# Patient Record
Sex: Male | Born: 1945 | Race: White | Hispanic: No | Smoking: Former smoker
Health system: Southern US, Community
[De-identification: ages and names within clinical notes are randomized; demographics above are authoritative.]

## PROBLEM LIST (undated history)

## (undated) DIAGNOSIS — M1990 Unspecified osteoarthritis, unspecified site: Secondary | ICD-10-CM

## (undated) DIAGNOSIS — I2510 Atherosclerotic heart disease of native coronary artery without angina pectoris: Secondary | ICD-10-CM

## (undated) DIAGNOSIS — M109 Gout, unspecified: Secondary | ICD-10-CM

## (undated) DIAGNOSIS — I509 Heart failure, unspecified: Secondary | ICD-10-CM

## (undated) DIAGNOSIS — I219 Acute myocardial infarction, unspecified: Secondary | ICD-10-CM

## (undated) DIAGNOSIS — I10 Essential (primary) hypertension: Secondary | ICD-10-CM

## (undated) DIAGNOSIS — E785 Hyperlipidemia, unspecified: Secondary | ICD-10-CM

## (undated) DIAGNOSIS — Z972 Presence of dental prosthetic device (complete) (partial): Secondary | ICD-10-CM

## (undated) DIAGNOSIS — N183 Chronic kidney disease, stage 3 unspecified: Secondary | ICD-10-CM

## (undated) HISTORY — DX: Essential (primary) hypertension: I10

## (undated) HISTORY — DX: Acute myocardial infarction, unspecified: I21.9

## (undated) HISTORY — DX: Hyperlipidemia, unspecified: E78.5

## (undated) HISTORY — PX: JOINT REPLACEMENT: SHX530

---

## 1998-03-15 DIAGNOSIS — I219 Acute myocardial infarction, unspecified: Secondary | ICD-10-CM

## 1998-03-15 HISTORY — DX: Acute myocardial infarction, unspecified: I21.9

## 1998-03-15 HISTORY — PX: CARDIAC CATHETERIZATION: SHX172

## 2001-03-15 DIAGNOSIS — G459 Transient cerebral ischemic attack, unspecified: Secondary | ICD-10-CM

## 2001-03-15 HISTORY — DX: Transient cerebral ischemic attack, unspecified: G45.9

## 2006-04-06 ENCOUNTER — Ambulatory Visit: Payer: Self-pay | Admitting: Gastroenterology

## 2007-10-27 ENCOUNTER — Ambulatory Visit: Payer: Self-pay | Admitting: *Deleted

## 2007-12-20 ENCOUNTER — Encounter: Payer: Self-pay | Admitting: Internal Medicine

## 2008-01-14 ENCOUNTER — Encounter: Payer: Self-pay | Admitting: Internal Medicine

## 2008-02-13 ENCOUNTER — Encounter: Payer: Self-pay | Admitting: Internal Medicine

## 2008-03-15 ENCOUNTER — Encounter: Payer: Self-pay | Admitting: Internal Medicine

## 2009-03-15 HISTORY — PX: COLONOSCOPY: SHX174

## 2011-01-17 ENCOUNTER — Ambulatory Visit: Payer: Self-pay | Admitting: Family Medicine

## 2011-01-17 ENCOUNTER — Ambulatory Visit: Payer: Self-pay | Admitting: Internal Medicine

## 2011-01-22 ENCOUNTER — Ambulatory Visit: Payer: Self-pay | Admitting: Urology

## 2011-01-25 DIAGNOSIS — N183 Chronic kidney disease, stage 3 unspecified: Secondary | ICD-10-CM | POA: Insufficient documentation

## 2011-01-29 ENCOUNTER — Ambulatory Visit: Payer: Self-pay | Admitting: Urology

## 2011-02-01 ENCOUNTER — Ambulatory Visit: Payer: Self-pay | Admitting: Urology

## 2011-02-08 ENCOUNTER — Ambulatory Visit: Payer: Self-pay | Admitting: Urology

## 2011-03-01 ENCOUNTER — Ambulatory Visit: Payer: Self-pay | Admitting: Urology

## 2012-09-19 DIAGNOSIS — L905 Scar conditions and fibrosis of skin: Secondary | ICD-10-CM | POA: Diagnosis not present

## 2012-09-19 DIAGNOSIS — Z85828 Personal history of other malignant neoplasm of skin: Secondary | ICD-10-CM | POA: Diagnosis not present

## 2012-09-29 DIAGNOSIS — G458 Other transient cerebral ischemic attacks and related syndromes: Secondary | ICD-10-CM | POA: Diagnosis not present

## 2012-09-29 DIAGNOSIS — R011 Cardiac murmur, unspecified: Secondary | ICD-10-CM | POA: Diagnosis not present

## 2012-09-29 DIAGNOSIS — I209 Angina pectoris, unspecified: Secondary | ICD-10-CM | POA: Diagnosis not present

## 2012-09-29 DIAGNOSIS — I2510 Atherosclerotic heart disease of native coronary artery without angina pectoris: Secondary | ICD-10-CM | POA: Diagnosis not present

## 2012-12-01 DIAGNOSIS — E669 Obesity, unspecified: Secondary | ICD-10-CM | POA: Diagnosis not present

## 2012-12-01 DIAGNOSIS — Z0000 Encounter for general adult medical examination without abnormal findings: Secondary | ICD-10-CM | POA: Diagnosis not present

## 2012-12-01 DIAGNOSIS — E785 Hyperlipidemia, unspecified: Secondary | ICD-10-CM | POA: Diagnosis not present

## 2012-12-01 DIAGNOSIS — I10 Essential (primary) hypertension: Secondary | ICD-10-CM | POA: Diagnosis not present

## 2012-12-01 DIAGNOSIS — Z23 Encounter for immunization: Secondary | ICD-10-CM | POA: Diagnosis not present

## 2012-12-01 DIAGNOSIS — N400 Enlarged prostate without lower urinary tract symptoms: Secondary | ICD-10-CM | POA: Diagnosis not present

## 2012-12-01 DIAGNOSIS — Z79899 Other long term (current) drug therapy: Secondary | ICD-10-CM | POA: Diagnosis not present

## 2013-03-06 DIAGNOSIS — M1710 Unilateral primary osteoarthritis, unspecified knee: Secondary | ICD-10-CM | POA: Diagnosis not present

## 2013-03-19 DIAGNOSIS — Z85828 Personal history of other malignant neoplasm of skin: Secondary | ICD-10-CM | POA: Diagnosis not present

## 2013-03-19 DIAGNOSIS — Z1283 Encounter for screening for malignant neoplasm of skin: Secondary | ICD-10-CM | POA: Diagnosis not present

## 2013-03-19 DIAGNOSIS — C44319 Basal cell carcinoma of skin of other parts of face: Secondary | ICD-10-CM | POA: Diagnosis not present

## 2013-03-19 DIAGNOSIS — D485 Neoplasm of uncertain behavior of skin: Secondary | ICD-10-CM | POA: Diagnosis not present

## 2013-03-19 DIAGNOSIS — L570 Actinic keratosis: Secondary | ICD-10-CM | POA: Diagnosis not present

## 2013-04-11 DIAGNOSIS — C44319 Basal cell carcinoma of skin of other parts of face: Secondary | ICD-10-CM | POA: Diagnosis not present

## 2013-04-11 DIAGNOSIS — C4401 Basal cell carcinoma of skin of lip: Secondary | ICD-10-CM | POA: Diagnosis not present

## 2013-05-30 DIAGNOSIS — Z48817 Encounter for surgical aftercare following surgery on the skin and subcutaneous tissue: Secondary | ICD-10-CM | POA: Diagnosis not present

## 2013-05-30 DIAGNOSIS — L570 Actinic keratosis: Secondary | ICD-10-CM | POA: Diagnosis not present

## 2013-06-28 DIAGNOSIS — J019 Acute sinusitis, unspecified: Secondary | ICD-10-CM | POA: Diagnosis not present

## 2013-09-07 ENCOUNTER — Ambulatory Visit: Payer: Self-pay | Admitting: Urology

## 2013-09-07 DIAGNOSIS — E291 Testicular hypofunction: Secondary | ICD-10-CM | POA: Diagnosis not present

## 2013-09-07 DIAGNOSIS — N400 Enlarged prostate without lower urinary tract symptoms: Secondary | ICD-10-CM | POA: Diagnosis not present

## 2013-09-07 DIAGNOSIS — N200 Calculus of kidney: Secondary | ICD-10-CM | POA: Diagnosis not present

## 2013-09-07 DIAGNOSIS — Z87442 Personal history of urinary calculi: Secondary | ICD-10-CM | POA: Diagnosis not present

## 2013-09-07 DIAGNOSIS — R109 Unspecified abdominal pain: Secondary | ICD-10-CM | POA: Diagnosis not present

## 2013-09-13 DIAGNOSIS — E291 Testicular hypofunction: Secondary | ICD-10-CM | POA: Diagnosis not present

## 2013-09-13 DIAGNOSIS — N200 Calculus of kidney: Secondary | ICD-10-CM | POA: Diagnosis not present

## 2013-09-28 DIAGNOSIS — I10 Essential (primary) hypertension: Secondary | ICD-10-CM | POA: Diagnosis not present

## 2013-09-28 DIAGNOSIS — I2510 Atherosclerotic heart disease of native coronary artery without angina pectoris: Secondary | ICD-10-CM | POA: Diagnosis not present

## 2013-09-28 DIAGNOSIS — I739 Peripheral vascular disease, unspecified: Secondary | ICD-10-CM | POA: Diagnosis not present

## 2013-09-28 DIAGNOSIS — E669 Obesity, unspecified: Secondary | ICD-10-CM | POA: Diagnosis not present

## 2013-10-11 DIAGNOSIS — E291 Testicular hypofunction: Secondary | ICD-10-CM | POA: Diagnosis not present

## 2013-11-09 DIAGNOSIS — E291 Testicular hypofunction: Secondary | ICD-10-CM | POA: Diagnosis not present

## 2013-11-24 DIAGNOSIS — Z23 Encounter for immunization: Secondary | ICD-10-CM | POA: Diagnosis not present

## 2014-02-12 DIAGNOSIS — M25561 Pain in right knee: Secondary | ICD-10-CM | POA: Diagnosis not present

## 2014-02-12 DIAGNOSIS — M1711 Unilateral primary osteoarthritis, right knee: Secondary | ICD-10-CM | POA: Diagnosis not present

## 2014-02-15 DIAGNOSIS — I25810 Atherosclerosis of coronary artery bypass graft(s) without angina pectoris: Secondary | ICD-10-CM | POA: Diagnosis not present

## 2014-02-15 DIAGNOSIS — I10 Essential (primary) hypertension: Secondary | ICD-10-CM | POA: Diagnosis not present

## 2014-02-15 DIAGNOSIS — E784 Other hyperlipidemia: Secondary | ICD-10-CM | POA: Diagnosis not present

## 2014-02-22 DIAGNOSIS — I25719 Atherosclerosis of autologous vein coronary artery bypass graft(s) with unspecified angina pectoris: Secondary | ICD-10-CM | POA: Diagnosis not present

## 2014-02-22 DIAGNOSIS — G459 Transient cerebral ischemic attack, unspecified: Secondary | ICD-10-CM | POA: Diagnosis not present

## 2014-03-15 HISTORY — PX: KNEE ARTHROSCOPY: SHX127

## 2014-03-26 DIAGNOSIS — M1711 Unilateral primary osteoarthritis, right knee: Secondary | ICD-10-CM | POA: Insufficient documentation

## 2014-03-27 ENCOUNTER — Ambulatory Visit: Payer: Self-pay | Admitting: General Practice

## 2014-03-27 DIAGNOSIS — M1711 Unilateral primary osteoarthritis, right knee: Secondary | ICD-10-CM | POA: Diagnosis not present

## 2014-03-27 DIAGNOSIS — Z01812 Encounter for preprocedural laboratory examination: Secondary | ICD-10-CM | POA: Diagnosis not present

## 2014-03-27 LAB — BASIC METABOLIC PANEL
Anion Gap: 5 — ABNORMAL LOW (ref 7–16)
BUN: 25 mg/dL — ABNORMAL HIGH (ref 7–18)
CHLORIDE: 111 mmol/L — AB (ref 98–107)
CO2: 27 mmol/L (ref 21–32)
CREATININE: 2.14 mg/dL — AB (ref 0.60–1.30)
Calcium, Total: 8.7 mg/dL (ref 8.5–10.1)
EGFR (Non-African Amer.): 33 — ABNORMAL LOW
GFR CALC AF AMER: 40 — AB
Glucose: 92 mg/dL (ref 65–99)
OSMOLALITY: 289 (ref 275–301)
Potassium: 5 mmol/L (ref 3.5–5.1)
Sodium: 143 mmol/L (ref 136–145)

## 2014-03-27 LAB — URINALYSIS, COMPLETE
Bacteria: NONE SEEN
Bilirubin,UR: NEGATIVE
Blood: NEGATIVE
Glucose,UR: NEGATIVE mg/dL (ref 0–75)
KETONE: NEGATIVE
Leukocyte Esterase: NEGATIVE
Nitrite: NEGATIVE
PROTEIN: NEGATIVE
Ph: 5 (ref 4.5–8.0)
RBC,UR: 1 /HPF (ref 0–5)
SPECIFIC GRAVITY: 1.014 (ref 1.003–1.030)
SQUAMOUS EPITHELIAL: NONE SEEN

## 2014-03-27 LAB — CBC
HCT: 47.3 % (ref 40.0–52.0)
HGB: 15.3 g/dL (ref 13.0–18.0)
MCH: 29.2 pg (ref 26.0–34.0)
MCHC: 32.3 g/dL (ref 32.0–36.0)
MCV: 91 fL (ref 80–100)
Platelet: 206 10*3/uL (ref 150–440)
RBC: 5.23 10*6/uL (ref 4.40–5.90)
RDW: 13.9 % (ref 11.5–14.5)
WBC: 6.9 10*3/uL (ref 3.8–10.6)

## 2014-03-27 LAB — SEDIMENTATION RATE: ERYTHROCYTE SED RATE: 7 mm/h (ref 0–20)

## 2014-03-27 LAB — APTT: ACTIVATED PTT: 27.7 s (ref 23.6–35.9)

## 2014-03-27 LAB — PROTIME-INR
INR: 1
Prothrombin Time: 13.3 secs (ref 11.5–14.7)

## 2014-03-27 LAB — MRSA PCR SCREENING

## 2014-03-29 LAB — URINE CULTURE

## 2014-04-09 DIAGNOSIS — Z955 Presence of coronary angioplasty implant and graft: Secondary | ICD-10-CM | POA: Insufficient documentation

## 2014-04-09 DIAGNOSIS — N183 Chronic kidney disease, stage 3 (moderate): Secondary | ICD-10-CM | POA: Diagnosis not present

## 2014-04-09 DIAGNOSIS — G4733 Obstructive sleep apnea (adult) (pediatric): Secondary | ICD-10-CM | POA: Diagnosis not present

## 2014-04-09 DIAGNOSIS — N200 Calculus of kidney: Secondary | ICD-10-CM | POA: Diagnosis not present

## 2014-05-21 ENCOUNTER — Ambulatory Visit: Payer: Self-pay | Admitting: General Practice

## 2014-05-21 DIAGNOSIS — Z01812 Encounter for preprocedural laboratory examination: Secondary | ICD-10-CM | POA: Diagnosis not present

## 2014-05-21 DIAGNOSIS — M1711 Unilateral primary osteoarthritis, right knee: Secondary | ICD-10-CM | POA: Diagnosis not present

## 2014-05-21 DIAGNOSIS — Z79899 Other long term (current) drug therapy: Secondary | ICD-10-CM | POA: Diagnosis not present

## 2014-05-21 DIAGNOSIS — M79661 Pain in right lower leg: Secondary | ICD-10-CM | POA: Diagnosis not present

## 2014-05-21 DIAGNOSIS — E669 Obesity, unspecified: Secondary | ICD-10-CM | POA: Diagnosis not present

## 2014-06-05 ENCOUNTER — Inpatient Hospital Stay: Payer: Self-pay | Admitting: General Practice

## 2014-06-05 DIAGNOSIS — Z7902 Long term (current) use of antithrombotics/antiplatelets: Secondary | ICD-10-CM | POA: Diagnosis not present

## 2014-06-05 DIAGNOSIS — M109 Gout, unspecified: Secondary | ICD-10-CM | POA: Diagnosis not present

## 2014-06-05 DIAGNOSIS — N183 Chronic kidney disease, stage 3 (moderate): Secondary | ICD-10-CM | POA: Diagnosis present

## 2014-06-05 DIAGNOSIS — Z7982 Long term (current) use of aspirin: Secondary | ICD-10-CM | POA: Diagnosis not present

## 2014-06-05 DIAGNOSIS — I4891 Unspecified atrial fibrillation: Secondary | ICD-10-CM | POA: Diagnosis not present

## 2014-06-05 DIAGNOSIS — I739 Peripheral vascular disease, unspecified: Secondary | ICD-10-CM | POA: Diagnosis not present

## 2014-06-05 DIAGNOSIS — Z955 Presence of coronary angioplasty implant and graft: Secondary | ICD-10-CM | POA: Diagnosis not present

## 2014-06-05 DIAGNOSIS — I493 Ventricular premature depolarization: Secondary | ICD-10-CM | POA: Diagnosis not present

## 2014-06-05 DIAGNOSIS — Z85828 Personal history of other malignant neoplasm of skin: Secondary | ICD-10-CM | POA: Diagnosis not present

## 2014-06-05 DIAGNOSIS — Z471 Aftercare following joint replacement surgery: Secondary | ICD-10-CM | POA: Diagnosis not present

## 2014-06-05 DIAGNOSIS — R0902 Hypoxemia: Secondary | ICD-10-CM | POA: Diagnosis not present

## 2014-06-05 DIAGNOSIS — M1711 Unilateral primary osteoarthritis, right knee: Secondary | ICD-10-CM | POA: Diagnosis not present

## 2014-06-05 DIAGNOSIS — D62 Acute posthemorrhagic anemia: Secondary | ICD-10-CM | POA: Diagnosis not present

## 2014-06-05 DIAGNOSIS — Z8673 Personal history of transient ischemic attack (TIA), and cerebral infarction without residual deficits: Secondary | ICD-10-CM | POA: Diagnosis not present

## 2014-06-05 DIAGNOSIS — I252 Old myocardial infarction: Secondary | ICD-10-CM | POA: Diagnosis not present

## 2014-06-05 DIAGNOSIS — G4733 Obstructive sleep apnea (adult) (pediatric): Secondary | ICD-10-CM | POA: Diagnosis not present

## 2014-06-05 DIAGNOSIS — I9789 Other postprocedural complications and disorders of the circulatory system, not elsewhere classified: Secondary | ICD-10-CM | POA: Diagnosis not present

## 2014-06-05 DIAGNOSIS — M21161 Varus deformity, not elsewhere classified, right knee: Secondary | ICD-10-CM | POA: Diagnosis present

## 2014-06-05 DIAGNOSIS — I10 Essential (primary) hypertension: Secondary | ICD-10-CM | POA: Diagnosis not present

## 2014-06-05 DIAGNOSIS — N23 Unspecified renal colic: Secondary | ICD-10-CM | POA: Diagnosis not present

## 2014-06-05 DIAGNOSIS — E669 Obesity, unspecified: Secondary | ICD-10-CM | POA: Diagnosis present

## 2014-06-05 DIAGNOSIS — K469 Unspecified abdominal hernia without obstruction or gangrene: Secondary | ICD-10-CM | POA: Diagnosis not present

## 2014-06-05 DIAGNOSIS — M6281 Muscle weakness (generalized): Secondary | ICD-10-CM | POA: Diagnosis not present

## 2014-06-05 DIAGNOSIS — R531 Weakness: Secondary | ICD-10-CM | POA: Diagnosis not present

## 2014-06-05 DIAGNOSIS — I2510 Atherosclerotic heart disease of native coronary artery without angina pectoris: Secondary | ICD-10-CM | POA: Diagnosis not present

## 2014-06-05 DIAGNOSIS — R2689 Other abnormalities of gait and mobility: Secondary | ICD-10-CM | POA: Diagnosis not present

## 2014-06-05 DIAGNOSIS — E785 Hyperlipidemia, unspecified: Secondary | ICD-10-CM | POA: Diagnosis not present

## 2014-06-05 DIAGNOSIS — Z6839 Body mass index (BMI) 39.0-39.9, adult: Secondary | ICD-10-CM | POA: Diagnosis not present

## 2014-06-05 DIAGNOSIS — Z96651 Presence of right artificial knee joint: Secondary | ICD-10-CM | POA: Diagnosis not present

## 2014-06-06 ENCOUNTER — Encounter
Admit: 2014-06-06 | Disposition: A | Discharge status: Home / Self Care | Payer: Self-pay | Attending: Internal Medicine | Admitting: Internal Medicine

## 2014-06-06 LAB — BASIC METABOLIC PANEL
ANION GAP: 6 — AB (ref 7–16)
BUN: 25 mg/dL — ABNORMAL HIGH
CREATININE: 1.6 mg/dL — AB
Calcium, Total: 8.4 mg/dL — ABNORMAL LOW
Chloride: 110 mmol/L
Co2: 25 mmol/L
GFR CALC AF AMER: 51 — AB
GFR CALC NON AF AMER: 44 — AB
Glucose: 121 mg/dL — ABNORMAL HIGH
Potassium: 4 mmol/L
Sodium: 141 mmol/L

## 2014-06-06 LAB — PLATELET COUNT: Platelet: 228 10*3/uL (ref 150–440)

## 2014-06-06 LAB — HEMOGLOBIN: HGB: 12.3 g/dL — ABNORMAL LOW (ref 13.0–18.0)

## 2014-06-07 LAB — BASIC METABOLIC PANEL WITH GFR
Anion Gap: 8
BUN: 19 mg/dL
Calcium, Total: 8.5 mg/dL — ABNORMAL LOW
Chloride: 105 mmol/L
Co2: 24 mmol/L
Creatinine: 1.63 mg/dL — ABNORMAL HIGH
EGFR (African American): 50 — ABNORMAL LOW
EGFR (Non-African Amer.): 43 — ABNORMAL LOW
Glucose: 102 mg/dL — ABNORMAL HIGH
Potassium: 3.9 mmol/L
Sodium: 137 mmol/L

## 2014-06-07 LAB — HEMOGLOBIN: HGB: 11.3 g/dL — AB (ref 13.0–18.0)

## 2014-06-07 LAB — PLATELET COUNT: Platelet: 199 x10 3/mm 3

## 2014-06-08 DIAGNOSIS — I2510 Atherosclerotic heart disease of native coronary artery without angina pectoris: Secondary | ICD-10-CM | POA: Diagnosis not present

## 2014-06-08 DIAGNOSIS — M109 Gout, unspecified: Secondary | ICD-10-CM | POA: Diagnosis not present

## 2014-06-08 DIAGNOSIS — R2689 Other abnormalities of gait and mobility: Secondary | ICD-10-CM | POA: Diagnosis not present

## 2014-06-08 DIAGNOSIS — Z471 Aftercare following joint replacement surgery: Secondary | ICD-10-CM | POA: Diagnosis not present

## 2014-06-08 DIAGNOSIS — G4733 Obstructive sleep apnea (adult) (pediatric): Secondary | ICD-10-CM | POA: Diagnosis not present

## 2014-06-08 DIAGNOSIS — I10 Essential (primary) hypertension: Secondary | ICD-10-CM | POA: Diagnosis not present

## 2014-06-08 DIAGNOSIS — Z7902 Long term (current) use of antithrombotics/antiplatelets: Secondary | ICD-10-CM | POA: Diagnosis not present

## 2014-06-08 DIAGNOSIS — Z96651 Presence of right artificial knee joint: Secondary | ICD-10-CM | POA: Diagnosis not present

## 2014-06-08 DIAGNOSIS — I493 Ventricular premature depolarization: Secondary | ICD-10-CM | POA: Diagnosis not present

## 2014-06-08 DIAGNOSIS — R0902 Hypoxemia: Secondary | ICD-10-CM | POA: Diagnosis not present

## 2014-06-08 DIAGNOSIS — N23 Unspecified renal colic: Secondary | ICD-10-CM | POA: Diagnosis not present

## 2014-06-08 DIAGNOSIS — M159 Polyosteoarthritis, unspecified: Secondary | ICD-10-CM | POA: Diagnosis not present

## 2014-06-08 DIAGNOSIS — R531 Weakness: Secondary | ICD-10-CM | POA: Diagnosis not present

## 2014-06-08 DIAGNOSIS — N179 Acute kidney failure, unspecified: Secondary | ICD-10-CM | POA: Diagnosis not present

## 2014-06-08 DIAGNOSIS — K469 Unspecified abdominal hernia without obstruction or gangrene: Secondary | ICD-10-CM | POA: Diagnosis not present

## 2014-06-08 DIAGNOSIS — Z8673 Personal history of transient ischemic attack (TIA), and cerebral infarction without residual deficits: Secondary | ICD-10-CM | POA: Diagnosis not present

## 2014-06-08 DIAGNOSIS — M6281 Muscle weakness (generalized): Secondary | ICD-10-CM | POA: Diagnosis not present

## 2014-06-08 DIAGNOSIS — I739 Peripheral vascular disease, unspecified: Secondary | ICD-10-CM | POA: Diagnosis not present

## 2014-06-08 DIAGNOSIS — I4891 Unspecified atrial fibrillation: Secondary | ICD-10-CM | POA: Diagnosis not present

## 2014-06-08 DIAGNOSIS — R000 Tachycardia, unspecified: Secondary | ICD-10-CM | POA: Diagnosis not present

## 2014-06-08 DIAGNOSIS — I252 Old myocardial infarction: Secondary | ICD-10-CM | POA: Diagnosis not present

## 2014-06-08 DIAGNOSIS — E785 Hyperlipidemia, unspecified: Secondary | ICD-10-CM | POA: Diagnosis not present

## 2014-06-08 DIAGNOSIS — R509 Fever, unspecified: Secondary | ICD-10-CM | POA: Diagnosis not present

## 2014-06-10 DIAGNOSIS — M109 Gout, unspecified: Secondary | ICD-10-CM | POA: Diagnosis not present

## 2014-06-10 DIAGNOSIS — N179 Acute kidney failure, unspecified: Secondary | ICD-10-CM | POA: Diagnosis not present

## 2014-06-10 DIAGNOSIS — I2510 Atherosclerotic heart disease of native coronary artery without angina pectoris: Secondary | ICD-10-CM | POA: Diagnosis not present

## 2014-06-10 DIAGNOSIS — M159 Polyosteoarthritis, unspecified: Secondary | ICD-10-CM | POA: Diagnosis not present

## 2014-06-11 LAB — CBC WITH DIFFERENTIAL/PLATELET
Basophil #: 0 10*3/uL (ref 0.0–0.1)
Basophil %: 0.4 %
EOS PCT: 0.4 %
Eosinophil #: 0 10*3/uL (ref 0.0–0.7)
HCT: 28.9 % — ABNORMAL LOW (ref 40.0–52.0)
HGB: 9.3 g/dL — AB (ref 13.0–18.0)
Lymphocyte #: 1.4 10*3/uL (ref 1.0–3.6)
Lymphocyte %: 12.8 %
MCH: 28.8 pg (ref 26.0–34.0)
MCHC: 32.3 g/dL (ref 32.0–36.0)
MCV: 89 fL (ref 80–100)
MONO ABS: 1.9 x10 3/mm — AB (ref 0.2–1.0)
MONOS PCT: 16.4 %
NEUTROS PCT: 70 %
Neutrophil #: 7.9 10*3/uL — ABNORMAL HIGH (ref 1.4–6.5)
Platelet: 247 10*3/uL (ref 150–440)
RBC: 3.24 10*6/uL — AB (ref 4.40–5.90)
RDW: 13.3 % (ref 11.5–14.5)
WBC: 11.3 10*3/uL — ABNORMAL HIGH (ref 3.8–10.6)

## 2014-06-11 LAB — URINALYSIS, COMPLETE
BLOOD: NEGATIVE
Bilirubin,UR: NEGATIVE
GLUCOSE, UR: NEGATIVE mg/dL (ref 0–75)
Ketone: NEGATIVE
Leukocyte Esterase: NEGATIVE
Nitrite: NEGATIVE
PROTEIN: NEGATIVE
Ph: 5 (ref 4.5–8.0)
SPECIFIC GRAVITY: 1.017 (ref 1.003–1.030)
WBC UR: 2 /HPF (ref 0–5)

## 2014-06-12 LAB — COMPREHENSIVE METABOLIC PANEL
ALT: 25 U/L
AST: 37 U/L
Albumin: 2.4 g/dL — ABNORMAL LOW
Alkaline Phosphatase: 69 U/L
Anion Gap: 15 (ref 7–16)
BUN: 88 mg/dL — AB
Bilirubin,Total: 1.1 mg/dL
CALCIUM: 8.4 mg/dL — AB
Chloride: 100 mmol/L — ABNORMAL LOW
Co2: 19 mmol/L — ABNORMAL LOW
Creatinine: 4.2 mg/dL — ABNORMAL HIGH
EGFR (African American): 16 — ABNORMAL LOW
EGFR (Non-African Amer.): 14 — ABNORMAL LOW
GLUCOSE: 118 mg/dL — AB
POTASSIUM: 2.9 mmol/L — AB
SODIUM: 134 mmol/L — AB
Total Protein: 6.7 g/dL

## 2014-06-12 LAB — CBC WITH DIFFERENTIAL/PLATELET
BASOS PCT: 0.3 %
Basophil #: 0 10*3/uL (ref 0.0–0.1)
EOS ABS: 0.1 10*3/uL (ref 0.0–0.7)
EOS PCT: 1.3 %
HCT: 29.8 % — AB (ref 40.0–52.0)
HGB: 9.7 g/dL — AB (ref 13.0–18.0)
LYMPHS PCT: 11.8 %
Lymphocyte #: 1.3 10*3/uL (ref 1.0–3.6)
MCH: 28.8 pg (ref 26.0–34.0)
MCHC: 32.7 g/dL (ref 32.0–36.0)
MCV: 88 fL (ref 80–100)
Monocyte #: 1.4 x10 3/mm — ABNORMAL HIGH (ref 0.2–1.0)
Monocyte %: 13 %
NEUTROS ABS: 7.9 10*3/uL — AB (ref 1.4–6.5)
Neutrophil %: 73.6 %
Platelet: 294 10*3/uL (ref 150–440)
RBC: 3.38 10*6/uL — AB (ref 4.40–5.90)
RDW: 13.4 % (ref 11.5–14.5)
WBC: 10.8 10*3/uL — ABNORMAL HIGH (ref 3.8–10.6)

## 2014-06-12 LAB — URINE CULTURE

## 2014-06-12 LAB — MAGNESIUM: Magnesium: 2.6 mg/dL — ABNORMAL HIGH

## 2014-06-12 LAB — TSH: THYROID STIMULATING HORM: 3.423 u[IU]/mL

## 2014-06-12 LAB — URIC ACID: Uric Acid: 14.1 mg/dL — ABNORMAL HIGH

## 2014-06-13 LAB — COMPREHENSIVE METABOLIC PANEL
ALBUMIN: 2.4 g/dL — AB
ALK PHOS: 70 U/L
AST: 28 U/L
Anion Gap: 13 (ref 7–16)
BUN: 92 mg/dL — ABNORMAL HIGH
Bilirubin,Total: 0.7 mg/dL
Calcium, Total: 8.3 mg/dL — ABNORMAL LOW
Chloride: 102 mmol/L
Co2: 20 mmol/L — ABNORMAL LOW
Creatinine: 3.29 mg/dL — ABNORMAL HIGH
EGFR (African American): 21 — ABNORMAL LOW
EGFR (Non-African Amer.): 19 — ABNORMAL LOW
Glucose: 163 mg/dL — ABNORMAL HIGH
Potassium: 2.9 mmol/L — ABNORMAL LOW
SGPT (ALT): 23 U/L
Sodium: 135 mmol/L
Total Protein: 6.7 g/dL

## 2014-06-13 LAB — CBC WITH DIFFERENTIAL/PLATELET
BASOS PCT: 0.1 %
Basophil #: 0 10*3/uL (ref 0.0–0.1)
EOS PCT: 0.1 %
Eosinophil #: 0 10*3/uL (ref 0.0–0.7)
HCT: 29.6 % — ABNORMAL LOW (ref 40.0–52.0)
HGB: 9.8 g/dL — AB (ref 13.0–18.0)
LYMPHS ABS: 0.8 10*3/uL — AB (ref 1.0–3.6)
Lymphocyte %: 8.5 %
MCH: 29.3 pg (ref 26.0–34.0)
MCHC: 33.2 g/dL (ref 32.0–36.0)
MCV: 88 fL (ref 80–100)
Monocyte #: 0.6 x10 3/mm (ref 0.2–1.0)
Monocyte %: 6.8 %
Neutrophil #: 7.6 10*3/uL — ABNORMAL HIGH (ref 1.4–6.5)
Neutrophil %: 84.5 %
Platelet: 329 10*3/uL (ref 150–440)
RBC: 3.35 10*6/uL — ABNORMAL LOW (ref 4.40–5.90)
RDW: 13.3 % (ref 11.5–14.5)
WBC: 9 10*3/uL (ref 3.8–10.6)

## 2014-06-13 LAB — BASIC METABOLIC PANEL
Anion Gap: 12 (ref 7–16)
BUN: 88 mg/dL — ABNORMAL HIGH
CALCIUM: 8.4 mg/dL — AB
CREATININE: 3.02 mg/dL — AB
Chloride: 104 mmol/L
Co2: 20 mmol/L — ABNORMAL LOW
EGFR (Non-African Amer.): 21 — ABNORMAL LOW
GFR CALC AF AMER: 24 — AB
GLUCOSE: 129 mg/dL — AB
Potassium: 2.9 mmol/L — ABNORMAL LOW
SODIUM: 136 mmol/L

## 2014-06-14 ENCOUNTER — Encounter
Admit: 2014-06-14 | Disposition: A | Discharge status: Home / Self Care | Payer: Self-pay | Attending: Internal Medicine | Admitting: Internal Medicine

## 2014-06-14 LAB — BASIC METABOLIC PANEL
ANION GAP: 10 (ref 7–16)
BUN: 86 mg/dL — ABNORMAL HIGH
CALCIUM: 8.5 mg/dL — AB
CO2: 19 mmol/L — AB
Chloride: 110 mmol/L
Creatinine: 2.56 mg/dL — ABNORMAL HIGH
EGFR (Non-African Amer.): 25 — ABNORMAL LOW
GFR CALC AF AMER: 29 — AB
Glucose: 145 mg/dL — ABNORMAL HIGH
Potassium: 3 mmol/L — ABNORMAL LOW
SODIUM: 139 mmol/L

## 2014-06-18 LAB — BASIC METABOLIC PANEL
ANION GAP: 8 (ref 7–16)
BUN: 48 mg/dL — ABNORMAL HIGH
CALCIUM: 8.8 mg/dL — AB
CHLORIDE: 114 mmol/L — AB
Co2: 19 mmol/L — ABNORMAL LOW
Creatinine: 1.91 mg/dL — ABNORMAL HIGH
GFR CALC AF AMER: 41 — AB
GFR CALC NON AF AMER: 36 — AB
Glucose: 94 mg/dL
Potassium: 4.1 mmol/L
Sodium: 141 mmol/L

## 2014-06-20 DIAGNOSIS — Z96651 Presence of right artificial knee joint: Secondary | ICD-10-CM | POA: Insufficient documentation

## 2014-07-09 DIAGNOSIS — Z471 Aftercare following joint replacement surgery: Secondary | ICD-10-CM | POA: Diagnosis not present

## 2014-07-09 DIAGNOSIS — Z96651 Presence of right artificial knee joint: Secondary | ICD-10-CM | POA: Diagnosis not present

## 2014-07-12 DIAGNOSIS — Z471 Aftercare following joint replacement surgery: Secondary | ICD-10-CM | POA: Diagnosis not present

## 2014-07-12 DIAGNOSIS — Z96651 Presence of right artificial knee joint: Secondary | ICD-10-CM | POA: Diagnosis not present

## 2014-07-14 NOTE — Consult Note (Signed)
Brief Consult Note: Diagnosis: 69 yr old male with  right total knee replacement,with PVC.   Consult note dictated.   Comments: 69 yr old male with CAD s/p PCI  ,s/p Right TKR with PVC;likley due to  untreated OSA/anesthesia/elctrolyte abnormalities; check stat k,mg,replace as necessary 2.htn/cad;resume bblcoker(lower dose),tricor mild hypoxia 02 sats 92 on 4litres;start cpap,chest xray today continue to hold plavix today,resume when ok  with ortho.  Electronic Signatures: Epifanio Lesches (MD)  (Signed 23-Mar-16 18:17)  Authored: Brief Consult Note   Last Updated: 23-Mar-16 18:17 by Epifanio Lesches (MD)

## 2014-07-14 NOTE — Consult Note (Signed)
PATIENT NAME:  Christopher [name], Christopher [name] MR#:  [id number] DATE OF BIRTH:  [birth year]  DATE OF CONSULTATION:  06/05/2014  REFERRING PHYSICIAN:  Laurice Record. Holley Bouche., MD CONSULTING PHYSICIAN:  Epifanio Lesches, MD  REASON FOR CONSULTATION: PVCs.   HISTORY OF PRESENT ILLNESS: The patient is a 69 year old male who had a right total knee replacement today and the patient developed multifocal PVCs in recovery. The patient's blood pressure is 140/70, heart rate 72, has multifocal PVCs. The patient is asymptomatic, resting comfortably, able to answer questions appropriately. Denies any chest pain or trouble breathing.   PAST MEDICAL HISTORY: Significant for hypertension, coronary artery disease, and hyperlipidemia. The patient has a history of peripheral vascular disease, obstructive sleep apnea, history of CVA, degenerative joint disease, history of PCI cardiac stent in 2001.   PAST SURGICAL HISTORY: Significant for right carotid stent, urinary stent, hernia repair, and cardiac stents in 2001.   HOME MEDICATIONS: Acetaminophen 500 mg 2 tablets every 6 hours as needed, amlodipine 5 mg p.o. daily, etodolac 500 mg p.o. b.i.d., Toprol XL 100 mg p.o. daily, niacin 500 mg p.o. daily, Plavix 75 mg p.o. daily milligrams daily, ramipril 10 mg daily, TriCor 145 mg p.o. at bedtime.   ALLERGIES: No known allergies.   FAMILY HISTORY: Significant for breast cancer to the mother, colon cancer to the father, hypertension for a father, heart attack for father, asthma for mother.   SOCIAL HISTORY: Quit smoking. The patient has no drinking or drugs.   PREOPERATIVE STUDIES: The patient had preoperative cardiac evaluation by Dr. Clayborn Bigness and he had a Lexiscan stress test, which showed EF of 36% with no reversible defects and he is cleared for surgery.   REVIEW OF SYSTEMS: Denies any chest pain or trouble breathing. No abdominal pain. No headache. No blurred vision. No history of weakness in the hands.   PHYSICAL EXAMINATION:   VITAL SIGNS: Temperature 98.5, heart rate 78, blood pressure 140/80, saturations 94% on 2 L.  GENERAL: He is awake and oriented,  not in distress.  HEAD: Atraumatic, normocephalic.  EYES: Pupils equal, reacting to light. Extraocular movements intact.  EAR, NOSE, AND THROAT: No tympanic membranes congestion. No turbinate hypertrophy. No oropharyngeal erythema.   NECK: Supple. No JVD. No carotid bruit. Carotid pulses present bilaterally.  LUNGS: Bilaterally clear to auscultation. No wheezing. No rales. Not using accessory muscles of respiration.  CARDIOVASCULAR: S1, S2 regular. No murmurs. Does have occasional PVCs on the monitor. Has no gallops,  no murmurs. Pulses are equal all arteries bilaterally and dorsalis pedis.  ABDOMEN: Soft, nontender, nondistended. Bowel sounds present.  EXTREMITIES: The patient right leg is wrapped up, had right knee surgery with total knee arthroplasty. Left leg no cyanosis, no clubbing, no edema. Peripheral pulses 2+ in all extremities. The right side the patient has a dressing from thigh to the ankle, so could not feel the pulse that side.  NEUROLOGIC: Awake and oriented x 3. No focal neurological deficit.  PSYCHIATRIC: Motor and affect are within normal limits.   LABORATORY DATA: The patient's EKG showed multifocal PVCs and rhythm strips showed a lot of PVCs with heart rate 72 beats per minute.   ASSESSMENT AND PLAN:  1.  The patient is a 69 year old male with history of coronary artery disease and stent placement, has right knee replacement with multifocal premature ventricular contractions. Monitor him on telemetry. Check his magnesium and potassium and replace them accordingly. The patient's primary cardiologist is Dr. Clayborn Bigness; we will consult them regarding his coronary artery disease.  He will be on metoprolol succinate 100 mg and also continue that.  check echo 2.  History of coronary artery disease. He is on ramipril and simvastatin. Continue them.  3.   Regarding history of stent, he was on Plavix which was stopped 5 days ago. Continue to hold the Plavix and resume when it is okay with orthopedic. The patient had a stent placed in more than one year.  3.  Regarding sleep apnea, he has CPAP at night. He needs sleep studies as an outpatient and that is still pending, but empirically we can start him on CPAP at night.   PRIMARY CARE PHYSICIAN: Otilio Miu, MD   We will follow the patient along.   TIME SPENT: 55 minutes.    ____________________________ Epifanio Lesches, MD sk:bm D: 06/05/2014 18:05:00 ET T: 06/05/2014 22:54:57 ET JOB#: [id number]  cc: Epifanio Lesches, MD, <Dictator> Juline Patch, MD Dwayne D. Clayborn Bigness, MD  Epifanio Lesches MD ELECTRONICALLY SIGNED 06/16/2014 16:12

## 2014-07-14 NOTE — Op Note (Signed)
PATIENT NAME:  Christopher [name], Christopher [name] MR#:  [id number] DATE OF BIRTH:  [birth year]  DATE OF PROCEDURE:  06/05/2014  PREOPERATIVE DIAGNOSIS: Degenerative arthrosis of the right knee (primary).   POSTOPERATIVE DIAGNOSIS: Degenerative arthrosis of the right knee (primary).   PROCEDURE PERFORMED: Right total knee arthroplasty using computer-assisted navigation.   SURGEON: Skip Estimable, M.D.   ASSISTANT: Vance Peper, PA (required to maintain retraction throughout the procedure).   ANESTHESIA: General.   ESTIMATED BLOOD LOSS: 50 mL.   FLUIDS REPLACED: 1850 mL of crystalloid.   TOURNIQUET TIME: 136 minutes.   DRAINS: Two medium drains to reinfusion system.   SOFT TISSUE RELEASES:  Anterior cruciate ligament, posterior cruciate ligament, deep and superficial medial collateral ligament, and patellofemoral ligament.   IMPLANTS UTILIZED: DePuy PFC Sigma size 6 posterior stabilized femoral component (cemented), size 6 MBT tibial component (cemented), 41 mm 3 peg oval dome patella (cemented), and a 10 mm stabilized rotating platform polyethylene insert.   INDICATIONS FOR PROCEDURE:  The patient is a 69 year old male who has been seen for complaints of progressive right knee pain. X-rays demonstrated severe degenerative changes in tricompartmental fashion with relative varus deformity. After discussion of the risks and benefits of surgical intervention, the patient expressed understanding of the risks and benefits, and agreed with plans for surgical intervention.   PROCEDURE IN DETAIL: The patient was brought into the operating room, and after adequate general anesthesia was achieved, a tourniquet was placed on the patient's upper right thigh. The patient's right knee and leg were cleaned and prepped with alcohol and DuraPrep, draped in the usual sterile fashion. A "timeout" was performed as per usual protocol. The right lower extremity was exsanguinated using an Esmarch, and the tourniquet was inflated to 300  mmHg. An anterior longitudinal incision was made followed by a standard mid vastus approach. A moderate effusion was evacuated. The deep fibers of the medial collateral ligament were elevated in a subperiosteal fashion off the medial flare of the tibia so as to maintain a continuous soft tissue sleeve. The patella was subluxed laterally and the patellofemoral ligament was incised. Inspection of the knee demonstrated severe degenerative changes in a tricompartmental fashion with full-thickness loss of articular cartilage noted medially. Prominent osteophytes were debrided using a rongeur. Anterior and posterior cruciate ligaments were excised. Two 4.0 mm Schanz pins were inserted into the femur and into the tibia for attachment of the ray of trackers used for computer-assisted navigation. Hip center was identified using circumduction technique. Distal landmarks were mapped using the computer. The distal femur and proximal tibia were mapped using the computer. Distal femoral cutting guide was positioned using computer-assisted navigation so as to achieve a 5 degrees distal valgus cut. Cut was performed and verified using the computer. Distal femur was sized and it was felt that a size 6 femoral component was appropriate. A size 6 cutting guide was positioned and an anterior cut was performed and verified using the computer. This was followed by completion of the posterior and chamfer cuts. Femoral cutting guide for the central box was then positioned and the central box cut was performed. Attention was then then directed to the proximal tibia. Medial and lateral menisci were excised. The extramedullary tibial cutting guide was positioned using computer-assisted navigation so as to achieve 0 degrees varus valgus alignment and 0 degree posterior slope. Cut was performed and verified using the computer. The proximal tibia was sized and it was felt that a size 6 tibial tray was appropriate. Femoral and tibial trials  were  inserted. Curved osteotomes were used to remove prominent posterior osteophytes off the femur. A 10 mm polyethylene trial was inserted and the knee was placed through a range of motion. The knee was felt to be tight medially. A Cobb elevator was used to elevate the superficial fibers of the medial collateral ligament. This allowed for excellent mediolateral soft tissue balancing both in full extension and in flexion. Finally, the patella was cut and prepared so as to accommodate a 41 mm 3 peg oval dome patella. Patellar trial was placed and the knee was placed through a range of motion with excellent patellar tracking appreciated. The trial components were removed. The cut surfaces of bone were irrigated with copious amounts of normal saline with antibiotic solution using pulsatile lavage and then suctioned dry. Polymethyl methacrylate cement with gentamicin was prepared in the usual fashion using a vacuum mixer. Cement was applied to the cut surface of the tibia as well as along the undersurface of a size 6 MBT tibial component. The tibial component was positioned and impacted into place. Excess cement was removed using [employer] fast streamer. Cement was then applied to the cut surface of the femur as well as along the posterior flanges of a size 6 posterior stabilized femoral component. Femoral component was positioned and impacted into place. Excess cement was removed using [employer] fast streamer. A 10 mm polyethylene trial was inserted and the knee was brought into full extension with steady axial compression applied. Finally, cement was applied to the backside of a 41 mm 3 peg oval dome patella, and the patella component was positioned, and patellar clamp applied. Excess cement was removed using [employer] fast streamer.   After adequate curing of cement, the tourniquet was deflated after a total tourniquet time of 136 minutes. Hemostasis was achieved using electrocautery. The knee was irrigated with copious amounts of normal  saline with antibiotic solution using pulsatile lavage and then suctioned dry. The knee was inspected for any residual cement debris. Twenty mL of 1.3% Exparel in 40 mL of normal saline was injected along the posterior capsule, medial and lateral gutters, and along the arthrotomy site. A 10 mm stabilized rotating platform polyethylene insert was inserted and the knee was placed through a range of motion with excellent patellar tracking appreciated and excellent mediolateral soft tissue balancing. Two medium drains were placed in the wound bed and brought out through a separate stab incision to be attached to a reinfusion system. The medial parapatellar portion of the incision was reapproximated using interrupted sutures of #1 Vicryl. The subcutaneous tissue was approximated in layers using first #0 Vicryl followed by #2-0 Vicryl. Thirty mL of 0.25% Marcaine with epinephrine was injected in the subcutaneous tissue in line with the skin incision. A sterile dressing was applied followed by application of a Polar Care device.   The patient tolerated the procedure well. He was transported to the recovery room in stable condition.    ____________________________ Laurice Record. Holley Bouche., MD jph:sp D: 06/06/2014 06:16:28 ET T: 06/06/2014 10:47:36 ET JOB#: [id number]  cc: Laurice Record. Holley Bouche., MD, <Dictator> JAMES P Holley Bouche MD ELECTRONICALLY SIGNED 06/22/2014 10:01

## 2014-07-14 NOTE — Discharge Summary (Signed)
PATIENT NAME:  Christopher [name], Christopher [name] MR#:  [id number] DATE OF BIRTH:  11-Jun-1945  DATE OF ADMISSION:  06/05/2014 DATE OF DISCHARGE:  06/08/2014  ADMITTING DIAGNOSIS: Degenerative arthritis, right knee.   DISCHARGE DIAGNOSIS: Degenerative arthritis, right knee.   PROCEDURE PERFORMED: Right total knee arthroplasty using computer-assisted navigation.   SURGEON: Laurice Record. Holley Bouche., MD   ASSISTANT: Vance Peper, PA  ANESTHESIA: General.   ESTIMATED BLOOD LOSS: 50 mL  FLUIDS REPLACED: 1850 mL of crystalloid.  TOURNIQUET TIME: 136 minutes.   DRAINS: Two medium drains to reinfusion system.  SOFT TISSUE RELEASES: Anterior cruciate ligament, posterior cruciate ligament, deep and superficial medial collateral ligament, as well as patellofemoral ligament.  IMPLANTS UTILIZED: DePuy PFC Sigma size 6 posterior stabilized femoral component, size 6 MBT tibial component, 41 mm 3-peg oval dome patella, 10 mm stabilized rotating platform polyethylene insert.  INDICATIONS FOR PROCEDURE: The patient is a 69 year old male who has been seen for complaints of progressive right knee pain. X-rays demonstrated severe degenerative changes in tricompartmental fashion with relative varus deformity. After discussion of the risks and benefits of surgical intervention, the patient expressed understanding of the risks and benefits and agreed with plans for surgical intervention.   PHYSICAL EXAMINATION: LUNGS: Clear to auscultation bilaterally. No wheezing, rales, or rhonchi.  HEENT: Head normocephalic, atraumatic. Pupils equal, round, reactive to light.  HEART: Regular rate and rhythm. Normal S1, S2.  ABDOMEN: Soft, nontender, nondistended. Bowel sounds present.  EXTREMITIES: Good strength, stability, range of motion. Right knee has mild swelling with no effusion or warmth or erythema. He is tender along the medial joint line with relative varus alignment. There is mild medial pseudolaxity. There is good tracking of the  patella without evidence of subluxation or tilt. There is no quad atrophy noted. Range of motion is 0 to 100 degrees.  NEUROLOGIC: Awake, alert, and oriented. Sensory function is intact to pinprick and light touch. Motor strength is judged to be 5/5. Motor coordination is within normal limits. No apparent clonus or tremor.   HOSPITAL COURSE: The patient was admitted to the hospital on 06/05/2014. He had surgery that same day and was brought to the orthopedic floor from the PACU in stable condition. On postoperative day 1, the patient had acute postoperative blood loss anemia with a hemoglobin of 12.3. Creatinine was slightly up at 1.60. Vital signs were stable. On postoperative day 2, hemoglobin was down to 11.3 and creatinine trended up slightly to 1.63. The patient's vital signs were stable. He was doing well, tolerating p.o. He had good progress with physical therapy on postoperative day 1, but on postoperative day 2 he did struggle and it was recommended that he go to rehabilitation facility for continuation of PT and OT.   CONDITION AT DISCHARGE: Stable.   DISCHARGE INSTRUCTIONS: The patient may gradually increase weightbearing on the affected extremity. Elevate the affected foot or leg on 1 or 2 pillows with the foot higher than the knee. Thigh-high TED hose on both legs and remove at bedtime, replace when arising the next morning. Elevate the heels off the bed. Incentive spirometer every hour while awake. Encouraged cough and deep breathing. The patient may resume a regular diet as tolerated. Continue using Polar Care unit, maintaining a temperature between 40 and 50 degrees. Do not get the dressing or bandage wet or dirty. Call [facility] orthopedics if the dressing gets water under it. Leave the dressing on. Call [facility] orthopedics if any of the following occur: Bright red bleeding from  the incision or wound; fever above 101.5 degrees; redness, swelling, or drainage at the incision  site. Call [facility] orthopedics if the dressing gets water under it; if there is any increased leg pain, numbness or weakness in your legs; or bowel or bladder symptoms. The patient is for followup with [facility] orthopedics on 06/20/2014 at 8:45.   DISCHARGE MEDICATIONS: Please see discharge instructions for list of discharge medications.    ____________________________ T. Rachelle Hora, PA-C tcg:ST D: 06/08/2014 00:38:52 ET T: 06/08/2014 01:07:44 ET JOB#: [id number]  cc: T. Rachelle Hora, PA-C, <Dictator> Duanne Guess [place] ELECTRONICALLY SIGNED 06/17/2014 15:41

## 2014-07-15 DIAGNOSIS — Z96651 Presence of right artificial knee joint: Secondary | ICD-10-CM | POA: Diagnosis not present

## 2014-07-15 DIAGNOSIS — Z471 Aftercare following joint replacement surgery: Secondary | ICD-10-CM | POA: Diagnosis not present

## 2014-07-18 DIAGNOSIS — Z96651 Presence of right artificial knee joint: Secondary | ICD-10-CM | POA: Diagnosis not present

## 2014-07-18 DIAGNOSIS — Z471 Aftercare following joint replacement surgery: Secondary | ICD-10-CM | POA: Diagnosis not present

## 2014-07-23 DIAGNOSIS — Z96651 Presence of right artificial knee joint: Secondary | ICD-10-CM | POA: Diagnosis not present

## 2014-07-23 DIAGNOSIS — Z471 Aftercare following joint replacement surgery: Secondary | ICD-10-CM | POA: Diagnosis not present

## 2014-07-25 DIAGNOSIS — Z471 Aftercare following joint replacement surgery: Secondary | ICD-10-CM | POA: Diagnosis not present

## 2014-07-25 DIAGNOSIS — Z96651 Presence of right artificial knee joint: Secondary | ICD-10-CM | POA: Diagnosis not present

## 2014-09-13 ENCOUNTER — Other Ambulatory Visit: Payer: Self-pay | Admitting: Family Medicine

## 2014-09-13 DIAGNOSIS — M2550 Pain in unspecified joint: Secondary | ICD-10-CM

## 2014-10-04 DIAGNOSIS — I739 Peripheral vascular disease, unspecified: Secondary | ICD-10-CM | POA: Diagnosis not present

## 2014-10-04 DIAGNOSIS — N183 Chronic kidney disease, stage 3 (moderate): Secondary | ICD-10-CM | POA: Diagnosis not present

## 2014-10-04 DIAGNOSIS — E784 Other hyperlipidemia: Secondary | ICD-10-CM | POA: Diagnosis not present

## 2014-10-04 DIAGNOSIS — G459 Transient cerebral ischemic attack, unspecified: Secondary | ICD-10-CM | POA: Diagnosis not present

## 2014-10-04 DIAGNOSIS — I2510 Atherosclerotic heart disease of native coronary artery without angina pectoris: Secondary | ICD-10-CM | POA: Diagnosis not present

## 2014-10-07 DIAGNOSIS — N200 Calculus of kidney: Secondary | ICD-10-CM | POA: Diagnosis not present

## 2014-10-07 DIAGNOSIS — N183 Chronic kidney disease, stage 3 (moderate): Secondary | ICD-10-CM | POA: Diagnosis not present

## 2014-10-07 DIAGNOSIS — I129 Hypertensive chronic kidney disease with stage 1 through stage 4 chronic kidney disease, or unspecified chronic kidney disease: Secondary | ICD-10-CM | POA: Diagnosis not present

## 2014-10-07 DIAGNOSIS — M170 Bilateral primary osteoarthritis of knee: Secondary | ICD-10-CM | POA: Diagnosis not present

## 2014-10-10 ENCOUNTER — Other Ambulatory Visit: Payer: Self-pay | Admitting: Family Medicine

## 2014-10-10 DIAGNOSIS — I10 Essential (primary) hypertension: Secondary | ICD-10-CM

## 2014-10-16 LAB — COMPREHENSIVE METABOLIC PANEL
ALBUMIN: 3.7 g/dL
ALK PHOS: 61 U/L
AST: 19 U/L
Anion Gap: 9 (ref 7–16)
BUN: 30 mg/dL — AB
Bilirubin,Total: 0.2 mg/dL — ABNORMAL LOW
Calcium, Total: 8.6 mg/dL — ABNORMAL LOW
Chloride: 110 mmol/L
Co2: 22 mmol/L
Creatinine: 1.81 mg/dL — ABNORMAL HIGH
EGFR (African American): 44 — ABNORMAL LOW
EGFR (Non-African Amer.): 38 — ABNORMAL LOW
GLUCOSE: 134 mg/dL — AB
POTASSIUM: 4.2 mmol/L
SGPT (ALT): 17 U/L
Sodium: 141 mmol/L
TOTAL PROTEIN: 6.9 g/dL

## 2014-10-21 ENCOUNTER — Other Ambulatory Visit: Payer: Self-pay | Admitting: Family Medicine

## 2014-10-21 DIAGNOSIS — I10 Essential (primary) hypertension: Secondary | ICD-10-CM

## 2014-10-22 ENCOUNTER — Encounter: Payer: Self-pay | Admitting: Family Medicine

## 2014-10-22 ENCOUNTER — Ambulatory Visit (INDEPENDENT_AMBULATORY_CARE_PROVIDER_SITE_OTHER): Payer: Medicare Other | Admitting: Family Medicine

## 2014-10-22 ENCOUNTER — Other Ambulatory Visit: Payer: Self-pay

## 2014-10-22 VITALS — BP 110/64 | HR 64 | Ht 76.0 in | Wt 296.0 lb

## 2014-10-22 DIAGNOSIS — E784 Other hyperlipidemia: Secondary | ICD-10-CM | POA: Diagnosis not present

## 2014-10-22 DIAGNOSIS — Z23 Encounter for immunization: Secondary | ICD-10-CM | POA: Insufficient documentation

## 2014-10-22 DIAGNOSIS — I10 Essential (primary) hypertension: Secondary | ICD-10-CM

## 2014-10-22 DIAGNOSIS — M19071 Primary osteoarthritis, right ankle and foot: Secondary | ICD-10-CM | POA: Diagnosis not present

## 2014-10-22 DIAGNOSIS — K219 Gastro-esophageal reflux disease without esophagitis: Secondary | ICD-10-CM

## 2014-10-22 DIAGNOSIS — N23 Unspecified renal colic: Secondary | ICD-10-CM | POA: Insufficient documentation

## 2014-10-22 DIAGNOSIS — E669 Obesity, unspecified: Secondary | ICD-10-CM | POA: Insufficient documentation

## 2014-10-22 DIAGNOSIS — E7849 Other hyperlipidemia: Secondary | ICD-10-CM

## 2014-10-22 DIAGNOSIS — N289 Disorder of kidney and ureter, unspecified: Secondary | ICD-10-CM | POA: Insufficient documentation

## 2014-10-22 DIAGNOSIS — Z8639 Personal history of other endocrine, nutritional and metabolic disease: Secondary | ICD-10-CM | POA: Insufficient documentation

## 2014-10-22 DIAGNOSIS — Z0000 Encounter for general adult medical examination without abnormal findings: Secondary | ICD-10-CM | POA: Insufficient documentation

## 2014-10-22 DIAGNOSIS — I252 Old myocardial infarction: Secondary | ICD-10-CM | POA: Insufficient documentation

## 2014-10-22 DIAGNOSIS — Q245 Malformation of coronary vessels: Secondary | ICD-10-CM | POA: Insufficient documentation

## 2014-10-22 DIAGNOSIS — E8881 Metabolic syndrome: Secondary | ICD-10-CM | POA: Insufficient documentation

## 2014-10-22 DIAGNOSIS — R7989 Other specified abnormal findings of blood chemistry: Secondary | ICD-10-CM | POA: Insufficient documentation

## 2014-10-22 MED ORDER — FENOFIBRATE 145 MG PO TABS: 145.0000 mg | ORAL_TABLET | Freq: Every day | ORAL | Status: DC

## 2014-10-22 MED ORDER — CLOPIDOGREL BISULFATE 75 MG PO TABS: 75.0000 mg | ORAL_TABLET | Freq: Every day | ORAL | Status: DC

## 2014-10-22 MED ORDER — SIMVASTATIN 40 MG PO TABS: 40.0000 mg | ORAL_TABLET | Freq: Every day | ORAL | Status: DC

## 2014-10-22 MED ORDER — NIACIN ER 500 MG PO CPCR: 500.0000 mg | ORAL_CAPSULE | Freq: Every day | ORAL | Status: DC

## 2014-10-22 MED ORDER — AMLODIPINE BESYLATE 5 MG PO TABS: 5.0000 mg | ORAL_TABLET | Freq: Every day | ORAL | Status: DC

## 2014-10-22 MED ORDER — METOPROLOL SUCCINATE ER 100 MG PO TB24: 100.0000 mg | ORAL_TABLET | Freq: Every day | ORAL | Status: DC

## 2014-10-22 MED ORDER — RAMIPRIL 10 MG PO CAPS: 10.0000 mg | ORAL_CAPSULE | Freq: Every day | ORAL | Status: DC

## 2014-10-22 NOTE — Progress Notes (Signed)
Name: Christopher [name]   MRN: [id number]    DOB: 04-30-1945   Date:10/22/2014       Progress Note  Subjective  Chief Complaint  Chief Complaint  Patient presents with  . Hypertension  . Hyperlipidemia  . Foot Pain    when walking    Hypertension This is a chronic problem. The current episode started more than 1 year ago. The problem has been gradually improving since onset. The problem is controlled. Pertinent negatives include no anxiety, blurred vision, chest pain, headaches, malaise/fatigue, neck pain, orthopnea, palpitations, peripheral edema, PND, shortness of breath or sweats. There are no associated agents to hypertension. Risk factors for coronary artery disease include male gender and obesity. Past treatments include beta blockers, calcium channel blockers and ACE inhibitors. There are no compliance problems.  There is no history of angina, kidney disease, CAD/MI, CVA, heart failure, left ventricular hypertrophy, PVD, renovascular disease or retinopathy. Identifiable causes of hypertension include chronic renal disease.  Hyperlipidemia This is a chronic problem. The current episode started more than 1 year ago. The problem is controlled. Recent lipid tests were reviewed and are normal. Exacerbating diseases include chronic renal disease. He has no history of diabetes, liver disease or nephrotic syndrome. There are no known factors aggravating his hyperlipidemia. Pertinent negatives include no chest pain, focal sensory loss, focal weakness, leg pain, myalgias or shortness of breath. Current antihyperlipidemic treatment includes statins. The current treatment provides mild improvement of lipids. There are no compliance problems.  There are no known risk factors for coronary artery disease.  Foot Pain This is a new problem. The current episode started yesterday. The problem occurs daily. Pertinent negatives include no abdominal pain, chest pain, coughing, fatigue, headaches, myalgias,  nausea, neck pain or sore throat. The symptoms are aggravated by walking. He has tried nothing for the symptoms. The treatment provided mild relief.  Gastrophageal Reflux He reports no abdominal pain, no belching, no chest pain, no choking, no coughing, no dysphagia, no early satiety, no globus sensation, no heartburn, no hoarse voice, no nausea, no sore throat, no stridor, no tooth decay, no water brash or no wheezing. This is a chronic problem. The current episode started more than 1 year ago. The problem occurs rarely. The problem has been gradually improving. Nothing aggravates the symptoms. Pertinent negatives include no anemia, fatigue, melena, muscle weakness, orthopnea or weight loss. There are no known risk factors.    No problem-specific assessment & plan notes found for this encounter.   Past Medical History  Diagnosis Date  . Hypertension   . Hyperlipidemia   . Myocardial infarction     Past Surgical History  Procedure Laterality Date  . Knee arthroscopy Right 2016  . Colonoscopy  2011    Family History  Problem Relation Age of Onset  . Cancer Father   . Heart disease Father   . Diabetes Paternal Grandmother     History   Social History  . Marital Status: Widowed    Spouse Name: N/A  . Number of Children: N/A  . Years of Education: N/A   Occupational History  . Not on file.   Social History Main Topics  . Smoking status: Former Research scientist (life sciences)  . Smokeless tobacco: Not on file  . Alcohol Use: No  . Drug Use: No  . Sexual Activity: No   Other Topics Concern  . Not on file   Social History Narrative  . No narrative on file    Not on File  Review of Systems  Constitutional: Negative for weight loss, malaise/fatigue and fatigue.  HENT: Negative for hoarse voice and sore throat.   Eyes: Negative for blurred vision.  Respiratory: Negative for cough, choking, shortness of breath and wheezing.   Cardiovascular: Negative for chest pain, palpitations, orthopnea  and PND.  Gastrointestinal: Negative for heartburn, dysphagia, nausea, abdominal pain and melena.  Musculoskeletal: Negative for myalgias, muscle weakness and neck pain.  Neurological: Negative for focal weakness and headaches.     Objective  Filed Vitals:   10/22/14 0856  BP: 110/64  Pulse: 64  Height: 6\' 4"  (1.93 m)  Weight: 296 lb (134.265 kg)    Physical Exam    Assessment & Plan  Problem List Items Addressed This Visit      Cardiovascular and Mediastinum   Essential (primary) hypertension - Primary   Relevant Medications   amLODipine (NORVASC) 5 MG tablet   fenofibrate (TRICOR) 145 MG tablet   metoprolol succinate (TOPROL-XL) 100 MG 24 hr tablet   ramipril (ALTACE) 10 MG capsule   simvastatin (ZOCOR) 40 MG tablet   Other Relevant Orders   Renal Function Panel     Other   Familial multiple lipoprotein-type hyperlipidemia   Relevant Medications   amLODipine (NORVASC) 5 MG tablet   fenofibrate (TRICOR) 145 MG tablet   metoprolol succinate (TOPROL-XL) 100 MG 24 hr tablet   niacin 500 MG CR capsule   ramipril (ALTACE) 10 MG capsule   simvastatin (ZOCOR) 40 MG tablet   Other Relevant Orders   Lipid Profile    Other Visit Diagnoses    Gastroesophageal reflux disease, esophagitis presence not specified        Osteoarthritis of right foot, unspecified osteoarthritis type        Essential hypertension        Relevant Medications    amLODipine (NORVASC) 5 MG tablet    clopidogrel (PLAVIX) 75 MG tablet    fenofibrate (TRICOR) 145 MG tablet    metoprolol succinate (TOPROL-XL) 100 MG 24 hr tablet    ramipril (ALTACE) 10 MG capsule    simvastatin (ZOCOR) 40 MG tablet         Dr. Deanna Jones [facility]  10/22/2014

## 2014-10-23 LAB — RENAL FUNCTION PANEL
Albumin: 4.2 g/dL (ref 3.6–4.8)
BUN / CREAT RATIO: 16 (ref 10–22)
BUN: 29 mg/dL — ABNORMAL HIGH (ref 8–27)
CO2: 19 mmol/L (ref 18–29)
CREATININE: 1.87 mg/dL — AB (ref 0.76–1.27)
Calcium: 9.3 mg/dL (ref 8.6–10.2)
Chloride: 106 mmol/L (ref 97–108)
GFR, EST AFRICAN AMERICAN: 42 mL/min/{1.73_m2} — AB (ref >59)
GFR, EST NON AFRICAN AMERICAN: 36 mL/min/{1.73_m2} — AB (ref >59)
Glucose: 91 mg/dL (ref 65–99)
Phosphorus: 3.9 mg/dL (ref 2.5–4.5)
Potassium: 4.7 mmol/L (ref 3.5–5.2)
SODIUM: 145 mmol/L — AB (ref 134–144)

## 2014-10-23 LAB — LIPID PANEL
CHOLESTEROL TOTAL: 122 mg/dL (ref 100–199)
Chol/HDL Ratio: 5.3 ratio units — ABNORMAL HIGH (ref 0.0–5.0)
HDL: 23 mg/dL — ABNORMAL LOW (ref >39)
LDL Calculated: 65 mg/dL (ref 0–99)
TRIGLYCERIDES: 169 mg/dL — AB (ref 0–149)
VLDL CHOLESTEROL CAL: 34 mg/dL (ref 5–40)

## 2014-10-24 ENCOUNTER — Other Ambulatory Visit: Payer: Self-pay

## 2014-10-24 DIAGNOSIS — M25569 Pain in unspecified knee: Secondary | ICD-10-CM

## 2014-10-24 MED ORDER — DICLOFENAC SODIUM 1 % TD GEL: 4.0000 g | Freq: Four times a day (QID) | TRANSDERMAL | Status: DC

## 2014-11-01 ENCOUNTER — Other Ambulatory Visit: Payer: Self-pay | Admitting: Family Medicine

## 2014-11-01 DIAGNOSIS — M2550 Pain in unspecified joint: Secondary | ICD-10-CM

## 2014-11-14 ENCOUNTER — Other Ambulatory Visit: Payer: Self-pay | Admitting: Family Medicine

## 2014-11-14 DIAGNOSIS — I10 Essential (primary) hypertension: Secondary | ICD-10-CM

## 2014-12-10 ENCOUNTER — Other Ambulatory Visit: Payer: Self-pay | Admitting: Family Medicine

## 2014-12-11 DIAGNOSIS — Z23 Encounter for immunization: Secondary | ICD-10-CM | POA: Diagnosis not present

## 2014-12-12 DIAGNOSIS — Z96651 Presence of right artificial knee joint: Secondary | ICD-10-CM | POA: Diagnosis not present

## 2015-01-30 ENCOUNTER — Other Ambulatory Visit: Payer: Self-pay | Admitting: Family Medicine

## 2015-02-19 ENCOUNTER — Other Ambulatory Visit: Payer: Self-pay

## 2015-02-20 ENCOUNTER — Other Ambulatory Visit: Payer: Self-pay

## 2015-02-20 DIAGNOSIS — E785 Hyperlipidemia, unspecified: Secondary | ICD-10-CM

## 2015-02-20 MED ORDER — FENOFIBRATE 160 MG PO TABS: 160.0000 mg | ORAL_TABLET | Freq: Every day | ORAL | Status: DC

## 2015-02-21 ENCOUNTER — Other Ambulatory Visit: Payer: Self-pay | Admitting: Family Medicine

## 2015-04-30 DIAGNOSIS — N183 Chronic kidney disease, stage 3 (moderate): Secondary | ICD-10-CM | POA: Diagnosis not present

## 2015-05-05 ENCOUNTER — Other Ambulatory Visit: Payer: Self-pay | Admitting: Family Medicine

## 2015-05-05 DIAGNOSIS — N200 Calculus of kidney: Secondary | ICD-10-CM | POA: Diagnosis not present

## 2015-05-05 DIAGNOSIS — E6609 Other obesity due to excess calories: Secondary | ICD-10-CM | POA: Diagnosis not present

## 2015-05-05 DIAGNOSIS — I129 Hypertensive chronic kidney disease with stage 1 through stage 4 chronic kidney disease, or unspecified chronic kidney disease: Secondary | ICD-10-CM | POA: Diagnosis not present

## 2015-05-05 DIAGNOSIS — M170 Bilateral primary osteoarthritis of knee: Secondary | ICD-10-CM | POA: Diagnosis not present

## 2015-05-05 DIAGNOSIS — N183 Chronic kidney disease, stage 3 (moderate): Secondary | ICD-10-CM | POA: Diagnosis not present

## 2015-05-20 ENCOUNTER — Other Ambulatory Visit: Payer: Self-pay | Admitting: Family Medicine

## 2015-06-03 ENCOUNTER — Other Ambulatory Visit: Payer: Self-pay | Admitting: Family Medicine

## 2015-08-03 ENCOUNTER — Other Ambulatory Visit: Payer: Self-pay | Admitting: Family Medicine

## 2015-08-11 ENCOUNTER — Other Ambulatory Visit: Payer: Self-pay | Admitting: Family Medicine

## 2015-08-12 ENCOUNTER — Other Ambulatory Visit: Payer: Self-pay | Admitting: Family Medicine

## 2015-08-17 ENCOUNTER — Other Ambulatory Visit: Payer: Self-pay | Admitting: Family Medicine

## 2015-08-19 ENCOUNTER — Other Ambulatory Visit: Payer: Self-pay | Admitting: Family Medicine

## 2015-08-19 ENCOUNTER — Encounter: Payer: Self-pay | Admitting: Family Medicine

## 2015-08-19 ENCOUNTER — Ambulatory Visit (INDEPENDENT_AMBULATORY_CARE_PROVIDER_SITE_OTHER): Payer: Medicare Other | Admitting: Family Medicine

## 2015-08-19 VITALS — BP 124/80 | HR 72 | Ht 76.0 in | Wt 303.0 lb

## 2015-08-19 DIAGNOSIS — E784 Other hyperlipidemia: Secondary | ICD-10-CM | POA: Diagnosis not present

## 2015-08-19 DIAGNOSIS — Q245 Malformation of coronary vessels: Secondary | ICD-10-CM

## 2015-08-19 DIAGNOSIS — I10 Essential (primary) hypertension: Secondary | ICD-10-CM | POA: Diagnosis not present

## 2015-08-19 DIAGNOSIS — E7849 Other hyperlipidemia: Secondary | ICD-10-CM

## 2015-08-19 NOTE — Progress Notes (Signed)
Name: Christopher [name]   MRN: [id number]    DOB: [birth year]   Date:08/19/2015       Progress Note  Subjective  Chief Complaint  Chief Complaint  Patient presents with  . Hypertension  . Hyperlipidemia    Hypertension This is a chronic problem. The current episode started more than 1 year ago. The problem has been gradually improving since onset. The problem is controlled. Pertinent negatives include no anxiety, blurred vision, chest pain, headaches, malaise/fatigue, neck pain, orthopnea, palpitations, peripheral edema, PND, shortness of breath or sweats. There are no associated agents to hypertension. Past treatments include ACE inhibitors, calcium channel blockers and beta blockers. The current treatment provides moderate improvement. There are no compliance problems.  There is no history of angina, kidney disease, CAD/MI, CVA, heart failure, left ventricular hypertrophy, PVD, renovascular disease or retinopathy. There is no history of chronic renal disease or a hypertension causing med.  Hyperlipidemia This is a chronic problem. The current episode started more than 1 year ago. The problem is controlled. Recent lipid tests were reviewed and are normal. He has no history of chronic renal disease, diabetes, hypothyroidism, liver disease, obesity or nephrotic syndrome. There are no known factors aggravating his hyperlipidemia. Pertinent negatives include no chest pain, focal sensory loss, focal weakness, leg pain, myalgias or shortness of breath. Current antihyperlipidemic treatment includes statins. The current treatment provides mild improvement of lipids. There are no compliance problems.  Risk factors for coronary artery disease include dyslipidemia.    No problem-specific assessment & plan notes found for this encounter.   Past Medical History  Diagnosis Date  . Hypertension   . Hyperlipidemia   . Myocardial infarction [facility])     Past Surgical History  Procedure Laterality Date  .  Knee arthroscopy Right 2016  . Colonoscopy  2011    Family History  Problem Relation Age of Onset  . Cancer Father   . Heart disease Father   . Diabetes Paternal Grandmother     Social History   Social History  . Marital Status: Widowed    Spouse Name: N/A  . Number of Children: N/A  . Years of Education: N/A   Occupational History  . Not on file.   Social History Main Topics  . Smoking status: Former Research scientist (life sciences)  . Smokeless tobacco: Not on file  . Alcohol Use: No  . Drug Use: No  . Sexual Activity: No   Other Topics Concern  . Not on file   Social History Narrative    No Known Allergies   Review of Systems  Constitutional: Negative for fever, chills, weight loss and malaise/fatigue.  HENT: Negative for ear discharge, ear pain and sore throat.   Eyes: Negative for blurred vision.  Respiratory: Negative for cough, sputum production, shortness of breath and wheezing.   Cardiovascular: Negative for chest pain, palpitations, orthopnea, leg swelling and PND.  Gastrointestinal: Negative for heartburn, nausea, abdominal pain, diarrhea, constipation, blood in stool and melena.  Genitourinary: Negative for dysuria, urgency, frequency and hematuria.  Musculoskeletal: Negative for myalgias, back pain, joint pain and neck pain.  Skin: Negative for rash.  Neurological: Negative for dizziness, tingling, sensory change, focal weakness and headaches.  Endo/Heme/Allergies: Negative for environmental allergies and polydipsia. Does not bruise/bleed easily.  Psychiatric/Behavioral: Negative for depression and suicidal ideas. The patient is not nervous/anxious and does not have insomnia.      Objective  Filed Vitals:   08/19/15 1106  BP: 124/80  Pulse: 72  Height: 6'  4" (1.93 m)  Weight: 303 lb (137.44 kg)    Physical Exam  Constitutional: He is oriented to person, place, and time and well-developed, well-nourished, and in no distress.  HENT:  Head: Normocephalic.  Right  Ear: External ear normal.  Left Ear: External ear normal.  Nose: Nose normal.  Mouth/Throat: Oropharynx is clear and moist.  Eyes: Conjunctivae and EOM are normal. Pupils are equal, round, and reactive to light. Right eye exhibits no discharge. Left eye exhibits no discharge. No scleral icterus.  Neck: Normal range of motion. Neck supple. No JVD present. No tracheal deviation present. No thyromegaly present.  Cardiovascular: Normal rate, regular rhythm, normal heart sounds and intact distal pulses.  Exam reveals no gallop and no friction rub.   No murmur heard. Pulmonary/Chest: Breath sounds normal. No respiratory distress. He has no wheezes. He has no rales.  Abdominal: Soft. Bowel sounds are normal. He exhibits no mass. There is no hepatosplenomegaly. There is no tenderness. There is no rebound, no guarding and no CVA tenderness.  Musculoskeletal: Normal range of motion. He exhibits no edema or tenderness.  Lymphadenopathy:    He has no cervical adenopathy.  Neurological: He is alert and oriented to person, place, and time. He has normal sensation, normal strength, normal reflexes and intact cranial nerves. No cranial nerve deficit.  Skin: Skin is warm. No rash noted.  Psychiatric: Mood and affect normal.  Nursing note and vitals reviewed.     Assessment & Plan  Problem List Items Addressed This Visit      Cardiovascular and Mediastinum   Coronary artery abnormality   Essential (primary) hypertension - Primary   Relevant Orders   Renal Function Panel     Other   Familial multiple lipoprotein-type hyperlipidemia   Relevant Orders   Lipid Profile        Dr. Otilio Miu [facility]  08/19/2015

## 2015-08-20 ENCOUNTER — Other Ambulatory Visit: Payer: Self-pay | Admitting: Family Medicine

## 2015-08-20 LAB — RENAL FUNCTION PANEL
ALBUMIN: 4.3 g/dL (ref 3.6–4.8)
BUN / CREAT RATIO: 15 (ref 10–24)
BUN: 30 mg/dL — AB (ref 8–27)
CALCIUM: 9.6 mg/dL (ref 8.6–10.2)
CHLORIDE: 108 mmol/L — AB (ref 96–106)
CO2: 19 mmol/L (ref 18–29)
Creatinine, Ser: 2.05 mg/dL — ABNORMAL HIGH (ref 0.76–1.27)
GFR calc Af Amer: 37 mL/min/{1.73_m2} — ABNORMAL LOW (ref >59)
GFR calc non Af Amer: 32 mL/min/{1.73_m2} — ABNORMAL LOW (ref >59)
Glucose: 80 mg/dL (ref 65–99)
POTASSIUM: 5 mmol/L (ref 3.5–5.2)
Phosphorus: 3.8 mg/dL (ref 2.5–4.5)
Sodium: 147 mmol/L — ABNORMAL HIGH (ref 134–144)

## 2015-08-20 LAB — LIPID PANEL
CHOLESTEROL TOTAL: 121 mg/dL (ref 100–199)
Chol/HDL Ratio: 5.5 ratio units — ABNORMAL HIGH (ref 0.0–5.0)
HDL: 22 mg/dL — AB (ref >39)
LDL Calculated: 70 mg/dL (ref 0–99)
Triglycerides: 146 mg/dL (ref 0–149)
VLDL CHOLESTEROL CAL: 29 mg/dL (ref 5–40)

## 2015-08-22 ENCOUNTER — Other Ambulatory Visit: Payer: Self-pay | Admitting: Family Medicine

## 2015-08-26 ENCOUNTER — Other Ambulatory Visit: Payer: Self-pay | Admitting: Family Medicine

## 2015-09-18 ENCOUNTER — Other Ambulatory Visit: Payer: Self-pay | Admitting: Family Medicine

## 2015-10-02 DIAGNOSIS — I2510 Atherosclerotic heart disease of native coronary artery without angina pectoris: Secondary | ICD-10-CM | POA: Diagnosis not present

## 2015-10-02 DIAGNOSIS — I739 Peripheral vascular disease, unspecified: Secondary | ICD-10-CM | POA: Diagnosis not present

## 2015-10-02 DIAGNOSIS — E669 Obesity, unspecified: Secondary | ICD-10-CM | POA: Diagnosis not present

## 2015-10-02 DIAGNOSIS — M1990 Unspecified osteoarthritis, unspecified site: Secondary | ICD-10-CM | POA: Diagnosis not present

## 2015-10-02 DIAGNOSIS — G459 Transient cerebral ischemic attack, unspecified: Secondary | ICD-10-CM | POA: Diagnosis not present

## 2015-10-02 DIAGNOSIS — I10 Essential (primary) hypertension: Secondary | ICD-10-CM | POA: Diagnosis not present

## 2015-10-02 DIAGNOSIS — R0602 Shortness of breath: Secondary | ICD-10-CM | POA: Diagnosis not present

## 2015-10-02 DIAGNOSIS — E784 Other hyperlipidemia: Secondary | ICD-10-CM | POA: Diagnosis not present

## 2015-10-02 DIAGNOSIS — G4733 Obstructive sleep apnea (adult) (pediatric): Secondary | ICD-10-CM | POA: Diagnosis not present

## 2015-10-22 ENCOUNTER — Other Ambulatory Visit: Payer: Self-pay | Admitting: Family Medicine

## 2015-10-22 DIAGNOSIS — I10 Essential (primary) hypertension: Secondary | ICD-10-CM

## 2015-11-21 DIAGNOSIS — Z23 Encounter for immunization: Secondary | ICD-10-CM | POA: Diagnosis not present

## 2015-11-24 ENCOUNTER — Other Ambulatory Visit: Payer: Self-pay | Admitting: Family Medicine

## 2015-11-24 DIAGNOSIS — I10 Essential (primary) hypertension: Secondary | ICD-10-CM

## 2015-11-27 DIAGNOSIS — N183 Chronic kidney disease, stage 3 (moderate): Secondary | ICD-10-CM | POA: Diagnosis not present

## 2015-12-01 DIAGNOSIS — E559 Vitamin D deficiency, unspecified: Secondary | ICD-10-CM | POA: Diagnosis not present

## 2015-12-25 ENCOUNTER — Other Ambulatory Visit: Payer: Self-pay | Admitting: Family Medicine

## 2015-12-25 DIAGNOSIS — I10 Essential (primary) hypertension: Secondary | ICD-10-CM

## 2016-01-14 ENCOUNTER — Other Ambulatory Visit: Payer: Self-pay | Admitting: Family Medicine

## 2016-01-14 DIAGNOSIS — I10 Essential (primary) hypertension: Secondary | ICD-10-CM

## 2016-01-15 ENCOUNTER — Other Ambulatory Visit: Payer: Self-pay

## 2016-01-15 DIAGNOSIS — I10 Essential (primary) hypertension: Secondary | ICD-10-CM

## 2016-01-15 MED ORDER — METOPROLOL SUCCINATE ER 100 MG PO TB24: 100.0000 mg | ORAL_TABLET | Freq: Every day | ORAL | 0 refills | Status: DC

## 2016-01-23 ENCOUNTER — Other Ambulatory Visit: Payer: Self-pay | Admitting: Family Medicine

## 2016-02-18 ENCOUNTER — Ambulatory Visit (INDEPENDENT_AMBULATORY_CARE_PROVIDER_SITE_OTHER): Payer: Medicare Other | Admitting: Family Medicine

## 2016-02-18 ENCOUNTER — Encounter: Payer: Self-pay | Admitting: Family Medicine

## 2016-02-18 ENCOUNTER — Other Ambulatory Visit: Payer: Self-pay | Admitting: *Deleted

## 2016-02-18 VITALS — BP 126/88 | HR 72 | Temp 97.4°F | Ht 76.0 in | Wt 325.0 lb

## 2016-02-18 DIAGNOSIS — N401 Enlarged prostate with lower urinary tract symptoms: Secondary | ICD-10-CM

## 2016-02-18 DIAGNOSIS — Z23 Encounter for immunization: Secondary | ICD-10-CM | POA: Diagnosis not present

## 2016-02-18 DIAGNOSIS — I10 Essential (primary) hypertension: Secondary | ICD-10-CM | POA: Diagnosis not present

## 2016-02-18 DIAGNOSIS — Z1159 Encounter for screening for other viral diseases: Secondary | ICD-10-CM

## 2016-02-18 DIAGNOSIS — E7849 Other hyperlipidemia: Secondary | ICD-10-CM

## 2016-02-18 DIAGNOSIS — R3911 Hesitancy of micturition: Secondary | ICD-10-CM

## 2016-02-18 DIAGNOSIS — G4730 Sleep apnea, unspecified: Secondary | ICD-10-CM

## 2016-02-18 DIAGNOSIS — E784 Other hyperlipidemia: Secondary | ICD-10-CM

## 2016-02-18 MED ORDER — RAMIPRIL 10 MG PO CAPS: 10.0000 mg | ORAL_CAPSULE | Freq: Every day | ORAL | 1 refills | Status: DC

## 2016-02-18 MED ORDER — CLOPIDOGREL BISULFATE 75 MG PO TABS: ORAL_TABLET | ORAL | 1 refills | Status: DC

## 2016-02-18 MED ORDER — FENOFIBRATE 160 MG PO TABS: 160.0000 mg | ORAL_TABLET | Freq: Every day | ORAL | 1 refills | Status: DC

## 2016-02-18 MED ORDER — METOPROLOL SUCCINATE ER 100 MG PO TB24: 100.0000 mg | ORAL_TABLET | Freq: Every day | ORAL | 0 refills | Status: DC

## 2016-02-18 MED ORDER — SIMVASTATIN 40 MG PO TABS: ORAL_TABLET | ORAL | 1 refills | Status: DC

## 2016-02-18 MED ORDER — NIACIN ER 500 MG PO CPCR: 500.0000 mg | ORAL_CAPSULE | Freq: Every day | ORAL | 1 refills | Status: DC

## 2016-02-18 MED ORDER — AMLODIPINE BESYLATE 5 MG PO TABS: 5.0000 mg | ORAL_TABLET | Freq: Every day | ORAL | 1 refills | Status: DC

## 2016-02-18 NOTE — Progress Notes (Signed)
Name: Christopher [name]   MRN: [id number]    DOB: 12-09-1945   Date:02/18/2016       Progress Note  Subjective  Chief Complaint  Chief Complaint  Patient presents with  . Medication Refill    Pt stated need all medication refill....    Medication Refill  This is a chronic problem. The current episode started more than 1 year ago. The problem has been unchanged. Pertinent negatives include no abdominal pain, anorexia, arthralgias, change in bowel habit, chest pain, chills, congestion, coughing, diaphoresis, fatigue, fever, headaches, joint swelling, myalgias, nausea, neck pain, numbness, rash, sore throat, swollen glands, urinary symptoms, vertigo, visual change, vomiting or weakness. Nothing aggravates the symptoms. He has tried nothing for the symptoms. The treatment provided no relief.    No problem-specific Assessment & Plan notes found for this encounter.   Past Medical History:  Diagnosis Date  . Hyperlipidemia   . Hypertension   . Myocardial infarction     Past Surgical History:  Procedure Laterality Date  . COLONOSCOPY  2011  . KNEE ARTHROSCOPY Right 2016    Family History  Problem Relation Age of Onset  . Cancer Father   . Heart disease Father   . Diabetes Paternal Grandmother     Social History   Social History  . Marital status: Widowed    Spouse name: N/A  . Number of children: N/A  . Years of education: N/A   Occupational History  . Not on file.   Social History Main Topics  . Smoking status: Former Research scientist (life sciences)  . Smokeless tobacco: Not on file  . Alcohol use No  . Drug use: No  . Sexual activity: No   Other Topics Concern  . Not on file   Social History Narrative  . No narrative on file    No Known Allergies   Review of Systems  Constitutional: Negative for chills, diaphoresis, fatigue, fever, malaise/fatigue and weight loss.  HENT: Negative for congestion, ear discharge, ear pain and sore throat.   Eyes: Negative for blurred vision.   Respiratory: Negative for cough, sputum production, shortness of breath and wheezing.   Cardiovascular: Negative for chest pain, palpitations and leg swelling.  Gastrointestinal: Negative for abdominal pain, anorexia, blood in stool, change in bowel habit, constipation, diarrhea, heartburn, melena, nausea and vomiting.  Genitourinary: Negative for dysuria, frequency, hematuria and urgency.  Musculoskeletal: Negative for arthralgias, back pain, joint pain, joint swelling, myalgias and neck pain.  Skin: Negative for rash.  Neurological: Negative for dizziness, vertigo, tingling, sensory change, focal weakness, weakness, numbness and headaches.  Endo/Heme/Allergies: Negative for environmental allergies and polydipsia. Does not bruise/bleed easily.  Psychiatric/Behavioral: Negative for depression and suicidal ideas. The patient is not nervous/anxious and does not have insomnia.      Objective  Vitals:   02/18/16 0906  BP: 126/88  Pulse: 72  Temp: 97.4 F (36.3 C)  Weight: (!) 325 lb (147.4 kg)  Height: 6\' 4"  (1.93 m)    Physical Exam  Constitutional: He is oriented to person, place, and time and well-developed, well-nourished, and in no distress.  HENT:  Head: Normocephalic.  Right Ear: External ear normal.  Left Ear: External ear normal.  Nose: Nose normal.  Mouth/Throat: Oropharynx is clear and moist.  Eyes: Conjunctivae and EOM are normal. Pupils are equal, round, and reactive to light. Right eye exhibits no discharge. Left eye exhibits no discharge. No scleral icterus.  Neck: Normal range of motion. Neck supple. No JVD present. No tracheal  deviation present. No thyromegaly present.  Cardiovascular: Normal rate, regular rhythm, normal heart sounds and intact distal pulses.  Exam reveals no gallop and no friction rub.   No murmur heard. Pulmonary/Chest: Breath sounds normal. No respiratory distress. He has no wheezes. He has no rales. He exhibits no tenderness.  Abdominal: Soft.  Bowel sounds are normal. He exhibits no mass. There is no hepatosplenomegaly. There is no tenderness. There is no rebound, no guarding and no CVA tenderness.  Musculoskeletal: Normal range of motion. He exhibits no edema or tenderness.  Lymphadenopathy:    He has no cervical adenopathy.  Neurological: He is alert and oriented to person, place, and time. He has normal sensation, normal strength, normal reflexes and intact cranial nerves. No cranial nerve deficit.  Skin: Skin is warm. No rash noted.  Psychiatric: Mood and affect normal.  Nursing note and vitals reviewed.     Assessment & Plan  Problem List Items Addressed This Visit      Cardiovascular and Mediastinum   Essential (primary) hypertension - Primary   Relevant Medications   metoprolol succinate (TOPROL-XL) 100 MG 24 hr tablet   amLODipine (NORVASC) 5 MG tablet   ramipril (ALTACE) 10 MG capsule   fenofibrate 160 MG tablet   simvastatin (ZOCOR) 40 MG tablet   Other Relevant Orders   Renal Function Panel     Other   Familial multiple lipoprotein-type hyperlipidemia   Relevant Medications   metoprolol succinate (TOPROL-XL) 100 MG 24 hr tablet   amLODipine (NORVASC) 5 MG tablet   ramipril (ALTACE) 10 MG capsule   niacin 500 MG CR capsule   fenofibrate 160 MG tablet   simvastatin (ZOCOR) 40 MG tablet   Other Relevant Orders   Lipid Profile    Other Visit Diagnoses    Essential hypertension       Relevant Medications   metoprolol succinate (TOPROL-XL) 100 MG 24 hr tablet   amLODipine (NORVASC) 5 MG tablet   ramipril (ALTACE) 10 MG capsule   fenofibrate 160 MG tablet   simvastatin (ZOCOR) 40 MG tablet   Benign prostatic hyperplasia with urinary hesitancy       Relevant Orders   PSA   Sleep apnea, unspecified type       Immunization due       Need for hepatitis C screening test       Relevant Orders   Hepatitis C antibody        Dr. Telitha Plath [facility]  02/18/16

## 2016-02-18 NOTE — Addendum Note (Signed)
Addended by: Elta Guadeloupe on: 02/18/2016 01:44 PM   Modules accepted: Orders

## 2016-02-19 LAB — RENAL FUNCTION PANEL
ALBUMIN: 4.3 g/dL (ref 3.6–4.8)
BUN/Creatinine Ratio: 12 (ref 10–24)
BUN: 25 mg/dL (ref 8–27)
CHLORIDE: 106 mmol/L (ref 96–106)
CO2: 21 mmol/L (ref 18–29)
Calcium: 9.6 mg/dL (ref 8.6–10.2)
Creatinine, Ser: 2.06 mg/dL — ABNORMAL HIGH (ref 0.76–1.27)
GFR, EST AFRICAN AMERICAN: 37 mL/min/{1.73_m2} — AB (ref >59)
GFR, EST NON AFRICAN AMERICAN: 32 mL/min/{1.73_m2} — AB (ref >59)
GLUCOSE: 88 mg/dL (ref 65–99)
PHOSPHORUS: 3.5 mg/dL (ref 2.5–4.5)
POTASSIUM: 4.8 mmol/L (ref 3.5–5.2)
Sodium: 145 mmol/L — ABNORMAL HIGH (ref 134–144)

## 2016-02-19 LAB — PSA: Prostate Specific Ag, Serum: 1.1 ng/mL (ref 0.0–4.0)

## 2016-02-19 LAB — HEPATITIS C ANTIBODY: Hep C Virus Ab: <0.1 s/co ratio (ref 0.0–0.9)

## 2016-03-22 ENCOUNTER — Ambulatory Visit (INDEPENDENT_AMBULATORY_CARE_PROVIDER_SITE_OTHER): Payer: Medicare Other | Admitting: Family Medicine

## 2016-03-22 ENCOUNTER — Encounter: Payer: Self-pay | Admitting: Family Medicine

## 2016-03-22 VITALS — BP 120/70 | HR 80 | Temp 97.7°F | Ht 76.0 in | Wt 318.0 lb

## 2016-03-22 DIAGNOSIS — J40 Bronchitis, not specified as acute or chronic: Secondary | ICD-10-CM | POA: Diagnosis not present

## 2016-03-22 MED ORDER — GUAIFENESIN-CODEINE 100-10 MG/5ML PO SYRP: 5.0000 mL | ORAL_SOLUTION | Freq: Three times a day (TID) | ORAL | 0 refills | Status: DC | PRN

## 2016-03-22 MED ORDER — AMOXICILLIN 500 MG PO CAPS: 500.0000 mg | ORAL_CAPSULE | Freq: Three times a day (TID) | ORAL | 0 refills | Status: DC

## 2016-03-22 NOTE — Progress Notes (Signed)
Name: Christopher [name]   MRN: [id number]    DOB: [birth year]   Date:03/22/2016       Progress Note  Subjective  Chief Complaint  Chief Complaint  Patient presents with  . Sinusitis    cough and cong- taking Mucinex and Alkaseltzer Plus- "got a little better, but still hanging on"    Sinusitis  This is a chronic problem. The current episode started 1 to 4 weeks ago. The problem has been waxing and waning since onset. There has been no fever. Associated symptoms include congestion, coughing and sinus pressure. Pertinent negatives include no chills, diaphoresis, ear pain, headaches, hoarse voice, neck pain, shortness of breath, sneezing, sore throat or swollen glands. Past treatments include nothing. The treatment provided mild relief.    No problem-specific Assessment & Plan notes found for this encounter.   Past Medical History:  Diagnosis Date  . Hyperlipidemia   . Hypertension   . Myocardial infarction     Past Surgical History:  Procedure Laterality Date  . COLONOSCOPY  2011  . KNEE ARTHROSCOPY Right 2016    Family History  Problem Relation Age of Onset  . Cancer Father   . Heart disease Father   . Diabetes Paternal Grandmother     Social History   Social History  . Marital status: Widowed    Spouse name: N/A  . Number of children: N/A  . Years of education: N/A   Occupational History  . Not on file.   Social History Main Topics  . Smoking status: Former Research scientist (life sciences)  . Smokeless tobacco: Not on file  . Alcohol use No  . Drug use: No  . Sexual activity: No   Other Topics Concern  . Not on file   Social History Narrative  . No narrative on file    No Known Allergies   Review of Systems  Constitutional: Negative for chills, diaphoresis, fever, malaise/fatigue and weight loss.  HENT: Positive for congestion and sinus pressure. Negative for ear discharge, ear pain, hoarse voice, sneezing and sore throat.   Eyes: Negative for blurred vision.  Respiratory:  Positive for cough. Negative for sputum production, shortness of breath and wheezing.   Cardiovascular: Negative for chest pain, palpitations and leg swelling.  Gastrointestinal: Negative for abdominal pain, blood in stool, constipation, diarrhea, heartburn, melena and nausea.  Genitourinary: Negative for dysuria, frequency, hematuria and urgency.  Musculoskeletal: Negative for back pain, joint pain, myalgias and neck pain.  Skin: Negative for rash.  Neurological: Negative for dizziness, tingling, sensory change, focal weakness and headaches.  Endo/Heme/Allergies: Negative for environmental allergies and polydipsia. Does not bruise/bleed easily.  Psychiatric/Behavioral: Negative for depression and suicidal ideas. The patient is not nervous/anxious and does not have insomnia.      Objective  Vitals:   03/22/16 0923  BP: 120/70  Pulse: 80  Temp: 97.7 F (36.5 C)  TempSrc: Oral  SpO2: 98%  Weight: (!) 318 lb (144.2 kg)  Height: 6\' 4"  (1.93 m)    Physical Exam  Constitutional: He is oriented to person, place, and time and well-developed, well-nourished, and in no distress.  HENT:  Head: Normocephalic.  Right Ear: External ear normal.  Left Ear: External ear normal.  Nose: Nose normal.  Mouth/Throat: Oropharynx is clear and moist.  Eyes: Conjunctivae and EOM are normal. Pupils are equal, round, and reactive to light. Right eye exhibits no discharge. Left eye exhibits no discharge. No scleral icterus.  Neck: Normal range of motion. Neck supple. No JVD present.  No tracheal deviation present. No thyromegaly present.  Cardiovascular: Normal rate, regular rhythm, normal heart sounds and intact distal pulses.  Exam reveals no gallop and no friction rub.   No murmur heard. Pulmonary/Chest: Breath sounds normal. No respiratory distress. He has no wheezes. He has no rales.  Abdominal: Soft. Bowel sounds are normal. He exhibits no mass. There is no hepatosplenomegaly. There is no tenderness.  There is no rebound, no guarding and no CVA tenderness.  Musculoskeletal: Normal range of motion. He exhibits no edema or tenderness.  Lymphadenopathy:    He has no cervical adenopathy.  Neurological: He is alert and oriented to person, place, and time. He has normal sensation, normal strength, normal reflexes and intact cranial nerves. No cranial nerve deficit.  Skin: Skin is warm. No rash noted.  Psychiatric: Mood and affect normal.  Nursing note and vitals reviewed.     Assessment & Plan  Problem List Items Addressed This Visit    None    Visit Diagnoses    Bronchitis    -  Primary   Relevant Medications   amoxicillin (AMOXIL) 500 MG capsule   guaiFENesin-codeine (ROBITUSSIN AC) 100-10 MG/5ML syrup        Dr. [facility]  03/22/16

## 2016-05-07 ENCOUNTER — Other Ambulatory Visit: Payer: Self-pay | Admitting: Family Medicine

## 2016-05-23 ENCOUNTER — Other Ambulatory Visit: Payer: Self-pay | Admitting: Family Medicine

## 2016-06-02 ENCOUNTER — Other Ambulatory Visit: Payer: Self-pay

## 2016-06-03 DIAGNOSIS — N183 Chronic kidney disease, stage 3 (moderate): Secondary | ICD-10-CM | POA: Diagnosis not present

## 2016-06-07 DIAGNOSIS — I129 Hypertensive chronic kidney disease with stage 1 through stage 4 chronic kidney disease, or unspecified chronic kidney disease: Secondary | ICD-10-CM | POA: Diagnosis not present

## 2016-06-07 DIAGNOSIS — E6609 Other obesity due to excess calories: Secondary | ICD-10-CM | POA: Diagnosis not present

## 2016-06-07 DIAGNOSIS — N183 Chronic kidney disease, stage 3 (moderate): Secondary | ICD-10-CM | POA: Diagnosis not present

## 2016-06-07 DIAGNOSIS — Z6832 Body mass index (BMI) 32.0-32.9, adult: Secondary | ICD-10-CM | POA: Diagnosis not present

## 2016-06-07 DIAGNOSIS — N200 Calculus of kidney: Secondary | ICD-10-CM | POA: Diagnosis not present

## 2016-07-15 ENCOUNTER — Other Ambulatory Visit: Payer: Self-pay | Admitting: Family Medicine

## 2016-07-15 DIAGNOSIS — I10 Essential (primary) hypertension: Secondary | ICD-10-CM

## 2016-08-21 ENCOUNTER — Other Ambulatory Visit: Payer: Self-pay | Admitting: Family Medicine

## 2016-09-22 ENCOUNTER — Other Ambulatory Visit: Payer: Self-pay | Admitting: Family Medicine

## 2016-10-14 ENCOUNTER — Other Ambulatory Visit: Payer: Self-pay | Admitting: Family Medicine

## 2016-10-14 DIAGNOSIS — I10 Essential (primary) hypertension: Secondary | ICD-10-CM

## 2016-11-18 DIAGNOSIS — Z23 Encounter for immunization: Secondary | ICD-10-CM | POA: Diagnosis not present

## 2016-11-21 ENCOUNTER — Other Ambulatory Visit: Payer: Self-pay | Admitting: Family Medicine

## 2016-11-22 ENCOUNTER — Other Ambulatory Visit: Payer: Self-pay | Admitting: Family Medicine

## 2016-11-25 ENCOUNTER — Other Ambulatory Visit: Payer: Self-pay | Admitting: Family Medicine

## 2016-11-25 DIAGNOSIS — I10 Essential (primary) hypertension: Secondary | ICD-10-CM

## 2016-11-28 ENCOUNTER — Other Ambulatory Visit: Payer: Self-pay | Admitting: Family Medicine

## 2016-11-29 ENCOUNTER — Other Ambulatory Visit: Payer: Self-pay | Admitting: Family Medicine

## 2016-12-10 ENCOUNTER — Other Ambulatory Visit: Payer: Self-pay | Admitting: Family Medicine

## 2016-12-10 DIAGNOSIS — I10 Essential (primary) hypertension: Secondary | ICD-10-CM

## 2016-12-16 ENCOUNTER — Encounter: Payer: Self-pay | Admitting: Family Medicine

## 2016-12-16 ENCOUNTER — Ambulatory Visit (INDEPENDENT_AMBULATORY_CARE_PROVIDER_SITE_OTHER): Payer: Medicare Other | Admitting: Family Medicine

## 2016-12-16 VITALS — BP 130/88 | HR 60 | Ht 76.0 in | Wt 342.0 lb

## 2016-12-16 DIAGNOSIS — Z23 Encounter for immunization: Secondary | ICD-10-CM

## 2016-12-16 DIAGNOSIS — E7849 Other hyperlipidemia: Secondary | ICD-10-CM

## 2016-12-16 DIAGNOSIS — R69 Illness, unspecified: Secondary | ICD-10-CM | POA: Diagnosis not present

## 2016-12-16 DIAGNOSIS — I679 Cerebrovascular disease, unspecified: Secondary | ICD-10-CM

## 2016-12-16 DIAGNOSIS — I10 Essential (primary) hypertension: Secondary | ICD-10-CM

## 2016-12-16 DIAGNOSIS — Z1211 Encounter for screening for malignant neoplasm of colon: Secondary | ICD-10-CM | POA: Diagnosis not present

## 2016-12-16 DIAGNOSIS — G459 Transient cerebral ischemic attack, unspecified: Secondary | ICD-10-CM | POA: Diagnosis not present

## 2016-12-16 LAB — HEMOCCULT GUIAC POC 1CARD (OFFICE): FECAL OCCULT BLD: NEGATIVE

## 2016-12-16 MED ORDER — METOPROLOL SUCCINATE ER 100 MG PO TB24: ORAL_TABLET | ORAL | 1 refills | Status: DC

## 2016-12-16 MED ORDER — CLOPIDOGREL BISULFATE 75 MG PO TABS: ORAL_TABLET | ORAL | 1 refills | Status: DC

## 2016-12-16 MED ORDER — FENOFIBRATE 160 MG PO TABS: 160.0000 mg | ORAL_TABLET | Freq: Every day | ORAL | 1 refills | Status: DC

## 2016-12-16 MED ORDER — RAMIPRIL 10 MG PO CAPS: 10.0000 mg | ORAL_CAPSULE | Freq: Every day | ORAL | 1 refills | Status: DC

## 2016-12-16 MED ORDER — AMLODIPINE BESYLATE 5 MG PO TABS: 5.0000 mg | ORAL_TABLET | Freq: Every day | ORAL | 1 refills | Status: DC

## 2016-12-16 MED ORDER — SIMVASTATIN 40 MG PO TABS: ORAL_TABLET | ORAL | 1 refills | Status: DC

## 2016-12-16 MED ORDER — NIACIN ER 500 MG PO CPCR: 500.0000 mg | ORAL_CAPSULE | Freq: Every day | ORAL | 1 refills | Status: DC

## 2016-12-16 NOTE — Progress Notes (Signed)
Name: Christopher [name]   MRN: [id number]    DOB: 1945/10/20   Date:12/16/2016       Progress Note  Subjective  Chief Complaint  Chief Complaint  Patient presents with  . Hypertension  . Hyperlipidemia  . Transient Ischemic Attack    takes plavix    Hypertension  This is a new problem. The current episode started more than 1 year ago. The problem is unchanged. The problem is controlled. Pertinent negatives include no anxiety, blurred vision, chest pain, headaches, malaise/fatigue, neck pain, orthopnea, palpitations, peripheral edema, PND, shortness of breath or sweats. There are no associated agents to hypertension. There are no known risk factors for coronary artery disease. Past treatments include ACE inhibitors, beta blockers and calcium channel blockers. The current treatment provides moderate improvement. There are no compliance problems.  There is no history of angina, kidney disease, CAD/MI, CVA, heart failure, left ventricular hypertrophy, PVD or retinopathy. There is no history of chronic renal disease, a hypertension causing med or renovascular disease.  Neurologic Problem  The patient's pertinent negatives include no altered mental status, clumsiness, focal sensory loss, focal weakness, loss of balance, memory loss, near-syncope, slurred speech, syncope, visual change or weakness. Primary symptoms comment: cerebral vasc dis. This is a new problem. The current episode started more than 1 year ago. The problem is unchanged. Pertinent negatives include no abdominal pain, back pain, bladder incontinence, bowel incontinence, chest pain, dizziness, fever, headaches, nausea, neck pain, palpitations or shortness of breath. Past treatments include nothing. The treatment provided mild relief. There is no history of a bleeding disorder, a clotting disorder, a CVA, dementia, head trauma, liver disease, mood changes or seizures.  Hyperlipidemia  This is a chronic problem. The current episode  started more than 1 year ago. The problem is controlled. Recent lipid tests were reviewed and are normal. He has no history of chronic renal disease, diabetes, hypothyroidism, liver disease, obesity or nephrotic syndrome. Pertinent negatives include no chest pain, focal sensory loss, focal weakness, leg pain, myalgias or shortness of breath. Current antihyperlipidemic treatment includes statins. The current treatment provides moderate improvement of lipids.    No problem-specific Assessment & Plan notes found for this encounter.   Past Medical History:  Diagnosis Date  . Hyperlipidemia   . Hypertension   . Myocardial infarction [facility])     Past Surgical History:  Procedure Laterality Date  . COLONOSCOPY  2011  . KNEE ARTHROSCOPY Right 2016    Family History  Problem Relation Age of Onset  . Cancer Father   . Heart disease Father   . Diabetes Paternal Grandmother     Social History   Social History  . Marital status: Widowed    Spouse name: N/A  . Number of children: N/A  . Years of education: N/A   Occupational History  . Not on file.   Social History Main Topics  . Smoking status: Former Research scientist (life sciences)  . Smokeless tobacco: Never Used  . Alcohol use No  . Drug use: No  . Sexual activity: No   Other Topics Concern  . Not on file   Social History Narrative  . No narrative on file    No Known Allergies  Outpatient Medications Prior to Visit  Medication Sig Dispense Refill  . Cholecalciferol (VITAMIN D) 2000 units tablet Take by mouth.    . diclofenac sodium (VOLTAREN) 1 % GEL Apply 4 g topically 4 (four) times daily. (Patient taking differently: Apply 4 g topically 4 (four) times  daily. Dr Marry Guan) 1 Tube 1  . etodolac (LODINE) 500 MG tablet TAKE 1 TABLET BY MOUTH TWICE DAILY 60 tablet 0  . amLODipine (NORVASC) 5 MG tablet TAKE 1 TABLET BY MOUTH EVERY DAY 90 tablet 0  . clopidogrel (PLAVIX) 75 MG tablet TAKE 1 TABLET BY MOUTH EVERY DAY AT 6:00AM 30 tablet 0  .  fenofibrate 160 MG tablet TAKE 1 TABLET BY MOUTH EVERY DAY 30 tablet 0  . metoprolol succinate (TOPROL-XL) 100 MG 24 hr tablet TAKE 1 TABLET BY MOUTH EVERY DAY IMMEDIATELY FOLLOWING A MEAL 30 tablet 0  . niacin 500 MG CR capsule Take 1 capsule (500 mg total) by mouth daily at 6 (six) AM. 90 capsule 1  . ramipril (ALTACE) 10 MG capsule TAKE ONE CAPSULE BY MOUTH DAILY 90 capsule 0  . simvastatin (ZOCOR) 40 MG tablet TAKE 1 TABLET BY MOUTH EVERY DAY AT 6AM 90 tablet 1  . amoxicillin (AMOXIL) 500 MG capsule Take 1 capsule (500 mg total) by mouth 3 (three) times daily. 30 capsule 0  . FLUZONE HIGH-DOSE 0.5 ML SUSY ADM 0.5ML IM UTD  0  . guaiFENesin-codeine (ROBITUSSIN AC) 100-10 MG/5ML syrup Take 5 mLs by mouth 3 (three) times daily as needed for cough. 150 mL 0  . ramipril (ALTACE) 10 MG capsule Take 1 capsule (10 mg total) by mouth daily. 90 capsule 1  . simvastatin (ZOCOR) 40 MG tablet TAKE 1 TABLET BY MOUTH EVERY DAY AT 6 AM 90 tablet 0   No facility-administered medications prior to visit.     Review of Systems  Constitutional: Negative for chills, fever, malaise/fatigue and weight loss.  HENT: Negative for ear discharge, ear pain and sore throat.   Eyes: Negative for blurred vision.  Respiratory: Negative for cough, sputum production, shortness of breath and wheezing.   Cardiovascular: Negative for chest pain, palpitations, orthopnea, leg swelling, PND and near-syncope.  Gastrointestinal: Negative for abdominal pain, blood in stool, bowel incontinence, constipation, diarrhea, heartburn, melena and nausea.  Genitourinary: Negative for bladder incontinence, dysuria, frequency, hematuria and urgency.  Musculoskeletal: Negative for back pain, joint pain, myalgias and neck pain.  Skin: Negative for rash.  Neurological: Negative for dizziness, tingling, sensory change, focal weakness, syncope, weakness, headaches and loss of balance.  Endo/Heme/Allergies: Negative for environmental allergies and  polydipsia. Does not bruise/bleed easily.  Psychiatric/Behavioral: Negative for depression, memory loss and suicidal ideas. The patient is not nervous/anxious and does not have insomnia.      Objective  Vitals:   12/16/16 1052  BP: 130/88  Pulse: 60  Weight: (!) 342 lb (155.1 kg)  Height: 6\' 4"  (1.93 m)    Physical Exam  Constitutional: He is oriented to person, place, and time and well-developed, well-nourished, and in no distress.  HENT:  Head: Normocephalic.  Right Ear: External ear normal.  Left Ear: External ear normal.  Nose: Nose normal.  Mouth/Throat: Oropharynx is clear and moist.  Eyes: Pupils are equal, round, and reactive to light. Conjunctivae and EOM are normal. Right eye exhibits no discharge. Left eye exhibits no discharge. No scleral icterus.  Neck: Normal range of motion. Neck supple. No JVD present. No tracheal deviation present. No thyromegaly present.  Cardiovascular: Normal rate, regular rhythm, normal heart sounds and intact distal pulses.  Exam reveals no gallop and no friction rub.   No murmur heard. Pulmonary/Chest: Breath sounds normal. No respiratory distress. He has no wheezes. He has no rales.  Abdominal: Soft. Bowel sounds are normal. He exhibits no mass.  There is no hepatosplenomegaly. There is no tenderness. There is no rebound, no guarding and no CVA tenderness.  Genitourinary: Rectum normal and prostate normal. Rectal exam shows guaiac negative stool.  Musculoskeletal: Normal range of motion. He exhibits no edema or tenderness.  Lymphadenopathy:    He has no cervical adenopathy.  Neurological: He is alert and oriented to person, place, and time. He has normal sensation, normal strength, normal reflexes and intact cranial nerves. No cranial nerve deficit.  Skin: Skin is warm. No rash noted.  Psychiatric: Mood and affect normal.  Nursing note and vitals reviewed.     Assessment & Plan  Problem List Items Addressed This Visit       Cardiovascular and Mediastinum   Essential (primary) hypertension   Relevant Medications   amLODipine (NORVASC) 5 MG tablet   fenofibrate 160 MG tablet   metoprolol succinate (TOPROL-XL) 100 MG 24 hr tablet   ramipril (ALTACE) 10 MG capsule   simvastatin (ZOCOR) 40 MG tablet     Other   Familial multiple lipoprotein-type hyperlipidemia   Relevant Medications   amLODipine (NORVASC) 5 MG tablet   fenofibrate 160 MG tablet   metoprolol succinate (TOPROL-XL) 100 MG 24 hr tablet   niacin 500 MG CR capsule   ramipril (ALTACE) 10 MG capsule   simvastatin (ZOCOR) 40 MG tablet    Other Visit Diagnoses    TIA (transient ischemic attack)    -  Primary   Relevant Medications   amLODipine (NORVASC) 5 MG tablet   clopidogrel (PLAVIX) 75 MG tablet   fenofibrate 160 MG tablet   metoprolol succinate (TOPROL-XL) 100 MG 24 hr tablet   ramipril (ALTACE) 10 MG capsule   simvastatin (ZOCOR) 40 MG tablet   Other Relevant Orders   Lipid Profile   Essential hypertension       Relevant Medications   amLODipine (NORVASC) 5 MG tablet   fenofibrate 160 MG tablet   metoprolol succinate (TOPROL-XL) 100 MG 24 hr tablet   ramipril (ALTACE) 10 MG capsule   simvastatin (ZOCOR) 40 MG tablet   Other Relevant Orders   Renal Function Panel   Taking medication for chronic disease       Relevant Orders   Hepatic function panel   CBC w/Diff/Platelet   Cerebral vascular disease       Relevant Medications   amLODipine (NORVASC) 5 MG tablet   fenofibrate 160 MG tablet   metoprolol succinate (TOPROL-XL) 100 MG 24 hr tablet   ramipril (ALTACE) 10 MG capsule   simvastatin (ZOCOR) 40 MG tablet   Need for 23-polyvalent pneumococcal polysaccharide vaccine       Relevant Orders   Pneumococcal polysaccharide vaccine 23-valent greater than or equal to 2yo subcutaneous/IM (Completed)   Colon cancer screening       Relevant Orders   POCT occult blood stool (Completed)      Meds ordered this encounter   Medications  . amLODipine (NORVASC) 5 MG tablet    Sig: Take 1 tablet (5 mg total) by mouth daily.    Dispense:  90 tablet    Refill:  1  . clopidogrel (PLAVIX) 75 MG tablet    Sig: TAKE 1 TABLET BY MOUTH EVERY DAY AT 6:00AM    Dispense:  90 tablet    Refill:  1  . fenofibrate 160 MG tablet    Sig: Take 1 tablet (160 mg total) by mouth daily.    Dispense:  90 tablet    Refill:  1  .  metoprolol succinate (TOPROL-XL) 100 MG 24 hr tablet    Sig: TAKE 1 TABLET BY MOUTH EVERY DAY IMMEDIATELY FOLLOWING A MEAL    Dispense:  90 tablet    Refill:  1  . niacin 500 MG CR capsule    Sig: Take 1 capsule (500 mg total) by mouth daily at 6 (six) AM.    Dispense:  90 capsule    Refill:  1  . ramipril (ALTACE) 10 MG capsule    Sig: Take 1 capsule (10 mg total) by mouth daily.    Dispense:  90 capsule    Refill:  1  . simvastatin (ZOCOR) 40 MG tablet    Sig: TAKE 1 TABLET BY MOUTH EVERY DAY AT 6AM    Dispense:  90 tablet    Refill:  1      Dr. Deanna Jones [facility]  12/16/16

## 2016-12-17 ENCOUNTER — Other Ambulatory Visit: Payer: Self-pay | Admitting: Family Medicine

## 2016-12-17 LAB — CBC WITH DIFFERENTIAL/PLATELET
BASOS ABS: 0.1 10*3/uL (ref 0.0–0.2)
Basos: 1 %
EOS (ABSOLUTE): 0.2 10*3/uL (ref 0.0–0.4)
Eos: 2 %
HEMOGLOBIN: 14.1 g/dL (ref 13.0–17.7)
Hematocrit: 42.2 % (ref 37.5–51.0)
IMMATURE GRANS (ABS): 0.1 10*3/uL (ref 0.0–0.1)
Immature Granulocytes: 1 %
LYMPHS: 31 %
Lymphocytes Absolute: 3.1 10*3/uL (ref 0.7–3.1)
MCH: 30.2 pg (ref 26.6–33.0)
MCHC: 33.4 g/dL (ref 31.5–35.7)
MCV: 90 fL (ref 79–97)
MONOCYTES: 9 %
Monocytes Absolute: 0.9 10*3/uL (ref 0.1–0.9)
NEUTROS PCT: 56 %
Neutrophils Absolute: 5.8 10*3/uL (ref 1.4–7.0)
PLATELETS: 235 10*3/uL (ref 150–379)
RBC: 4.67 x10E6/uL (ref 4.14–5.80)
RDW: 13.8 % (ref 12.3–15.4)
WBC: 10 10*3/uL (ref 3.4–10.8)

## 2016-12-17 LAB — RENAL FUNCTION PANEL
ALBUMIN: 4.4 g/dL (ref 3.6–4.8)
BUN/Creatinine Ratio: 13 (ref 10–24)
BUN: 29 mg/dL — ABNORMAL HIGH (ref 8–27)
CHLORIDE: 108 mmol/L — AB (ref 96–106)
CO2: 19 mmol/L — AB (ref 20–29)
Calcium: 9.7 mg/dL (ref 8.6–10.2)
Creatinine, Ser: 2.21 mg/dL — ABNORMAL HIGH (ref 0.76–1.27)
GFR calc non Af Amer: 29 mL/min/{1.73_m2} — ABNORMAL LOW (ref >59)
GFR, EST AFRICAN AMERICAN: 34 mL/min/{1.73_m2} — AB (ref >59)
GLUCOSE: 84 mg/dL (ref 65–99)
PHOSPHORUS: 3.3 mg/dL (ref 2.5–4.5)
POTASSIUM: 4.8 mmol/L (ref 3.5–5.2)
Sodium: 145 mmol/L — ABNORMAL HIGH (ref 134–144)

## 2016-12-17 LAB — LIPID PANEL
Chol/HDL Ratio: 6 ratio — ABNORMAL HIGH (ref 0.0–5.0)
Cholesterol, Total: 145 mg/dL (ref 100–199)
HDL: 24 mg/dL — AB (ref >39)
LDL Calculated: 73 mg/dL (ref 0–99)
Triglycerides: 240 mg/dL — ABNORMAL HIGH (ref 0–149)
VLDL Cholesterol Cal: 48 mg/dL — ABNORMAL HIGH (ref 5–40)

## 2016-12-17 LAB — HEPATIC FUNCTION PANEL
ALT: 18 IU/L (ref 0–44)
AST: 18 IU/L (ref 0–40)
Alkaline Phosphatase: 96 IU/L (ref 39–117)
Bilirubin Total: 0.5 mg/dL (ref 0.0–1.2)
Bilirubin, Direct: 0.18 mg/dL (ref 0.00–0.40)
Total Protein: 7.5 g/dL (ref 6.0–8.5)

## 2016-12-20 ENCOUNTER — Other Ambulatory Visit: Payer: Self-pay | Admitting: Family Medicine

## 2017-02-05 ENCOUNTER — Other Ambulatory Visit: Payer: Self-pay | Admitting: Family Medicine

## 2017-02-06 ENCOUNTER — Other Ambulatory Visit: Payer: Self-pay | Admitting: Family Medicine

## 2017-02-10 ENCOUNTER — Other Ambulatory Visit: Payer: Self-pay | Admitting: Family Medicine

## 2017-02-25 ENCOUNTER — Other Ambulatory Visit: Payer: Self-pay | Admitting: Family Medicine

## 2017-02-28 DIAGNOSIS — E6609 Other obesity due to excess calories: Secondary | ICD-10-CM | POA: Diagnosis not present

## 2017-02-28 DIAGNOSIS — N200 Calculus of kidney: Secondary | ICD-10-CM | POA: Diagnosis not present

## 2017-02-28 DIAGNOSIS — Z6832 Body mass index (BMI) 32.0-32.9, adult: Secondary | ICD-10-CM | POA: Diagnosis not present

## 2017-02-28 DIAGNOSIS — N183 Chronic kidney disease, stage 3 (moderate): Secondary | ICD-10-CM | POA: Diagnosis not present

## 2017-02-28 DIAGNOSIS — I129 Hypertensive chronic kidney disease with stage 1 through stage 4 chronic kidney disease, or unspecified chronic kidney disease: Secondary | ICD-10-CM | POA: Diagnosis not present

## 2017-02-28 DIAGNOSIS — M170 Bilateral primary osteoarthritis of knee: Secondary | ICD-10-CM | POA: Diagnosis not present

## 2017-03-14 ENCOUNTER — Other Ambulatory Visit: Payer: Self-pay | Admitting: Family Medicine

## 2017-04-14 ENCOUNTER — Other Ambulatory Visit: Payer: Self-pay | Admitting: Family Medicine

## 2017-05-12 ENCOUNTER — Other Ambulatory Visit: Payer: Self-pay | Admitting: Family Medicine

## 2017-05-20 ENCOUNTER — Encounter: Payer: Self-pay | Admitting: Family Medicine

## 2017-05-20 ENCOUNTER — Ambulatory Visit (INDEPENDENT_AMBULATORY_CARE_PROVIDER_SITE_OTHER): Payer: Medicare Other | Admitting: Family Medicine

## 2017-05-20 VITALS — BP 130/80 | HR 80 | Ht 76.0 in | Wt 335.0 lb

## 2017-05-20 DIAGNOSIS — R0600 Dyspnea, unspecified: Secondary | ICD-10-CM

## 2017-05-20 DIAGNOSIS — R351 Nocturia: Secondary | ICD-10-CM | POA: Diagnosis not present

## 2017-05-20 DIAGNOSIS — R0609 Other forms of dyspnea: Secondary | ICD-10-CM

## 2017-05-20 DIAGNOSIS — Z8673 Personal history of transient ischemic attack (TIA), and cerebral infarction without residual deficits: Secondary | ICD-10-CM | POA: Diagnosis not present

## 2017-05-20 DIAGNOSIS — I739 Peripheral vascular disease, unspecified: Secondary | ICD-10-CM

## 2017-05-20 DIAGNOSIS — I2510 Atherosclerotic heart disease of native coronary artery without angina pectoris: Secondary | ICD-10-CM | POA: Diagnosis not present

## 2017-05-20 NOTE — Progress Notes (Signed)
Name: Christopher [name]   MRN: [id number]    DOB: Jan 23, 1946   Date:05/20/2017       Progress Note  Subjective  Chief Complaint  Chief Complaint  Patient presents with  . Nocturia    feeling "winded" when wakes up to go urinate. Cut the heat back last night, didn't have SOB when got up during the night.   . Allergic Rhinitis     little cough, eyes watering    Heart Problem  This is a chronic problem. The current episode started more than 1 year ago. The problem occurs intermittently. The problem has been waxing and waning. Associated symptoms include urinary symptoms. Pertinent negatives include no abdominal pain, anorexia, arthralgias, change in bowel habit, chest pain, chills, congestion, coughing, diaphoresis, fatigue, fever, headaches, joint swelling, myalgias, nausea, neck pain, numbness, rash, sore throat, swollen glands, vertigo, visual change, vomiting or weakness. Associated symptoms comments: nocturia. The symptoms are aggravated by walking.  Congestive Heart Failure  Presents for initial visit. The disease course has been fluctuating. Associated symptoms include nocturia, orthopnea, paroxysmal nocturnal dyspnea and shortness of breath. Pertinent negatives include no abdominal pain, chest pain, chest pressure, claudication, edema, fatigue, muscle weakness, near-syncope or palpitations. The symptoms have been worsening. Past treatments include ACE inhibitors and beta blockers (amlodipine). Compliance with prior treatments has been good. His past medical history is significant for CAD, CVA, DM and HTN. There is no history of hyperthyroidism, myocarditis or valvular heart disease. (TIA/PVD)    No problem-specific Assessment & Plan notes found for this encounter.   Past Medical History:  Diagnosis Date  . Hyperlipidemia   . Hypertension   . Myocardial infarction [facility])     Past Surgical History:  Procedure Laterality Date  . COLONOSCOPY  2011  . KNEE ARTHROSCOPY Right 2016     Family History  Problem Relation Age of Onset  . Cancer Father   . Heart disease Father   . Diabetes Paternal Grandmother     Social History   Socioeconomic History  . Marital status: Widowed    Spouse name: Not on file  . Number of children: Not on file  . Years of education: Not on file  . Highest education level: Not on file  Social Needs  . Financial resource strain: Not on file  . Food insecurity - worry: Not on file  . Food insecurity - inability: Not on file  . Transportation needs - medical: Not on file  . Transportation needs - non-medical: Not on file  Occupational History  . Not on file  Tobacco Use  . Smoking status: Former Research scientist (life sciences)  . Smokeless tobacco: Never Used  Substance and Sexual Activity  . Alcohol use: No    Alcohol/week: 0.0 oz  . Drug use: No  . Sexual activity: No  Other Topics Concern  . Not on file  Social History Narrative  . Not on file    No Known Allergies  Outpatient Medications Prior to Visit  Medication Sig Dispense Refill  . amLODipine (NORVASC) 5 MG tablet Take 1 tablet (5 mg total) by mouth daily. 90 tablet 1  . Cholecalciferol (VITAMIN D) 2000 units tablet Take by mouth.    . clopidogrel (PLAVIX) 75 MG tablet TAKE 1 TABLET BY MOUTH EVERY DAY AT 6:00AM 90 tablet 1  . diclofenac sodium (VOLTAREN) 1 % GEL Apply 4 g topically 4 (four) times daily. (Patient taking differently: Apply 4 g topically 4 (four) times daily. Dr Marry Guan) 1 Tube 1  .  etodolac (LODINE) 500 MG tablet TAKE 1 TABLET BY MOUTH TWICE DAILY 60 tablet 0  . fenofibrate 160 MG tablet Take 1 tablet (160 mg total) by mouth daily. 90 tablet 1  . metoprolol succinate (TOPROL-XL) 100 MG 24 hr tablet TAKE 1 TABLET BY MOUTH EVERY DAY IMMEDIATELY FOLLOWING A MEAL 90 tablet 1  . niacin 500 MG CR capsule Take 1 capsule (500 mg total) by mouth daily at 6 (six) AM. 90 capsule 1  . ramipril (ALTACE) 10 MG capsule Take 1 capsule (10 mg total) by mouth daily. 90 capsule 1  .  simvastatin (ZOCOR) 40 MG tablet TAKE 1 TABLET BY MOUTH EVERY DAY AT 6AM 90 tablet 1  . clopidogrel (PLAVIX) 75 MG tablet TAKE 1 TABLET BY MOUTH EVERY DAY AT 6 AM. 90 tablet 1  . clopidogrel (PLAVIX) 75 MG tablet TAKE 1 TABLET BY MOUTH EVERY DAY AT 6:00AM 90 tablet 0  . fenofibrate 160 MG tablet TAKE 1 TABLET BY MOUTH EVERY DAY 90 tablet 1  . ramipril (ALTACE) 10 MG capsule TAKE ONE CAPSULE BY MOUTH DAILY 90 capsule 0  . simvastatin (ZOCOR) 40 MG tablet TAKE 1 TABLET BY MOUTH EVERY DAY AT 6 AM 90 tablet 0   No facility-administered medications prior to visit.     Review of Systems  Constitutional: Negative for chills, diaphoresis, fatigue, fever, malaise/fatigue and weight loss.  HENT: Negative for congestion, ear discharge, ear pain and sore throat.   Eyes: Negative for blurred vision.  Respiratory: Positive for shortness of breath. Negative for cough, sputum production and wheezing.   Cardiovascular: Positive for orthopnea and PND. Negative for chest pain, palpitations, claudication, leg swelling and near-syncope.  Gastrointestinal: Negative for abdominal pain, anorexia, blood in stool, change in bowel habit, constipation, diarrhea, heartburn, melena, nausea and vomiting.  Genitourinary: Positive for nocturia. Negative for dysuria, frequency, hematuria and urgency.       Nocturia  Musculoskeletal: Negative for arthralgias, back pain, joint pain, joint swelling, myalgias, muscle weakness and neck pain.  Skin: Negative for rash.  Neurological: Negative for dizziness, vertigo, tingling, sensory change, focal weakness, weakness, numbness and headaches.  Endo/Heme/Allergies: Negative for environmental allergies and polydipsia. Does not bruise/bleed easily.  Psychiatric/Behavioral: Negative for depression and suicidal ideas. The patient is not nervous/anxious and does not have insomnia.      Objective  Vitals:   05/20/17 0856  BP: 130/80  Pulse: 80  Weight: (!) 335 lb (152 kg)  Height:  6\' 4"  (1.93 m)    Physical Exam  Constitutional: He is oriented to person, place, and time and well-developed, well-nourished, and in no distress.  HENT:  Head: Normocephalic.  Right Ear: External ear normal.  Left Ear: External ear normal.  Nose: Nose normal.  Mouth/Throat: Oropharynx is clear and moist.  Eyes: Conjunctivae and EOM are normal. Pupils are equal, round, and reactive to light. Right eye exhibits no discharge. Left eye exhibits no discharge. No scleral icterus.  Neck: Normal range of motion. Neck supple. No JVD present. No tracheal deviation present. No thyromegaly present.  Cardiovascular: Normal rate, regular rhythm, S1 normal, S2 normal, intact distal pulses and normal pulses. PMI is not displaced. Exam reveals gallop and S4. Exam reveals no S3 and no friction rub.  Murmur heard.  Systolic murmur is present with a grade of 1/6. Pulmonary/Chest: Breath sounds normal. No respiratory distress. He has no wheezes. He has no rales.  Abdominal: Soft. Bowel sounds are normal. He exhibits no mass. There is no hepatosplenomegaly. There  is no tenderness. There is no rebound, no guarding and no CVA tenderness.  Musculoskeletal: Normal range of motion. He exhibits no edema or tenderness.  Lymphadenopathy:    He has no cervical adenopathy.  Neurological: He is alert and oriented to person, place, and time. He has normal sensation, normal strength, normal reflexes and intact cranial nerves. No cranial nerve deficit.  Skin: Skin is warm. No rash noted.  Psychiatric: Mood and affect normal.  Nursing note and vitals reviewed.     Assessment & Plan  Problem List Items Addressed This Visit    None    Visit Diagnoses    DOE (dyspnea on exertion)    -  Primary   Relevant Orders   Ambulatory referral to Cardiology   Nocturia       Relevant Orders   Ambulatory referral to Cardiology   PND (paroxysmal nocturnal dyspnea)       with orthopnea   Relevant Orders   Ambulatory referral  to Cardiology   Coronary artery disease involving native coronary artery of native heart, angina presence unspecified       Relevant Orders   Ambulatory referral to Cardiology   Hx-TIA (transient ischemic attack)       PVD (peripheral vascular disease) ([place])          No orders of the defined types were placed in this encounter.     Dr. [facility]  05/20/17

## 2017-05-25 ENCOUNTER — Telehealth: Payer: Self-pay

## 2017-05-25 DIAGNOSIS — G4719 Other hypersomnia: Secondary | ICD-10-CM | POA: Diagnosis not present

## 2017-05-25 DIAGNOSIS — I25118 Atherosclerotic heart disease of native coronary artery with other forms of angina pectoris: Secondary | ICD-10-CM | POA: Diagnosis not present

## 2017-05-25 DIAGNOSIS — R0602 Shortness of breath: Secondary | ICD-10-CM | POA: Diagnosis not present

## 2017-05-25 DIAGNOSIS — G459 Transient cerebral ischemic attack, unspecified: Secondary | ICD-10-CM | POA: Diagnosis not present

## 2017-05-25 DIAGNOSIS — I208 Other forms of angina pectoris: Secondary | ICD-10-CM | POA: Diagnosis not present

## 2017-05-25 DIAGNOSIS — I739 Peripheral vascular disease, unspecified: Secondary | ICD-10-CM | POA: Diagnosis not present

## 2017-05-25 DIAGNOSIS — M1711 Unilateral primary osteoarthritis, right knee: Secondary | ICD-10-CM | POA: Diagnosis not present

## 2017-05-25 DIAGNOSIS — I639 Cerebral infarction, unspecified: Secondary | ICD-10-CM | POA: Diagnosis not present

## 2017-05-25 DIAGNOSIS — R0989 Other specified symptoms and signs involving the circulatory and respiratory systems: Secondary | ICD-10-CM | POA: Diagnosis not present

## 2017-05-25 NOTE — Telephone Encounter (Signed)
Called pt to sched AWV w/ [facility]. LVM requesting returned call.  

## 2017-06-08 ENCOUNTER — Other Ambulatory Visit: Payer: Self-pay | Admitting: Family Medicine

## 2017-06-19 ENCOUNTER — Other Ambulatory Visit: Payer: Self-pay

## 2017-06-19 ENCOUNTER — Ambulatory Visit
Admission: EM | Admit: 2017-06-19 | Discharge: 2017-06-19 | Disposition: A | Discharge status: Home / Self Care | Payer: Medicare Other | Attending: Family Medicine | Admitting: Family Medicine

## 2017-06-19 ENCOUNTER — Ambulatory Visit: Payer: Medicare Other

## 2017-06-19 ENCOUNTER — Encounter: Payer: Self-pay | Admitting: Emergency Medicine

## 2017-06-19 DIAGNOSIS — Z951 Presence of aortocoronary bypass graft: Secondary | ICD-10-CM | POA: Diagnosis not present

## 2017-06-19 DIAGNOSIS — R0602 Shortness of breath: Secondary | ICD-10-CM | POA: Diagnosis not present

## 2017-06-19 DIAGNOSIS — Z7902 Long term (current) use of antithrombotics/antiplatelets: Secondary | ICD-10-CM | POA: Insufficient documentation

## 2017-06-19 DIAGNOSIS — I252 Old myocardial infarction: Secondary | ICD-10-CM

## 2017-06-19 DIAGNOSIS — E7800 Pure hypercholesterolemia, unspecified: Secondary | ICD-10-CM | POA: Diagnosis not present

## 2017-06-19 DIAGNOSIS — Z8673 Personal history of transient ischemic attack (TIA), and cerebral infarction without residual deficits: Secondary | ICD-10-CM | POA: Insufficient documentation

## 2017-06-19 DIAGNOSIS — E785 Hyperlipidemia, unspecified: Secondary | ICD-10-CM | POA: Diagnosis not present

## 2017-06-19 DIAGNOSIS — J189 Pneumonia, unspecified organism: Secondary | ICD-10-CM

## 2017-06-19 DIAGNOSIS — E7849 Other hyperlipidemia: Secondary | ICD-10-CM | POA: Insufficient documentation

## 2017-06-19 DIAGNOSIS — I10 Essential (primary) hypertension: Secondary | ICD-10-CM | POA: Diagnosis not present

## 2017-06-19 DIAGNOSIS — Z87891 Personal history of nicotine dependence: Secondary | ICD-10-CM | POA: Insufficient documentation

## 2017-06-19 DIAGNOSIS — Z79899 Other long term (current) drug therapy: Secondary | ICD-10-CM | POA: Diagnosis not present

## 2017-06-19 DIAGNOSIS — R05 Cough: Secondary | ICD-10-CM | POA: Diagnosis not present

## 2017-06-19 MED ORDER — AZITHROMYCIN 250 MG PO TABS: 250.0000 mg | ORAL_TABLET | Freq: Every day | ORAL | 0 refills | Status: AC

## 2017-06-19 NOTE — ED Provider Notes (Signed)
[facility]    CSN: [id number] Arrival date & time: 06/19/17  1142     History   Chief Complaint Chief Complaint  Patient presents with  . Cough    HPI Christopher [name] is a 72 y.o. male.   With history of hypertension, hyperlipidemia, TIA and MI s/p CABG, presents today for cough, chest congestion and fever x 3 days. Fever was 101.1 this morning and took some tylenol. Cough is productive with some shortness of breath. O2Sat noted to be 93%. His last appointment on file was on 03/13 with cardiologist and at that time his O2Sat was 99%. He is a non-smoker. He denies sinus pain, ear pain, sore throat, nasal congestion, headache, body aches, rash. He denies chest pain. He did have chill last night. Reports the symptom are worsening. PCP is Dr. Ronnald Ramp. Cardiologist is Dr. Clayborn Bigness with [facility].     The history is provided by the patient.    Past Medical History:  Diagnosis Date  . Hyperlipidemia   . Hypertension   . Myocardial infarction [facility])     Patient Active Problem List   Diagnosis Date Noted  . 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 Encounter for general adult medical examination without abnormal findings 10/22/2014  . Disorder of kidney and ureter 10/22/2014  . Renal colic 36/14/4315  . 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 Creatinine elevation 10/22/2014  . Essential (primary) hypertension 10/22/2014  . H/O hypercholesterolemia 10/22/2014  . Dysmetabolic syndrome 40/10/6759  . H/O acute myocardial infarction 10/22/2014  . Adiposity 10/22/2014  . Impaired renal function 10/22/2014  . Need for vaccination 10/22/2014    Past Surgical History:  Procedure Laterality Date  . COLONOSCOPY  2011  . KNEE ARTHROSCOPY Right 2016       Home Medications    Prior to Admission medications   Medication Sig Start Date End Date Taking? Authorizing Provider  amLODipine (NORVASC) 5 MG tablet Take 1 tablet (5 mg total) by mouth daily.  12/16/16  Yes Juline Patch, MD  Cholecalciferol (VITAMIN D) 2000 units tablet Take by mouth. 12/01/15  Yes [provider]  etodolac (LODINE) 500 MG tablet TAKE 1 TABLET BY MOUTH TWICE DAILY 06/08/17  Yes Juline Patch, MD  fenofibrate 160 MG tablet Take 1 tablet (160 mg total) by mouth daily. 12/16/16  Yes Juline Patch, MD  metoprolol succinate (TOPROL-XL) 100 MG 24 hr tablet TAKE 1 TABLET BY MOUTH EVERY DAY IMMEDIATELY FOLLOWING A MEAL 12/16/16  Yes Juline Patch, MD  niacin 500 MG CR capsule Take 1 capsule (500 mg total) by mouth daily at 6 (six) AM. 12/16/16  Yes Juline Patch, MD  ramipril (ALTACE) 10 MG capsule Take 1 capsule (10 mg total) by mouth daily. 12/16/16  Yes Juline Patch, MD  simvastatin (ZOCOR) 40 MG tablet TAKE 1 TABLET BY MOUTH EVERY DAY AT 6AM 12/16/16  Yes Juline Patch, MD  azithromycin (ZITHROMAX) 250 MG tablet Take 1 tablet (250 mg total) by mouth daily for 5 days. Take first 2 tablets together, then 1 every day until finished. 06/19/17 06/24/17  Barry Dienes, NP  clopidogrel (PLAVIX) 75 MG tablet TAKE 1 TABLET BY MOUTH EVERY DAY AT 6:00AM 12/16/16   Juline Patch, MD  diclofenac sodium (VOLTAREN) 1 % GEL Apply 4 g topically 4 (four) times daily. Patient taking differently: Apply 4 g topically 4 (four) times daily. Dr Marry Guan 10/24/14   Juline Patch, MD    Family  History Family History  Problem Relation Age of Onset  . Cancer Father   . Heart disease Father   . Diabetes Paternal Grandmother     Social History Social History   Tobacco Use  . Smoking status: Former Research scientist (life sciences)  . Smokeless tobacco: Never Used  Substance Use Topics  . Alcohol use: No    Alcohol/week: 0.0 oz  . Drug use: No     Allergies   Patient has no known allergies.   Review of Systems Review of Systems  Constitutional: Positive for chills and fever.  HENT: Negative for congestion, sinus pressure, sinus pain, sneezing and sore throat.   Respiratory: Positive for cough  and shortness of breath. Negative for wheezing.   Cardiovascular: Negative for chest pain and palpitations.  Gastrointestinal: Negative for abdominal pain, nausea and vomiting.  Neurological: Negative for dizziness and headaches.     Physical Exam Triage Vital Signs ED Triage Vitals  Enc Vitals Group     BP 06/19/17 1155 104/73     Pulse Rate 06/19/17 1155 (!) 103     Resp 06/19/17 1155 17     Temp 06/19/17 1155 98 F (36.7 C)     Temp Source 06/19/17 1155 Oral     SpO2 06/19/17 1155 (S) 93 %     Weight 06/19/17 1151 (!) 325 lb (147.4 kg)     Height 06/19/17 1151 6\' 4"  (1.93 m)     Head Circumference --      Peak Flow --      Pain Score 06/19/17 1151 7     Pain Loc --      Pain Edu? --      Excl. in [place]? --    No data found.  Updated Vital Signs BP 104/73 (BP Location: Left Arm)   Pulse (!) 103   Temp 98 F (36.7 C) (Oral)   Resp 17   Ht 6\' 4"  (1.93 m)   Wt (!) 325 lb (147.4 kg)   SpO2 (S) 93%   BMI 39.56 kg/m   Physical Exam  Constitutional: He is oriented to person, place, and time. He appears well-developed and well-nourished. No distress.  HENT:  Head: Normocephalic and atraumatic.  Right Ear: External ear normal.  Left Ear: External ear normal.  Nose: Nose normal.  Mouth/Throat: Oropharynx is clear and moist. No oropharyngeal exudate.  TM pearly gray with no erythema  Eyes: Pupils are equal, round, and reactive to light. Conjunctivae and EOM are normal.  Neck: Normal range of motion. Neck supple.  Cardiovascular: Normal rate, regular rhythm and normal heart sounds.  No murmur heard. Pulmonary/Chest: Effort normal. He has rales.  O2Sat is 93%.   Abdominal: Soft. Bowel sounds are normal. There is no tenderness.  Musculoskeletal: Normal range of motion.  Lymphadenopathy:    He has no cervical adenopathy.  Neurological: He is alert and oriented to person, place, and time.  Skin: Skin is warm and dry. He is not diaphoretic.  Nursing note and vitals  reviewed.   UC Treatments / Results  Labs (all labs ordered are listed, but only abnormal results are displayed) Labs Reviewed - No data to display  EKG None Radiology Dg Chest 2 View  Result Date: 06/19/2017 CLINICAL DATA:  Cough for 1 week worsening over the last 3 days. Shortness of breath and fever. Hypoxia. EXAM: CHEST - 2 VIEW COMPARISON:  Overlapping portions of CT abdomen from 09/07/2013. FINDINGS: Mild cardiomegaly. Ill-defined nodular density in the left mid lung could be  related to an underlying rib lesion but a true intrapulmonary lesion is not excluded. Interstitial accentuation at the lung bases. Thoracic spondylosis with bridging spurring. No pleural effusion. IMPRESSION: 1. Indistinct nodularity in the left mid lung. CT chest is recommended in order to exclude a neoplastic pulmonary nodule. 2. Mild interstitial accentuation in the lung bases. Although nonspecific this could be a manifestation of atypical infectious process. This could also be further characterized at CT. 3. Mild cardiomegaly. 4. Thoracic spondylosis. Electronically Signed   By: Van Clines M.D.   On: 06/19/2017 12:31    Procedures Procedures (including critical care time)  Medications Ordered in UC Medications - No data to display   Initial Impression / Assessment and Plan / UC Course  I have reviewed the triage vital signs and the nursing notes.  Pertinent labs & imaging results that were available during my care of the patient were reviewed by me and considered in my medical decision making (see chart for details).  12:12: Patient in no acute distress. Will obtain chest xray.   Final Clinical Impressions(s) / UC Diagnoses   Final diagnoses:  Community acquired pneumonia, unspecified laterality   Chest xray result reviewed and discussed with patient. Will treat with azithromycin. RX send in. Recommended to f/u in 24 hours with Dr. Ronnald Ramp for re-evaluation and to further discuss the need for CT  chest scan. Call tomorrow to Dr. Adah Salvage office and schedule an appointment. All questions answered.   ED Discharge Orders        Ordered    azithromycin (ZITHROMAX) 250 MG tablet  Daily     06/19/17 1239     Controlled Substance Prescriptions [place] Controlled Substance Registry consulted? Not Applicable   Barry Dienes, NP 06/19/17 1256

## 2017-06-19 NOTE — ED Triage Notes (Signed)
Patient c/o cough, chest congestion and fevers for the past 3 days.

## 2017-06-21 ENCOUNTER — Encounter: Payer: Self-pay | Admitting: Family Medicine

## 2017-06-21 ENCOUNTER — Ambulatory Visit (INDEPENDENT_AMBULATORY_CARE_PROVIDER_SITE_OTHER): Payer: Medicare Other | Admitting: Family Medicine

## 2017-06-21 VITALS — BP 124/76 | HR 104 | Temp 98.3°F | Ht 76.0 in | Wt 326.0 lb

## 2017-06-21 DIAGNOSIS — J181 Lobar pneumonia, unspecified organism: Secondary | ICD-10-CM

## 2017-06-21 DIAGNOSIS — R911 Solitary pulmonary nodule: Secondary | ICD-10-CM | POA: Diagnosis not present

## 2017-06-21 DIAGNOSIS — R918 Other nonspecific abnormal finding of lung field: Secondary | ICD-10-CM

## 2017-06-21 DIAGNOSIS — J189 Pneumonia, unspecified organism: Secondary | ICD-10-CM

## 2017-06-21 DIAGNOSIS — I517 Cardiomegaly: Secondary | ICD-10-CM

## 2017-06-21 DIAGNOSIS — I2510 Atherosclerotic heart disease of native coronary artery without angina pectoris: Secondary | ICD-10-CM | POA: Diagnosis not present

## 2017-06-21 MED ORDER — GUAIFENESIN-CODEINE 100-10 MG/5ML PO SYRP: 5.0000 mL | ORAL_SOLUTION | Freq: Three times a day (TID) | ORAL | 0 refills | Status: DC | PRN

## 2017-06-21 NOTE — Progress Notes (Addendum)
Name: Christopher [name]   MRN: [id number]    DOB: [birth year]   Date:06/21/2017       Progress Note  Subjective  Chief Complaint  Chief Complaint  Patient presents with  . Follow-up    DX. pneumonia on 4/7- on day 3 of ZPack. O2 has improved.    Pneumonia  He complains of cough, hemoptysis, shortness of breath, sputum production and wheezing. There is no chest tightness, difficulty breathing, frequent throat clearing or hoarse voice. This is a new problem. The current episode started in the past 7 days. The problem occurs intermittently. The problem has been gradually improving. The cough is productive of purulent sputum, productive and supine. Pertinent negatives include no appetite change, chest pain, dyspnea on exertion, ear congestion, ear pain, fever, headaches, heartburn, malaise/fatigue, myalgias, nasal congestion, orthopnea, PND, postnasal drip, rhinorrhea, sneezing, sore throat, sweats, trouble swallowing or weight loss. His symptoms are alleviated by diuretics. He reports moderate improvement on treatment.    No problem-specific Assessment & Plan notes found for this encounter.   Past Medical History:  Diagnosis Date  . Hyperlipidemia   . Hypertension   . Myocardial infarction [facility])     Past Surgical History:  Procedure Laterality Date  . COLONOSCOPY  2011  . KNEE ARTHROSCOPY Right 2016    Family History  Problem Relation Age of Onset  . Cancer Father   . Heart disease Father   . Diabetes Paternal Grandmother     Social History   Socioeconomic History  . Marital status: Widowed    Spouse name: Not on file  . Number of children: Not on file  . Years of education: Not on file  . Highest education level: Not on file  Occupational History  . Not on file  Social Needs  . Financial resource strain: Not on file  . Food insecurity:    Worry: Not on file    Inability: Not on file  . Transportation needs:    Medical: Not on file    Non-medical: Not on file   Tobacco Use  . Smoking status: Former Research scientist (life sciences)  . Smokeless tobacco: Never Used  Substance and Sexual Activity  . Alcohol use: No    Alcohol/week: 0.0 oz  . Drug use: No  . Sexual activity: Never  Lifestyle  . Physical activity:    Days per week: Not on file    Minutes per session: Not on file  . Stress: Not on file  Relationships  . Social connections:    Talks on phone: Not on file    Gets together: Not on file    Attends religious service: Not on file    Active member of club or organization: Not on file    Attends meetings of clubs or organizations: Not on file    Relationship status: Not on file  . Intimate partner violence:    Fear of current or ex partner: Not on file    Emotionally abused: Not on file    Physically abused: Not on file    Forced sexual activity: Not on file  Other Topics Concern  . Not on file  Social History Narrative  . Not on file    No Known Allergies  Outpatient Medications Prior to Visit  Medication Sig Dispense Refill  . amLODipine (NORVASC) 5 MG tablet Take 1 tablet (5 mg total) by mouth daily. 90 tablet 1  . azithromycin (ZITHROMAX) 250 MG tablet Take 1 tablet (250 mg total) by mouth daily  for 5 days. Take first 2 tablets together, then 1 every day until finished. 6 tablet 0  . Cholecalciferol (VITAMIN D) 2000 units tablet Take by mouth.    . clopidogrel (PLAVIX) 75 MG tablet TAKE 1 TABLET BY MOUTH EVERY DAY AT 6:00AM 90 tablet 1  . diclofenac sodium (VOLTAREN) 1 % GEL Apply 4 g topically 4 (four) times daily. (Patient taking differently: Apply 4 g topically 4 (four) times daily. Dr Marry Guan) 1 Tube 1  . etodolac (LODINE) 500 MG tablet TAKE 1 TABLET BY MOUTH TWICE DAILY 60 tablet 0  . fenofibrate 160 MG tablet Take 1 tablet (160 mg total) by mouth daily. 90 tablet 1  . metoprolol succinate (TOPROL-XL) 100 MG 24 hr tablet TAKE 1 TABLET BY MOUTH EVERY DAY IMMEDIATELY FOLLOWING A MEAL 90 tablet 1  . niacin 500 MG CR capsule Take 1 capsule (500  mg total) by mouth daily at 6 (six) AM. 90 capsule 1  . ramipril (ALTACE) 10 MG capsule Take 1 capsule (10 mg total) by mouth daily. 90 capsule 1  . simvastatin (ZOCOR) 40 MG tablet TAKE 1 TABLET BY MOUTH EVERY DAY AT 6AM 90 tablet 1   No facility-administered medications prior to visit.     Review of Systems  Constitutional: Negative for appetite change, fever, malaise/fatigue and weight loss.  HENT: Negative for ear pain, hoarse voice, postnasal drip, rhinorrhea, sneezing, sore throat and trouble swallowing.   Respiratory: Positive for cough, hemoptysis, sputum production, shortness of breath and wheezing.   Cardiovascular: Negative for chest pain, dyspnea on exertion and PND.  Gastrointestinal: Negative for heartburn.  Musculoskeletal: Negative for myalgias.  Neurological: Negative for headaches.     Objective  Vitals:   06/21/17 1046  BP: 124/76  Pulse: (!) 104  Temp: 98.3 F (36.8 C)  TempSrc: Oral  SpO2: 98%  Weight: (!) 326 lb (147.9 kg)  Height: 6\' 4"  (1.93 m)    Physical Exam  Constitutional: He is oriented to person, place, and time and well-developed, well-nourished, and in no distress.  HENT:  Head: Normocephalic.  Right Ear: External ear normal.  Left Ear: External ear normal.  Nose: Nose normal.  Mouth/Throat: Oropharynx is clear and moist.  Eyes: Pupils are equal, round, and reactive to light. Conjunctivae and EOM are normal. Right eye exhibits no discharge. Left eye exhibits no discharge. No scleral icterus.  Neck: Normal range of motion. Neck supple. No JVD present. No tracheal deviation present. No thyromegaly present.  Cardiovascular: Normal rate, regular rhythm, S1 normal, S2 normal, normal heart sounds, intact distal pulses and normal pulses. PMI is not displaced. Exam reveals no gallop, no S3, no S4 and no friction rub.  No murmur heard. Pulmonary/Chest: No respiratory distress. He has decreased breath sounds in the right lower field and the left  lower field. He has no wheezes. He has rales in the right lower field and the left lower field.  Abdominal: Soft. Bowel sounds are normal. He exhibits no mass. There is no hepatosplenomegaly. There is no tenderness. There is no rebound, no guarding and no CVA tenderness.  Musculoskeletal: Normal range of motion. He exhibits no edema or tenderness.  Lymphadenopathy:    He has no cervical adenopathy.  Neurological: He is alert and oriented to person, place, and time. He has normal sensation, normal strength, normal reflexes and intact cranial nerves. No cranial nerve deficit.  Skin: Skin is warm. No rash noted.  Psychiatric: Mood and affect normal.  Nursing note and vitals reviewed.  Assessment & Plan  Problem List Items Addressed This Visit    None    Visit Diagnoses    Pneumonia of left lower lobe due to infectious organism [facility])    -  Primary   Relevant Medications   guaiFENesin-codeine (ROBITUSSIN AC) 100-10 MG/5ML syrup   Pulmonary nodule       Relevant Orders   CT Chest W Contrast   Cardiomegaly       Abnormal findings on diagnostic imaging of lung       Relevant Orders   CT Chest W Contrast      Meds ordered this encounter  Medications  . guaiFENesin-codeine (ROBITUSSIN AC) 100-10 MG/5ML syrup    Sig: Take 5 mLs by mouth 3 (three) times daily as needed for cough.    Dispense:  150 mL    Refill:  0      Dr. Faisal Stradling [facility]  06/21/17

## 2017-06-21 NOTE — Addendum Note (Signed)
Addended by: Otilio Miu C on: 06/21/2017 11:14 AM   Modules accepted: Orders

## 2017-06-26 ENCOUNTER — Other Ambulatory Visit: Payer: Self-pay | Admitting: Family Medicine

## 2017-06-26 DIAGNOSIS — I10 Essential (primary) hypertension: Secondary | ICD-10-CM

## 2017-06-27 ENCOUNTER — Other Ambulatory Visit: Payer: Self-pay | Admitting: Family Medicine

## 2017-06-29 ENCOUNTER — Encounter: Payer: Self-pay | Admitting: Family Medicine

## 2017-06-29 ENCOUNTER — Ambulatory Visit (INDEPENDENT_AMBULATORY_CARE_PROVIDER_SITE_OTHER): Payer: Medicare Other | Admitting: Family Medicine

## 2017-06-29 VITALS — BP 120/80 | HR 78 | Ht 76.0 in | Wt 311.0 lb

## 2017-06-29 DIAGNOSIS — I2510 Atherosclerotic heart disease of native coronary artery without angina pectoris: Secondary | ICD-10-CM

## 2017-06-29 DIAGNOSIS — R911 Solitary pulmonary nodule: Secondary | ICD-10-CM | POA: Diagnosis not present

## 2017-06-29 DIAGNOSIS — Z1211 Encounter for screening for malignant neoplasm of colon: Secondary | ICD-10-CM | POA: Diagnosis not present

## 2017-06-29 DIAGNOSIS — T790XXS Air embolism (traumatic), sequela: Secondary | ICD-10-CM

## 2017-06-29 NOTE — Progress Notes (Signed)
Name: Christopher [name]   MRN: [id number]    DOB: Jun 12, 1945   Date:06/29/2017       Progress Note  Subjective  Chief Complaint  Chief Complaint  Patient presents with  . Follow-up    pneumonia- feeling much better    Pneumonia  He complains of cough. There is no chest tightness, difficulty breathing, frequent throat clearing, hemoptysis, hoarse voice, shortness of breath, sputum production or wheezing. Chronicity: followup. The current episode started 1 to 4 weeks ago. The problem has been gradually improving. The cough is productive of sputum. Associated symptoms include appetite change and weight loss. Pertinent negatives include no chest pain, dyspnea on exertion, ear congestion, ear pain, fever, headaches, heartburn, malaise/fatigue, myalgias, PND, postnasal drip, rhinorrhea, sneezing, sore throat or sweats. Associated symptoms comments: 11 pound weight loss. His symptoms are aggravated by nothing. Relieved by: azith. He reports moderate improvement on treatment. His past medical history is significant for pneumonia. There is no history of asthma, bronchiectasis, bronchitis or COPD.    No problem-specific Assessment & Plan notes found for this encounter.   Past Medical History:  Diagnosis Date  . Hyperlipidemia   . Hypertension   . Myocardial infarction [facility])     Past Surgical History:  Procedure Laterality Date  . COLONOSCOPY  2011  . KNEE ARTHROSCOPY Right 2016    Family History  Problem Relation Age of Onset  . Cancer Father   . Heart disease Father   . Diabetes Paternal Grandmother     Social History   Socioeconomic History  . Marital status: Widowed    Spouse name: Not on file  . Number of children: Not on file  . Years of education: Not on file  . Highest education level: Not on file  Occupational History  . Not on file  Social Needs  . Financial resource strain: Not on file  . Food insecurity:    Worry: Not on file    Inability: Not on file  .  Transportation needs:    Medical: Not on file    Non-medical: Not on file  Tobacco Use  . Smoking status: Former Research scientist (life sciences)  . Smokeless tobacco: Never Used  Substance and Sexual Activity  . Alcohol use: No    Alcohol/week: 0.0 oz  . Drug use: No  . Sexual activity: Never  Lifestyle  . Physical activity:    Days per week: Not on file    Minutes per session: Not on file  . Stress: Not on file  Relationships  . Social connections:    Talks on phone: Not on file    Gets together: Not on file    Attends religious service: Not on file    Active member of club or organization: Not on file    Attends meetings of clubs or organizations: Not on file    Relationship status: Not on file  . Intimate partner violence:    Fear of current or ex partner: Not on file    Emotionally abused: Not on file    Physically abused: Not on file    Forced sexual activity: Not on file  Other Topics Concern  . Not on file  Social History Narrative  . Not on file    No Known Allergies  Outpatient Medications Prior to Visit  Medication Sig Dispense Refill  . amLODipine (NORVASC) 5 MG tablet TAKE 1 TABLET(5 MG) BY MOUTH DAILY 90 tablet 0  . Cholecalciferol (VITAMIN D) 2000 units tablet Take by mouth.    Marland Kitchen  clopidogrel (PLAVIX) 75 MG tablet TAKE 1 TABLET BY MOUTH EVERY DAY AT 6:00AM 90 tablet 1  . diclofenac sodium (VOLTAREN) 1 % GEL Apply 4 g topically 4 (four) times daily. (Patient taking differently: Apply 4 g topically 4 (four) times daily. Dr Marry Guan) 1 Tube 1  . etodolac (LODINE) 500 MG tablet TAKE 1 TABLET BY MOUTH TWICE DAILY 60 tablet 0  . fenofibrate 160 MG tablet Take 1 tablet (160 mg total) by mouth daily. 90 tablet 1  . fenofibrate 160 MG tablet TAKE 1 TABLET BY MOUTH EVERY DAY 90 tablet 0  . guaiFENesin-codeine (ROBITUSSIN AC) 100-10 MG/5ML syrup Take 5 mLs by mouth 3 (three) times daily as needed for cough. 150 mL 0  . metoprolol succinate (TOPROL-XL) 100 MG 24 hr tablet TAKE 1 TABLET BY MOUTH  EVERY DAY IMMEDIATELY FOLLOWING A MEAL 90 tablet 1  . niacin 500 MG CR capsule Take 1 capsule (500 mg total) by mouth daily at 6 (six) AM. 90 capsule 1  . ramipril (ALTACE) 10 MG capsule Take 1 capsule (10 mg total) by mouth daily. 90 capsule 1  . simvastatin (ZOCOR) 40 MG tablet TAKE 1 TABLET BY MOUTH EVERY DAY AT 6AM 90 tablet 1   No facility-administered medications prior to visit.     Review of Systems  Constitutional: Positive for appetite change and weight loss. Negative for chills, fever and malaise/fatigue.  HENT: Negative for ear discharge, ear pain, hoarse voice, postnasal drip, rhinorrhea, sneezing and sore throat.   Eyes: Negative for blurred vision.  Respiratory: Positive for cough. Negative for hemoptysis, sputum production, shortness of breath and wheezing.   Cardiovascular: Negative for chest pain, dyspnea on exertion, palpitations, leg swelling and PND.  Gastrointestinal: Negative for abdominal pain, blood in stool, constipation, diarrhea, heartburn, melena and nausea.  Genitourinary: Negative for dysuria, frequency, hematuria and urgency.  Musculoskeletal: Negative for back pain, joint pain, myalgias and neck pain.  Skin: Negative for rash.  Neurological: Negative for dizziness, tingling, sensory change, focal weakness and headaches.  Endo/Heme/Allergies: Negative for environmental allergies and polydipsia. Does not bruise/bleed easily.  Psychiatric/Behavioral: Negative for depression and suicidal ideas. The patient is not nervous/anxious and does not have insomnia.      Objective  Vitals:   06/29/17 0844  BP: 120/80  Pulse: 78  SpO2: 98%  Weight: (!) 311 lb (141.1 kg)  Height: 6\' 4"  (1.93 m)    Physical Exam  Constitutional: He is oriented to person, place, and time.  HENT:  Head: Normocephalic.  Right Ear: External ear normal.  Left Ear: External ear normal.  Nose: Nose normal.  Mouth/Throat: Oropharynx is clear and moist.  Eyes: Pupils are equal, round,  and reactive to light. Conjunctivae and EOM are normal. Right eye exhibits no discharge. Left eye exhibits no discharge. No scleral icterus.  Neck: Normal range of motion. Neck supple. No JVD present. No tracheal deviation present. No thyromegaly present.  Cardiovascular: Normal rate, regular rhythm, normal heart sounds and intact distal pulses. Exam reveals no gallop and no friction rub.  No murmur heard. Pulmonary/Chest: Breath sounds normal. No respiratory distress. He has no wheezes. He has no rales.  Abdominal: Soft. Bowel sounds are normal. He exhibits no mass. There is no hepatosplenomegaly. There is no tenderness. There is no rebound, no guarding and no CVA tenderness.  Musculoskeletal: Normal range of motion. He exhibits no edema or tenderness.  Lymphadenopathy:    He has no cervical adenopathy.  Neurological: He is alert and oriented to  person, place, and time. He has normal strength and normal reflexes. No cranial nerve deficit.  Skin: Skin is warm. No rash noted.  Nursing note and vitals reviewed.     Assessment & Plan  Problem List Items Addressed This Visit    None    Visit Diagnoses    Pneumathemia, sequela    -  Primary   Pulmonary nodule       to be evaluated ct   Colon cancer screening       Relevant Orders   Ambulatory referral to Gastroenterology    Discussed with pt the process of GI referrals. He will be waiting for a call from GI office, as well as he will go ahead and call his ins. Company and ask [facility] coverage. Pt will call us if hasn't heard from GI in 2 weeks.  No orders of the defined types were placed in this encounter.     Dr. [facility]  06/29/17

## 2017-07-04 ENCOUNTER — Other Ambulatory Visit: Payer: Self-pay

## 2017-07-04 DIAGNOSIS — R918 Other nonspecific abnormal finding of lung field: Secondary | ICD-10-CM

## 2017-07-05 ENCOUNTER — Other Ambulatory Visit: Payer: Self-pay

## 2017-07-05 ENCOUNTER — Ambulatory Visit: Payer: Medicare Other

## 2017-07-05 ENCOUNTER — Ambulatory Visit: Admission: RE | Admit: 2017-07-05 | Payer: Medicare Other | Source: Ambulatory Visit

## 2017-07-07 ENCOUNTER — Ambulatory Visit
Admission: RE | Admit: 2017-07-07 | Discharge: 2017-07-07 | Disposition: A | Discharge status: Home / Self Care | Payer: Medicare Other | Source: Ambulatory Visit | Attending: Family Medicine | Admitting: Family Medicine

## 2017-07-07 DIAGNOSIS — N281 Cyst of kidney, acquired: Secondary | ICD-10-CM | POA: Diagnosis not present

## 2017-07-07 DIAGNOSIS — R918 Other nonspecific abnormal finding of lung field: Secondary | ICD-10-CM | POA: Diagnosis not present

## 2017-07-07 DIAGNOSIS — I2510 Atherosclerotic heart disease of native coronary artery without angina pectoris: Secondary | ICD-10-CM | POA: Diagnosis not present

## 2017-07-07 DIAGNOSIS — R911 Solitary pulmonary nodule: Secondary | ICD-10-CM | POA: Diagnosis not present

## 2017-07-07 DIAGNOSIS — N200 Calculus of kidney: Secondary | ICD-10-CM | POA: Diagnosis not present

## 2017-07-07 DIAGNOSIS — I700 Atherosclerosis of aorta: Secondary | ICD-10-CM | POA: Insufficient documentation

## 2017-07-07 DIAGNOSIS — I517 Cardiomegaly: Secondary | ICD-10-CM | POA: Insufficient documentation

## 2017-07-11 ENCOUNTER — Other Ambulatory Visit: Payer: Self-pay | Admitting: Family Medicine

## 2017-07-11 DIAGNOSIS — I10 Essential (primary) hypertension: Secondary | ICD-10-CM

## 2017-08-04 ENCOUNTER — Encounter: Payer: Self-pay | Admitting: *Deleted

## 2017-08-04 ENCOUNTER — Ambulatory Visit (INDEPENDENT_AMBULATORY_CARE_PROVIDER_SITE_OTHER): Payer: Medicare Other | Admitting: Family Medicine

## 2017-08-04 ENCOUNTER — Encounter: Payer: Self-pay | Admitting: Family Medicine

## 2017-08-04 VITALS — BP 130/76 | HR 112 | Temp 98.0°F | Ht 76.0 in | Wt 327.0 lb

## 2017-08-04 DIAGNOSIS — I2510 Atherosclerotic heart disease of native coronary artery without angina pectoris: Secondary | ICD-10-CM

## 2017-08-04 DIAGNOSIS — I639 Cerebral infarction, unspecified: Secondary | ICD-10-CM | POA: Diagnosis not present

## 2017-08-04 DIAGNOSIS — I679 Cerebrovascular disease, unspecified: Secondary | ICD-10-CM | POA: Diagnosis not present

## 2017-08-04 DIAGNOSIS — I509 Heart failure, unspecified: Secondary | ICD-10-CM

## 2017-08-04 DIAGNOSIS — M1711 Unilateral primary osteoarthritis, right knee: Secondary | ICD-10-CM | POA: Diagnosis not present

## 2017-08-04 DIAGNOSIS — G459 Transient cerebral ischemic attack, unspecified: Secondary | ICD-10-CM | POA: Diagnosis not present

## 2017-08-04 DIAGNOSIS — I739 Peripheral vascular disease, unspecified: Secondary | ICD-10-CM | POA: Diagnosis not present

## 2017-08-04 DIAGNOSIS — G4719 Other hypersomnia: Secondary | ICD-10-CM | POA: Diagnosis not present

## 2017-08-04 DIAGNOSIS — I10 Essential (primary) hypertension: Secondary | ICD-10-CM

## 2017-08-04 DIAGNOSIS — R0989 Other specified symptoms and signs involving the circulatory and respiratory systems: Secondary | ICD-10-CM | POA: Diagnosis not present

## 2017-08-04 DIAGNOSIS — I208 Other forms of angina pectoris: Secondary | ICD-10-CM | POA: Diagnosis not present

## 2017-08-04 DIAGNOSIS — R0602 Shortness of breath: Secondary | ICD-10-CM | POA: Diagnosis not present

## 2017-08-04 DIAGNOSIS — E7849 Other hyperlipidemia: Secondary | ICD-10-CM | POA: Diagnosis not present

## 2017-08-04 DIAGNOSIS — I5022 Chronic systolic (congestive) heart failure: Secondary | ICD-10-CM | POA: Diagnosis not present

## 2017-08-04 DIAGNOSIS — R600 Localized edema: Secondary | ICD-10-CM | POA: Diagnosis not present

## 2017-08-04 DIAGNOSIS — I25118 Atherosclerotic heart disease of native coronary artery with other forms of angina pectoris: Secondary | ICD-10-CM | POA: Diagnosis not present

## 2017-08-04 NOTE — Progress Notes (Signed)
Name: Christopher [name]   MRN: [id number]    DOB: 1945-12-25   Date:08/04/2017       Progress Note  Subjective  Chief Complaint  Chief Complaint  Patient presents with  . Shortness of Breath    had pneumonia in April- can't walk approx 75- 100 ft without giving out of breath. Sits for about a minute and he feels fine. Waking up at 3:00 every morning, stays awake for 30 minutes and can go back to sleep    Shortness of Breath  This is a new problem. The current episode started more than 1 year ago. The problem occurs constantly. The problem has been gradually worsening. Associated symptoms include PND. Pertinent negatives include no abdominal pain, chest pain, claudication, coryza, ear pain, fever, headaches, hemoptysis, leg pain, leg swelling, neck pain, orthopnea, rash, rhinorrhea, sore throat, sputum production, swollen glands, syncope, vomiting or wheezing. He has tried beta agonist inhalers for the symptoms. His past medical history is significant for CAD and pneumonia. There is no history of asthma, bronchiolitis, DVT, a heart failure or PE. (MI 2000)  Congestive Heart Failure  Presents for follow-up visit. Associated symptoms include edema, fatigue, paroxysmal nocturnal dyspnea and shortness of breath. Pertinent negatives include no abdominal pain, chest pain, chest pressure, claudication, muscle weakness, near-syncope, nocturia, orthopnea or palpitations. (Edema /DOE) The symptoms have been worsening. His past medical history is significant for CAD. There is no history of DVT or PE. (MI 2000)  Hypertension  This is a chronic problem. The current episode started more than 1 year ago. The problem is unchanged. The problem is controlled. Associated symptoms include peripheral edema, PND and shortness of breath. Pertinent negatives include no blurred vision, chest pain, headaches, malaise/fatigue, neck pain, orthopnea or palpitations. Risk factors for coronary artery disease include dyslipidemia,  obesity and male gender. Past treatments include beta blockers, calcium channel blockers and ACE inhibitors. The current treatment provides moderate improvement. There are no compliance problems.  Hypertensive end-organ damage includes CAD/MI and CVA. There is no history of angina, kidney disease, heart failure, left ventricular hypertrophy, PVD or retinopathy. There is no history of chronic renal disease, a hypertension causing med or renovascular disease.    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 Hyperlipidemia   . Hypertension   . Myocardial infarction [facility])     Past Surgical History:  Procedure Laterality Date  . COLONOSCOPY  2011  . KNEE ARTHROSCOPY Right 2016    Family History  Problem Relation Age of Onset  . Cancer Father   . Heart disease Father   . Diabetes Paternal Grandmother     Social History   Socioeconomic History  . Marital status: Widowed    Spouse name: Not on file  . Number of children: Not on file  . Years of education: Not on file  . Highest education level: Not on file  Occupational History  . Not on file  Social Needs  . Financial resource strain: Not on file  . Food insecurity:    Worry: Not on file    Inability: Not on file  . Transportation needs:    Medical: Not on file    Non-medical: Not on file  Tobacco Use  . Smoking status: Former Research scientist (life sciences)  . Smokeless tobacco: Never Used  Substance and Sexual Activity  . Alcohol use: No    Alcohol/week: 0.0 oz  .  Drug use: No  . Sexual activity: Never  Lifestyle  . Physical activity:    Days per week: Not on file    Minutes per session: Not on file  . Stress: Not on file  Relationships  . Social connections:    Talks on phone: Not on file    Gets together: Not on file    Attends religious service: Not on file    Active member of club or  organization: Not on file    Attends meetings of clubs or organizations: Not on file    Relationship status: Not on file  . Intimate partner violence:    Fear of current or ex partner: Not on file    Emotionally abused: Not on file    Physically abused: Not on file    Forced sexual activity: Not on file  Other Topics Concern  . Not on file  Social History Narrative  . Not on file    No Known Allergies  Outpatient Medications Prior to Visit  Medication Sig Dispense Refill  . amLODipine (NORVASC) 5 MG tablet TAKE 1 TABLET(5 MG) BY MOUTH DAILY 90 tablet 0  . Cholecalciferol (VITAMIN D) 2000 units tablet Take by mouth.    . clopidogrel (PLAVIX) 75 MG tablet TAKE 1 TABLET BY MOUTH EVERY DAY AT 6:00AM 90 tablet 1  . diclofenac sodium (VOLTAREN) 1 % GEL Apply 4 g topically 4 (four) times daily. (Patient taking differently: Apply 4 g topically 4 (four) times daily. Dr Marry Guan) 1 Tube 1  . etodolac (LODINE) 500 MG tablet TAKE 1 TABLET BY MOUTH TWICE DAILY 60 tablet 0  . fenofibrate 160 MG tablet Take 1 tablet (160 mg total) by mouth daily. 90 tablet 1  . metoprolol succinate (TOPROL-XL) 100 MG 24 hr tablet TAKE 1 TABLET BY MOUTH EVERY DAY IMMEDIATELY FOLLOWING A MEAL 90 tablet 0  . niacin 500 MG CR capsule Take 1 capsule (500 mg total) by mouth daily at 6 (six) AM. 90 capsule 1  . ramipril (ALTACE) 10 MG capsule Take 1 capsule (10 mg total) by mouth daily. 90 capsule 1  . simvastatin (ZOCOR) 40 MG tablet TAKE 1 TABLET BY MOUTH EVERY DAY AT 6AM 90 tablet 1  . fenofibrate 160 MG tablet TAKE 1 TABLET BY MOUTH EVERY DAY 90 tablet 0  . guaiFENesin-codeine (ROBITUSSIN AC) 100-10 MG/5ML syrup Take 5 mLs by mouth 3 (three) times daily as needed for cough. 150 mL 0   No facility-administered medications prior to visit.     Review of Systems  Constitutional: Positive for fatigue. Negative for chills, fever, malaise/fatigue and weight loss.  HENT: Negative for ear discharge, ear pain, rhinorrhea and  sore throat.   Eyes: Negative for blurred vision.  Respiratory: Positive for shortness of breath. Negative for cough, hemoptysis, sputum production and wheezing.   Cardiovascular: Positive for PND. Negative for chest pain, palpitations, orthopnea, claudication, leg swelling, syncope and near-syncope.  Gastrointestinal: Negative for abdominal pain, blood in stool, constipation, diarrhea, heartburn, melena, nausea and vomiting.  Genitourinary: Negative for dysuria, frequency, hematuria, nocturia and urgency.  Musculoskeletal: Negative for back pain, joint pain, myalgias, muscle weakness and neck pain.  Skin: Negative for rash.  Neurological: Negative for dizziness, tingling, sensory change, focal weakness and headaches.  Endo/Heme/Allergies: Negative for environmental allergies and polydipsia. Does not bruise/bleed easily.  Psychiatric/Behavioral: Negative for depression and suicidal ideas. The patient is not nervous/anxious and does not have insomnia.      Objective  Vitals:   08/04/17  1031 08/04/17 1041  BP: 130/76   Pulse: (!) 112   Temp: 98 F (36.7 C)   TempSrc: Oral   SpO2: 94% 99%  Weight: (!) 327 lb (148.3 kg)   Height: 6\' 4"  (1.93 m)     Physical Exam  Constitutional: He is oriented to person, place, and time. He appears well-developed and well-nourished.  HENT:  Head: Normocephalic.  Right Ear: External ear normal.  Left Ear: External ear normal.  Nose: Nose normal.  Mouth/Throat: Oropharynx is clear and moist.  Eyes: Pupils are equal, round, and reactive to light. Conjunctivae and EOM are normal. Right eye exhibits no discharge. Left eye exhibits no discharge. No scleral icterus.  Neck: Normal range of motion. Neck supple. No JVD present. No tracheal deviation present. No thyromegaly present.  Cardiovascular: Normal rate, regular rhythm, S1 normal, S2 normal and intact distal pulses. Exam reveals gallop and S4. Exam reveals no S3 and no friction rub.  No murmur  heard. Pulmonary/Chest: No respiratory distress. He has decreased breath sounds in the left lower field. He has no wheezes. He has no rales.  Abdominal: Soft. Bowel sounds are normal. He exhibits no mass. There is no hepatosplenomegaly. There is no tenderness. There is no rebound, no guarding and no CVA tenderness.  Musculoskeletal: Normal range of motion. He exhibits edema. He exhibits no tenderness.  Lymphadenopathy:    He has no cervical adenopathy.  Neurological: He is alert and oriented to person, place, and time. He has normal strength and normal reflexes. No cranial nerve deficit.  Skin: Skin is warm. No rash noted.  Nursing note and vitals reviewed.     Assessment & Plan  Problem List Items Addressed This Visit      Cardiovascular and Mediastinum   Essential (primary) hypertension    Stable on amlodipine,metoprolol, and ramipril        Other   Familial multiple lipoprotein-type hyperlipidemia    Stable on statin       Other Visit Diagnoses    Acute congestive heart failure, unspecified heart failure type ([place])    -  Primary   cardiomegaly with desaturated DOE, PND and S4 gallop.   Relevant Orders   Ambulatory referral to Cardiology   Coronary artery disease involving native heart without angina pectoris, unspecified vessel or lesion type       PCI with stent,CEA with stent followed Dr Clayborn Bigness   Cerebral vascular disease       stable on plavix/      No orders of the defined types were placed in this encounter.     Dr. [facility]  08/04/17

## 2017-08-04 NOTE — Assessment & Plan Note (Signed)
Stable on statin.

## 2017-08-04 NOTE — Assessment & Plan Note (Signed)
Stable on amlodipine,metoprolol, and ramipril

## 2017-08-09 ENCOUNTER — Other Ambulatory Visit: Payer: Self-pay | Admitting: Family Medicine

## 2017-08-09 DIAGNOSIS — E7849 Other hyperlipidemia: Secondary | ICD-10-CM

## 2017-08-09 DIAGNOSIS — I10 Essential (primary) hypertension: Secondary | ICD-10-CM

## 2017-08-10 ENCOUNTER — Other Ambulatory Visit: Payer: Self-pay | Admitting: Internal Medicine

## 2017-08-10 DIAGNOSIS — I25118 Atherosclerotic heart disease of native coronary artery with other forms of angina pectoris: Secondary | ICD-10-CM

## 2017-08-10 DIAGNOSIS — I5022 Chronic systolic (congestive) heart failure: Secondary | ICD-10-CM

## 2017-08-10 DIAGNOSIS — R0602 Shortness of breath: Secondary | ICD-10-CM

## 2017-08-17 ENCOUNTER — Ambulatory Visit: Admission: RE | Admit: 2017-08-17 | Payer: Medicare Other | Source: Ambulatory Visit

## 2017-08-18 ENCOUNTER — Other Ambulatory Visit: Payer: Self-pay | Admitting: Internal Medicine

## 2017-08-18 DIAGNOSIS — R0602 Shortness of breath: Secondary | ICD-10-CM

## 2017-08-18 DIAGNOSIS — I5022 Chronic systolic (congestive) heart failure: Secondary | ICD-10-CM

## 2017-08-18 DIAGNOSIS — I25118 Atherosclerotic heart disease of native coronary artery with other forms of angina pectoris: Secondary | ICD-10-CM

## 2017-09-08 DIAGNOSIS — G4719 Other hypersomnia: Secondary | ICD-10-CM | POA: Diagnosis not present

## 2017-09-08 DIAGNOSIS — I739 Peripheral vascular disease, unspecified: Secondary | ICD-10-CM | POA: Diagnosis not present

## 2017-09-08 DIAGNOSIS — I5022 Chronic systolic (congestive) heart failure: Secondary | ICD-10-CM | POA: Diagnosis not present

## 2017-09-08 DIAGNOSIS — R600 Localized edema: Secondary | ICD-10-CM | POA: Diagnosis not present

## 2017-09-08 DIAGNOSIS — M1711 Unilateral primary osteoarthritis, right knee: Secondary | ICD-10-CM | POA: Diagnosis not present

## 2017-09-08 DIAGNOSIS — I509 Heart failure, unspecified: Secondary | ICD-10-CM | POA: Diagnosis not present

## 2017-09-08 DIAGNOSIS — G459 Transient cerebral ischemic attack, unspecified: Secondary | ICD-10-CM | POA: Diagnosis not present

## 2017-09-08 DIAGNOSIS — R0989 Other specified symptoms and signs involving the circulatory and respiratory systems: Secondary | ICD-10-CM | POA: Diagnosis not present

## 2017-09-08 DIAGNOSIS — I25118 Atherosclerotic heart disease of native coronary artery with other forms of angina pectoris: Secondary | ICD-10-CM | POA: Diagnosis not present

## 2017-09-08 DIAGNOSIS — R0602 Shortness of breath: Secondary | ICD-10-CM | POA: Diagnosis not present

## 2017-09-08 DIAGNOSIS — I639 Cerebral infarction, unspecified: Secondary | ICD-10-CM | POA: Diagnosis not present

## 2017-09-22 ENCOUNTER — Encounter
Admission: RE | Admit: 2017-09-22 | Discharge: 2017-09-22 | Disposition: A | Discharge status: Home / Self Care | Payer: Medicare Other | Source: Ambulatory Visit | Attending: Internal Medicine | Admitting: Internal Medicine

## 2017-09-22 DIAGNOSIS — R9439 Abnormal result of other cardiovascular function study: Secondary | ICD-10-CM | POA: Diagnosis not present

## 2017-09-22 DIAGNOSIS — R0602 Shortness of breath: Secondary | ICD-10-CM | POA: Insufficient documentation

## 2017-09-22 DIAGNOSIS — I5022 Chronic systolic (congestive) heart failure: Secondary | ICD-10-CM | POA: Insufficient documentation

## 2017-09-22 DIAGNOSIS — I25118 Atherosclerotic heart disease of native coronary artery with other forms of angina pectoris: Secondary | ICD-10-CM

## 2017-09-22 MED ORDER — REGADENOSON 0.4 MG/5ML IV SOLN
0.4000 mg | Freq: Once | INTRAVENOUS | Status: AC
  Administered 2017-09-22: 0.4 mg via INTRAVENOUS

## 2017-09-22 MED ORDER — TECHNETIUM TC 99M TETROFOSMIN IV KIT
30.0300 | PACK | Freq: Once | INTRAVENOUS | Status: AC | PRN
  Administered 2017-09-22: 30.03 via INTRAVENOUS

## 2017-09-23 ENCOUNTER — Encounter
Admission: RE | Admit: 2017-09-23 | Discharge: 2017-09-23 | Disposition: A | Discharge status: Home / Self Care | Payer: Medicare Other | Source: Ambulatory Visit | Attending: Internal Medicine | Admitting: Internal Medicine

## 2017-09-23 MED ORDER — TECHNETIUM TC 99M TETROFOSMIN IV KIT
32.1100 | PACK | Freq: Once | INTRAVENOUS | Status: AC | PRN
  Administered 2017-09-23: 32.11 via INTRAVENOUS

## 2017-09-28 ENCOUNTER — Other Ambulatory Visit: Payer: Self-pay | Admitting: Family Medicine

## 2017-09-28 DIAGNOSIS — I10 Essential (primary) hypertension: Secondary | ICD-10-CM

## 2017-10-04 LAB — NM MYOCAR MULTI W/SPECT W/WALL MOTION / EF
CHL CUP MPHR: 150 {beats}/min
CHL CUP NUCLEAR SDS: 2
CHL CUP NUCLEAR SRS: 23
CHL CUP RESTING HR STRESS: 93 {beats}/min
CSEPEDS: 0 s
CSEPEW: 1 METS
Exercise duration (min): 1 min
LV sys vol: 189 mL
LVDIAVOL: 289 mL (ref 62–150)
NUC STRESS TID: 1.01
Peak HR: 118 {beats}/min
Percent HR: 78 %
SSS: 22

## 2017-10-08 ENCOUNTER — Other Ambulatory Visit: Payer: Self-pay | Admitting: Family Medicine

## 2017-10-08 DIAGNOSIS — I10 Essential (primary) hypertension: Secondary | ICD-10-CM

## 2017-11-04 ENCOUNTER — Encounter: Payer: Self-pay | Admitting: Family Medicine

## 2017-11-04 ENCOUNTER — Ambulatory Visit (INDEPENDENT_AMBULATORY_CARE_PROVIDER_SITE_OTHER): Payer: Medicare Other | Admitting: Family Medicine

## 2017-11-04 VITALS — BP 122/60 | HR 64 | Temp 97.6°F | Ht 76.0 in | Wt 318.0 lb

## 2017-11-04 DIAGNOSIS — Z23 Encounter for immunization: Secondary | ICD-10-CM

## 2017-11-04 DIAGNOSIS — E860 Dehydration: Secondary | ICD-10-CM

## 2017-11-04 DIAGNOSIS — J0100 Acute maxillary sinusitis, unspecified: Secondary | ICD-10-CM | POA: Diagnosis not present

## 2017-11-04 DIAGNOSIS — I25118 Atherosclerotic heart disease of native coronary artery with other forms of angina pectoris: Secondary | ICD-10-CM

## 2017-11-04 DIAGNOSIS — J40 Bronchitis, not specified as acute or chronic: Secondary | ICD-10-CM | POA: Diagnosis not present

## 2017-11-04 MED ORDER — GUAIFENESIN-CODEINE 100-10 MG/5ML PO SYRP: 5.0000 mL | ORAL_SOLUTION | Freq: Three times a day (TID) | ORAL | 0 refills | Status: DC | PRN

## 2017-11-04 MED ORDER — AMOXICILLIN-POT CLAVULANATE 875-125 MG PO TABS: 1.0000 | ORAL_TABLET | Freq: Two times a day (BID) | ORAL | 0 refills | Status: DC

## 2017-11-04 NOTE — Progress Notes (Signed)
Name: Christopher [name]   MRN: [id number]    DOB: 1945/11/26   Date:11/04/2017       Progress Note  Subjective  Chief Complaint  Chief Complaint  Patient presents with  . Sinusitis    productive cough- offwhite, loose/ cong.    Sinusitis  This is a new problem. The current episode started in the past 7 days. The problem has been gradually worsening since onset. There has been no fever. He is experiencing no pain. Associated symptoms include congestion, coughing and sinus pressure. Pertinent negatives include no chills, diaphoresis, ear pain, headaches, hoarse voice, neck pain, shortness of breath, sneezing, sore throat or swollen glands. Past treatments include acetaminophen (mucinex). The treatment provided no relief.  Cough  Pertinent negatives include no chest pain, chills, ear pain, fever, headaches, heartburn, myalgias, rash, sore throat, shortness of breath, weight loss or wheezing. There is no history of environmental allergies.    No problem-specific Assessment & Plan notes found for this encounter.   Past Medical History:  Diagnosis Date  . Hyperlipidemia   . Hypertension   . Myocardial infarction [facility])     Past Surgical History:  Procedure Laterality Date  . COLONOSCOPY  2011  . KNEE ARTHROSCOPY Right 2016    Family History  Problem Relation Age of Onset  . Cancer Father   . Heart disease Father   . Diabetes Paternal Grandmother     Social History   Socioeconomic History  . Marital status: Widowed    Spouse name: Not on file  . Number of children: Not on file  . Years of education: Not on file  . Highest education level: Not on file  Occupational History  . Not on file  Social Needs  . Financial resource strain: Not on file  . Food insecurity:    Worry: Not on file    Inability: Not on file  . Transportation needs:    Medical: Not on file    Non-medical: Not on file  Tobacco Use  . Smoking status: Former Research scientist (life sciences)  . Smokeless tobacco: Never Used   Substance and Sexual Activity  . Alcohol use: No    Alcohol/week: 0.0 standard drinks  . Drug use: No  . Sexual activity: Never  Lifestyle  . Physical activity:    Days per week: Not on file    Minutes per session: Not on file  . Stress: Not on file  Relationships  . Social connections:    Talks on phone: Not on file    Gets together: Not on file    Attends religious service: Not on file    Active member of club or organization: Not on file    Attends meetings of clubs or organizations: Not on file    Relationship status: Not on file  . Intimate partner violence:    Fear of current or ex partner: Not on file    Emotionally abused: Not on file    Physically abused: Not on file    Forced sexual activity: Not on file  Other Topics Concern  . Not on file  Social History Narrative  . Not on file    No Known Allergies  Outpatient Medications Prior to Visit  Medication Sig Dispense Refill  . amLODipine (NORVASC) 5 MG tablet TAKE 1 TABLET(5 MG) BY MOUTH DAILY 90 tablet 0  . Cholecalciferol (VITAMIN D) 2000 units tablet Take by mouth.    . clopidogrel (PLAVIX) 75 MG tablet TAKE 1 TABLET BY MOUTH EVERY  DAY AT 6:00AM 90 tablet 1  . diclofenac sodium (VOLTAREN) 1 % GEL Apply 4 g topically 4 (four) times daily. (Patient taking differently: Apply 4 g topically 4 (four) times daily. Dr Marry Guan) 1 Tube 1  . etodolac (LODINE) 500 MG tablet TAKE 1 TABLET BY MOUTH TWICE DAILY 60 tablet 0  . fenofibrate 160 MG tablet Take 1 tablet (160 mg total) by mouth daily. 90 tablet 1  . metoprolol succinate (TOPROL-XL) 100 MG 24 hr tablet TAKE 1 TABLET BY MOUTH EVERY DAY IMMEDIATELY FOLLOWING A MEAL 90 tablet 0  . niacin 500 MG CR capsule Take 1 capsule (500 mg total) by mouth daily at 6 (six) AM. 90 capsule 1  . ramipril (ALTACE) 10 MG capsule TAKE 1 CAPSULE(10 MG) BY MOUTH DAILY 90 capsule 0  . simvastatin (ZOCOR) 40 MG tablet TAKE 1 TABLET BY MOUTH EVERY DAY AT 6 AM 90 tablet 0  . torsemide (DEMADEX)  10 MG tablet Take 1 tablet by mouth daily. Dr Clayborn Bigness    . fenofibrate 160 MG tablet TAKE 1 TABLET BY MOUTH EVERY DAY 90 tablet 0   No facility-administered medications prior to visit.     Review of Systems  Constitutional: Negative for chills, diaphoresis, fever, malaise/fatigue and weight loss.  HENT: Positive for congestion and sinus pressure. Negative for ear discharge, ear pain, hoarse voice, sneezing and sore throat.   Eyes: Negative for blurred vision.  Respiratory: Positive for cough. Negative for sputum production, shortness of breath and wheezing.   Cardiovascular: Negative for chest pain, palpitations and leg swelling.  Gastrointestinal: Negative for abdominal pain, blood in stool, constipation, diarrhea, heartburn, melena and nausea.  Genitourinary: Negative for dysuria, frequency, hematuria and urgency.  Musculoskeletal: Negative for back pain, joint pain, myalgias and neck pain.  Skin: Negative for rash.  Neurological: Negative for dizziness, tingling, sensory change, focal weakness and headaches.  Endo/Heme/Allergies: Negative for environmental allergies and polydipsia. Does not bruise/bleed easily.  Psychiatric/Behavioral: Negative for depression and suicidal ideas. The patient is not nervous/anxious and does not have insomnia.      Objective  Vitals:   11/04/17 0912  BP: 122/60  Pulse: 64  Temp: 97.6 F (36.4 C)  TempSrc: Oral  Weight: (!) 318 lb (144.2 kg)  Height: 6\' 4"  (1.93 m)    Physical Exam  Constitutional: He is oriented to person, place, and time.  HENT:  Head: Normocephalic.  Right Ear: External ear normal.  Left Ear: External ear normal.  Nose: Nose normal.  Mouth/Throat: Oropharynx is clear and moist.  Eyes: Pupils are equal, round, and reactive to light. Conjunctivae and EOM are normal. Right eye exhibits no discharge. Left eye exhibits no discharge. No scleral icterus.  Neck: Normal range of motion. Neck supple. No JVD present. No tracheal  deviation present. No thyromegaly present.  Cardiovascular: Normal rate, regular rhythm, normal heart sounds and intact distal pulses. Exam reveals no gallop and no friction rub.  No murmur heard. Pulmonary/Chest: Breath sounds normal. No respiratory distress. He has no wheezes. He has no rales.  Abdominal: Soft. Bowel sounds are normal. He exhibits no mass. There is no hepatosplenomegaly. There is no tenderness. There is no rebound, no guarding and no CVA tenderness.  Musculoskeletal: Normal range of motion. He exhibits no edema or tenderness.  Lymphadenopathy:    He has no cervical adenopathy.  Neurological: He is alert and oriented to person, place, and time. He has normal strength and normal reflexes. No cranial nerve deficit.  Skin: Skin is  warm. No rash noted.  Nursing note and vitals reviewed.     Assessment & Plan  Problem List Items Addressed This Visit    None    Visit Diagnoses    Acute maxillary sinusitis, recurrence not specified    -  Primary   Acute, Hx/exam c/w infection. Augmentin  875 mg bid for 10 days.    Relevant Medications   amoxicillin-clavulanate (AUGMENTIN) 875-125 MG tablet   guaiFENesin-codeine (ROBITUSSIN AC) 100-10 MG/5ML syrup   Bronchitis       D/c Mucinex. Prescribe Robitussin AC q 6 hr    Relevant Medications   guaiFENesin-codeine (ROBITUSSIN AC) 100-10 MG/5ML syrup   Dehydration       Encourage fluids    Flu vaccine need       Administered   Relevant Orders   Flu Vaccine QUAD 6+ mos PF IM (Fluarix Quad PF) (Completed)      Meds ordered this encounter  Medications  . amoxicillin-clavulanate (AUGMENTIN) 875-125 MG tablet    Sig: Take 1 tablet by mouth 2 (two) times daily.    Dispense:  20 tablet    Refill:  0  . guaiFENesin-codeine (ROBITUSSIN AC) 100-10 MG/5ML syrup    Sig: Take 5 mLs by mouth 3 (three) times daily as needed for cough.    Dispense:  150 mL    Refill:  0      Dr. Otilio [facility]  11/04/17

## 2017-11-08 ENCOUNTER — Other Ambulatory Visit: Payer: Self-pay | Admitting: Family Medicine

## 2017-11-08 DIAGNOSIS — E7849 Other hyperlipidemia: Secondary | ICD-10-CM

## 2017-11-26 ENCOUNTER — Other Ambulatory Visit: Payer: Self-pay | Admitting: Family Medicine

## 2017-12-12 ENCOUNTER — Encounter: Payer: Medicare Other | Attending: Internal Medicine | Admitting: *Deleted

## 2017-12-12 ENCOUNTER — Encounter: Payer: Self-pay | Admitting: *Deleted

## 2017-12-12 VITALS — Ht 74.5 in | Wt 322.7 lb

## 2017-12-12 DIAGNOSIS — I5022 Chronic systolic (congestive) heart failure: Secondary | ICD-10-CM | POA: Diagnosis not present

## 2017-12-12 DIAGNOSIS — G459 Transient cerebral ischemic attack, unspecified: Secondary | ICD-10-CM | POA: Insufficient documentation

## 2017-12-12 NOTE — Progress Notes (Signed)
Cardiac Individual Treatment Plan  Patient Details  Name: Christopher [name] MRN: [id number] Date of Birth: 01/14/1946 Referring Provider:     Cardiac Rehab from 12/12/2017 in [facility] Cardiac and Pulmonary Rehab  Referring Provider  Lujean Amel MD      Initial Encounter Date:    Cardiac Rehab from 12/12/2017 in [facility] Cardiac and Pulmonary Rehab  Date  12/12/17      Visit Diagnosis: Heart failure, chronic systolic ([place])  Patient's Home Medications on Admission:  Current Outpatient Medications:  .  amLODipine (NORVASC) 5 MG tablet, TAKE 1 TABLET(5 MG) BY MOUTH DAILY, Disp: 90 tablet, Rfl: 0 .  amoxicillin-clavulanate (AUGMENTIN) 875-125 MG tablet, Take 1 tablet by mouth 2 (two) times daily., Disp: 20 tablet, Rfl: 0 .  Cholecalciferol (VITAMIN D) 2000 units tablet, Take by mouth., Disp: , Rfl:  .  clopidogrel (PLAVIX) 75 MG tablet, TAKE 1 TABLET BY MOUTH EVERY DAY AT 6:00AM, Disp: 90 tablet, Rfl: 1 .  diclofenac sodium (VOLTAREN) 1 % GEL, Apply 4 g topically 4 (four) times daily. (Patient taking differently: Apply 4 g topically 4 (four) times daily. Dr Marry Guan), Disp: 1 Tube, Rfl: 1 .  etodolac (LODINE) 500 MG tablet, TAKE 1 TABLET BY MOUTH TWICE DAILY, Disp: 60 tablet, Rfl: 0 .  fenofibrate 160 MG tablet, Take 1 tablet (160 mg total) by mouth daily., Disp: 90 tablet, Rfl: 1 .  guaiFENesin-codeine (ROBITUSSIN AC) 100-10 MG/5ML syrup, Take 5 mLs by mouth 3 (three) times daily as needed for cough., Disp: 150 mL, Rfl: 0 .  metoprolol succinate (TOPROL-XL) 100 MG 24 hr tablet, TAKE 1 TABLET BY MOUTH EVERY DAY IMMEDIATELY FOLLOWING A MEAL, Disp: 90 tablet, Rfl: 0 .  niacin 500 MG CR capsule, Take 1 capsule (500 mg total) by mouth daily at 6 (six) AM., Disp: 90 capsule, Rfl: 1 .  ramipril (ALTACE) 10 MG capsule, TAKE 1 CAPSULE(10 MG) BY MOUTH DAILY, Disp: 90 capsule, Rfl: 0 .  simvastatin (ZOCOR) 40 MG tablet, TAKE 1 TABLET BY MOUTH EVERY DAY AT 6 AM, Disp: 90 tablet, Rfl: 0 .  torsemide  (DEMADEX) 10 MG tablet, Take 1 tablet by mouth daily. Dr Clayborn Bigness, Disp: , Rfl:  .  clopidogrel (PLAVIX) 75 MG tablet, TAKE 1 TABLET BY MOUTH EVERY DAY AT 6 AM, Disp: 90 tablet, Rfl: 0 .  ramipril (ALTACE) 10 MG capsule, TAKE ONE CAPSULE BY MOUTH DAILY, Disp: 90 capsule, Rfl: 0  Past Medical History: Past Medical History:  Diagnosis Date  . Hyperlipidemia   . Hypertension   . 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   Exercise Prescription Goal: Initial exercise prescription builds to 30-45 minutes a day of aerobic activity, 2-3 days per week.  Home exercise guidelines will be given to patient during program as part of exercise prescription that the participant  will acknowledge.  Activity Barriers & Risk Stratification: Activity Barriers & Cardiac Risk Stratification - 12/12/17 1236      Activity Barriers & Cardiac Risk Stratification   Activity Barriers  Arthritis;Right Knee Replacement;Joint Problems;Shortness of Breath;Decreased Ventricular Function;Muscular Weakness;Deconditioning;Balance Concerns   needs left knee replacement   Cardiac Risk Stratification  High       6 Minute Walk: 6 Minute Walk    Row Name 12/12/17 1235         6 Minute Walk   Phase  Initial     Distance  694 feet     Walk Time  4.37 minutes     # of Rest Breaks  3 rest for knee pain: 27 sec, 38 sec, 33 sec     MPH   1.8     METS  1.94     RPE  17     Perceived Dyspnea   1     VO2 Peak  6.81     Symptoms  Yes (comment)     Comments  L knee pain 8/10 (made gait a little wobbly), SOB     Resting HR  99 bpm     Resting BP  132/64     Resting Oxygen Saturation   99 %     Exercise Oxygen Saturation  during 6 min walk  97 %     Max Ex. HR  156 bpm     Max Ex. BP  166/72     2 Minute Post BP  144/66        Oxygen Initial Assessment:   Oxygen Re-Evaluation:   Oxygen Discharge (Final Oxygen Re-Evaluation):   Initial Exercise Prescription: Initial Exercise Prescription - 12/12/17 1200      Date of Initial Exercise RX and Referring Provider   Date  12/12/17    Referring Provider  Lujean Amel MD      Treadmill   MPH  1.2    Grade  0.5    Minutes  15    METs  2      NuStep   Level  1    SPM  80    Minutes  15    METs  2      REL-XR   Level  1    Speed  50    Minutes  15    METs  2      Prescription Details   Frequency (times per week)  3    Duration  Progress to 45 minutes of aerobic exercise without signs/symptoms of physical distress      Intensity   THRR 40-80% of Max Heartrate  119-140    Ratings of Perceived Exertion  11-13    Perceived Dyspnea  0-4      Progression   Progression  Continue to progress workloads to maintain intensity without signs/symptoms of physical distress.      Resistance Training   Training Prescription  Yes    Weight  3 lbs    Reps  10-15       Perform Capillary Blood Glucose checks as needed.  Exercise Prescription Changes: Exercise Prescription Changes    Row Name 12/12/17 1200             Response to Exercise   Blood Pressure (Admit)  132/64       Blood Pressure (Exercise)  166/72       Blood Pressure (Exit)  144/66       Heart Rate (Admit)  99 bpm       Heart Rate (Exercise)  156 bpm       Heart Rate (Exit)  98 bpm       Oxygen Saturation (Admit)  99 %       Oxygen Saturation (Exercise)  97 %       Rating of Perceived  Exertion (Exercise)  17       Perceived Dyspnea (Exercise)  1       Symptoms  knee pain 8/10, SOB       Comments  walk test results          Exercise Comments:   Exercise Goals and Review: Exercise Goals    Row Name 12/12/17 1240             Exercise Goals   Increase Physical Activity  Yes       Intervention  Provide advice, education, support and counseling about physical activity/exercise needs.;Develop an individualized exercise prescription for aerobic and resistive training based on initial evaluation findings, risk stratification, comorbidities and participant's personal goals.       Expected Outcomes  Short Term: Attend rehab on a regular basis to increase amount of physical activity.;Long Term: Add in home exercise to make exercise part of routine and to increase amount of physical activity.;Long Term: Exercising regularly at least 3-5 days a week.       Increase Strength and Stamina  Yes       Intervention  Provide advice, education, support and counseling about physical activity/exercise needs.;Develop an individualized exercise prescription for aerobic and resistive training based on initial evaluation findings, risk stratification, comorbidities and participant's personal goals.       Expected Outcomes  Short Term: Increase workloads from initial exercise prescription for resistance, speed, and METs.;Long Term: Improve cardiorespiratory fitness, muscular endurance and strength as measured by increased METs and functional capacity (6MWT);Short Term: Perform resistance training exercises routinely during rehab and add in resistance training at home       Able to understand and use rate of perceived exertion (RPE) scale  Yes       Intervention  Provide education and explanation on how to use RPE scale       Expected Outcomes  Short Term: Able to use RPE daily in rehab to express subjective intensity level;Long Term:  Able to use RPE to guide intensity level when exercising  independently       Able to understand and use Dyspnea scale  Yes       Intervention  Provide education and explanation on how to use Dyspnea scale       Expected Outcomes  Short Term: Able to use Dyspnea scale daily in rehab to express subjective sense of shortness of breath during exertion;Long Term: Able to use Dyspnea scale to guide intensity level when exercising independently       Knowledge and understanding of Target Heart Rate Range (THRR)  Yes       Intervention  Provide education and explanation of THRR including how the numbers were predicted and where they are located for reference       Expected Outcomes  Short Term: Able to state/look up THRR;Long Term: Able to use THRR to govern intensity when exercising independently;Short Term: Able to use daily as guideline for intensity in rehab       Able to check pulse independently  Yes       Intervention  Provide education and demonstration on how to check pulse  in carotid and radial arteries.;Review the importance of being able to check your own pulse for safety during independent exercise       Expected Outcomes  Short Term: Able to explain why pulse checking is important during independent exercise;Long Term: Able to check pulse independently and accurately       Understanding of Exercise Prescription  Yes       Intervention  Provide education, explanation, and written materials on patient's individual exercise prescription       Expected Outcomes  Short Term: Able to explain program exercise prescription;Long Term: Able to explain home exercise prescription to exercise independently          Exercise Goals Re-Evaluation :   Discharge Exercise Prescription (Final Exercise Prescription Changes): Exercise Prescription Changes - 12/12/17 1200      Response to Exercise   Blood Pressure (Admit)  132/64    Blood Pressure (Exercise)  166/72    Blood Pressure (Exit)  144/66    Heart Rate (Admit)  99 bpm    Heart Rate (Exercise)  156 bpm     Heart Rate (Exit)  98 bpm    Oxygen Saturation (Admit)  99 %    Oxygen Saturation (Exercise)  97 %    Rating of Perceived Exertion (Exercise)  17    Perceived Dyspnea (Exercise)  1    Symptoms  knee pain 8/10, SOB    Comments  walk test results       Nutrition:  Target Goals: Understanding of nutrition guidelines, daily intake of sodium '1500mg'$ , cholesterol '200mg'$ , calories 30% from fat and 7% or less from saturated fats, daily to have 5 or more servings of fruits and vegetables.  Biometrics: Pre Biometrics - 12/12/17 1241      Pre Biometrics   Height  6' 2.5" (1.892 m)    Weight  (!) 322 lb 11.2 oz (146.4 kg)    Waist Circumference  51 inches    Hip Circumference  47 inches    Waist to Hip Ratio  1.09 %    BMI (Calculated)  40.89    Single Leg Stand  2.94 seconds        Nutrition Therapy Plan and Nutrition Goals: Nutrition Therapy & Goals - 12/12/17 1235      Intervention Plan   Intervention  Prescribe, educate and counsel regarding individualized specific dietary modifications aiming towards targeted core components such as weight, hypertension, lipid management, diabetes, heart failure and other comorbidities.;Nutrition handout(s) given to patient.    Expected Outcomes  Short Term Goal: Understand basic principles of dietary content, such as calories, fat, sodium, cholesterol and nutrients.;Short Term Goal: A plan has been developed with personal nutrition goals set during dietitian appointment.;Long Term Goal: Adherence to prescribed nutrition plan.       Nutrition Assessments: Nutrition Assessments - 12/12/17 1057      MEDFICTS Scores   Pre Score  40       Nutrition Goals Re-Evaluation:   Nutrition Goals Discharge (Final Nutrition Goals Re-Evaluation):   Psychosocial: Target Goals: Acknowledge presence or absence of significant depression and/or stress, maximize coping skills, provide positive support system. Participant is able to verbalize types and  ability to use techniques and skills needed for reducing stress and depression.   Initial Review & Psychosocial Screening: Initial Psych Review & Screening - 12/12/17 1232      Initial Review   Current issues with  Current Stress Concerns    Source of Stress Concerns  Unable to perform  yard/household activities    Comments  Christopher [name] has had a right knee replacement and needs his left one done. This causes some discomfort while doing daily activities. 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 He says he gets around pretty well if he has a cart, but wants to get stronger doing other things around the house and hobbies. His son, daughter-in-law, and granddaughter (34years old) moved in with him ever since his wife passed away a few years ago. He loves having his family around. He is starting to notice a good difference since being put on fluid pills and is motivated to work hard  so he can get his other knee replaced.        Family Dynamics   Good Support System?  Yes   son, daughter in law, daughter     Barriers   Psychosocial barriers to participate in program  There are no identifiable barriers or psychosocial needs.;The patient should benefit from training in stress management and relaxation.      Screening Interventions   Interventions  Encouraged to exercise;Program counselor consult;To provide support and resources with identified psychosocial needs;Provide feedback about the scores to participant    Expected Outcomes  Short Term goal: Utilizing psychosocial counselor, staff and physician to assist with identification of specific Stressors or current issues interfering with healing process. Setting desired goal for each stressor or current issue identified.;Long Term Goal: Stressors or current issues are controlled or eliminated.;Long Term goal: The participant improves quality of Life and PHQ9 Scores as seen by post scores and/or verbalization of changes;Short Term  goal: Identification and review with participant of any Quality of Life or Depression concerns found by scoring the questionnaire.       Quality of Life Scores:  Quality of Life - 12/12/17 1059      Quality of Life   Select  Quality of Life      Quality of Life Scores   Health/Function Pre  18.67 %    Socioeconomic Pre  25.21 %    Psych/Spiritual Pre  26.57 %    Family Pre  28.5 %    GLOBAL Pre  22.92 %      Scores of 19 and below usually indicate a poorer quality of life in these areas.  A difference of  2-3 points is a clinically meaningful difference.  A difference of 2-3 points in the total score of the Quality of Life Index has been associated with significant improvement in overall quality of life, self-image, physical symptoms, and general health in studies assessing change in quality of life.  PHQ-9: Recent Review Flowsheet Data    Depression screen [facility] 2/9 12/12/2017 05/20/2017 12/16/2016 08/19/2015 10/22/2014   Decreased Interest 0 0 0 0 0   Down, Depressed, Hopeless 0 0 0 0 0   PHQ - 2 Score 0 0 0 0 0   Altered sleeping 0 0 0 - -   Tired, decreased energy '1 1 2  '$ - -   Change in appetite 0 0 0 - -   Feeling bad or failure about yourself  0 0 0 - -   Trouble concentrating 0 0 0 - -   Moving slowly or fidgety/restless 0 0 0 - -   Suicidal thoughts 0 0 0 - -   PHQ-9 Score '1 1 2 '$ - -   Difficult doing work/chores Not difficult at all Not difficult at all Not difficult at all - -  Interpretation of Total Score  Total Score Depression Severity:  1-4 = Minimal depression, 5-9 = Mild depression, 10-14 = Moderate depression, 15-19 = Moderately severe depression, 20-27 = Severe depression   Psychosocial Evaluation and Intervention:   Psychosocial Re-Evaluation:   Psychosocial Discharge (Final Psychosocial Re-Evaluation):   Vocational Rehabilitation: Provide vocational rehab assistance to qualifying candidates.   Vocational Rehab Evaluation & Intervention: Vocational  Rehab - 12/12/17 1232      Initial Vocational Rehab Evaluation & Intervention   Assessment shows need for Vocational Rehabilitation  No       Education: Education Goals: Education classes will be provided on a variety of topics geared toward better understanding of heart health and risk factor modification. Participant will state understanding/return demonstration of topics presented as noted by education test scores.  Learning Barriers/Preferences: Learning Barriers/Preferences - 12/12/17 1232      Learning Barriers/Preferences   Learning Barriers  Exercise Concerns    Learning Preferences  Written Material       Education Topics:  AED/CPR: - Group verbal and written instruction with the use of models to demonstrate the basic use of the AED with the basic ABC's of resuscitation.   General Nutrition Guidelines/Fats and Fiber: -Group instruction provided by verbal, written material, models and posters to present the general guidelines for heart healthy nutrition. Gives an explanation and review of dietary fats and fiber.   Controlling Sodium/Reading Food Labels: -Group verbal and written material supporting the discussion of sodium use in heart healthy nutrition. Review and explanation with models, verbal and written materials for utilization of the food label.   Exercise Physiology & General Exercise Guidelines: - Group verbal and written instruction with models to review the exercise physiology of the cardiovascular system and associated critical values. Provides general exercise guidelines with specific guidelines to those with heart or lung disease.    Aerobic Exercise & Resistance Training: - Gives group verbal and written instruction on the various components of exercise. Focuses on aerobic and resistive training programs and the benefits of this training and how to safely progress through these programs..   Flexibility, Balance, Mind/Body Relaxation: Provides group  verbal/written instruction on the benefits of flexibility and balance training, including mind/body exercise modes such as yoga, pilates and tai chi.  Demonstration and skill practice provided.   Stress and Anxiety: - Provides group verbal and written instruction about the health risks of elevated stress and causes of high stress.  Discuss the correlation between heart/lung disease and anxiety and treatment options. Review healthy ways to manage with stress and anxiety.   Depression: - Provides group verbal and written instruction on the correlation between heart/lung disease and depressed mood, treatment options, and the stigmas associated with seeking treatment.   Anatomy & Physiology of the Heart: - Group verbal and written instruction and models provide basic cardiac anatomy and physiology, with the coronary electrical and arterial systems. Review of Valvular disease and Heart Failure   Cardiac Procedures: - Group verbal and written instruction to review commonly prescribed medications for heart disease. Reviews the medication, class of the drug, and side effects. Includes the steps to properly store meds and maintain the prescription regimen. (beta blockers and nitrates)   Cardiac Medications I: - Group verbal and written instruction to review commonly prescribed medications for heart disease. Reviews the medication, class of the drug, and side effects. Includes the steps to properly store meds and maintain the prescription regimen.   Cardiac Medications II: -Group verbal and written instruction to  review commonly prescribed medications for heart disease. Reviews the medication, class of the drug, and side effects. (all other drug classes)    Go Sex-Intimacy & Heart Disease, Get SMART - Goal Setting: - Group verbal and written instruction through game format to discuss heart disease and the return to sexual intimacy. Provides group verbal and written material to discuss and apply  goal setting through the application of the S.M.A.R.T. Method.   Other Matters of the Heart: - Provides group verbal, written materials and models to describe Stable Angina and Peripheral Artery. Includes description of the disease process and treatment options available to the cardiac patient.   Exercise & Equipment Safety: - Individual verbal instruction and demonstration of equipment use and safety with use of the equipment.   Cardiac Rehab from 12/12/2017 in [facility] Cardiac and Pulmonary Rehab  Date  12/12/17  Educator  [facility]  Instruction Review Code  1- Verbalizes Understanding      Infection Prevention: - Provides verbal and written material to individual with discussion of infection control including proper hand washing and proper equipment cleaning during exercise session.   Cardiac Rehab from 12/12/2017 in [facility] Cardiac and Pulmonary Rehab  Date  12/12/17  Educator  [facility]  Instruction Review Code  1- Verbalizes Understanding      Falls Prevention: - Provides verbal and written material to individual with discussion of falls prevention and safety.   Cardiac Rehab from 12/12/2017 in [facility] Cardiac and Pulmonary Rehab  Date  12/12/17  Educator  [facility]  Instruction Review Code  1- Verbalizes Understanding      Diabetes: - Individual verbal and written instruction to review signs/symptoms of diabetes, desired ranges of glucose level fasting, after meals and with exercise. Acknowledge that pre and post exercise glucose checks will be done for 3 sessions at entry of program.   Know Your Numbers and Risk Factors: -Group verbal and written instruction about important numbers in your health.  Discussion of what are risk factors and how they play a role in the disease process.  Review of Cholesterol, Blood Pressure, Diabetes, and BMI and the role they play in your overall health.   Sleep Hygiene: -Provides group verbal and written instruction about how sleep can affect your health.  Define  sleep hygiene, discuss sleep cycles and impact of sleep habits. Review good sleep hygiene tips.    Other: -Provides group and verbal instruction on various topics (see comments)   Knowledge Questionnaire Score: Knowledge Questionnaire Score - 12/12/17 1057      Knowledge Questionnaire Score   Pre Score  21/26   test reviewed with pt today      Core Components/Risk Factors/Patient Goals at Admission: Personal Goals and Risk Factors at Admission - 12/12/17 1230      Core Components/Risk Factors/Patient Goals on Admission    Weight Management  Yes;Obesity;Weight Loss    Intervention  Weight Management: Develop a combined nutrition and exercise program designed to reach desired caloric intake, while maintaining appropriate intake of nutrient and fiber, sodium and fats, and appropriate energy expenditure required for the weight goal.;Weight Management: Provide education and appropriate resources to help participant work on and attain dietary goals.;Weight Management/Obesity: Establish reasonable short term and long term weight goals.;Obesity: Provide education and appropriate resources to help participant work on and attain dietary goals.    Admit Weight  322 lb 11.2 oz (146.4 kg)    Goal Weight: Short Term  317 lb (143.8 kg)    Goal Weight: Long Term  275 lb (124.7 kg)    Expected Outcomes  Short Term: Continue to assess and modify interventions until short term weight is achieved;Long Term: Adherence to nutrition and physical activity/exercise program aimed toward attainment of established weight goal;Weight Loss: Understanding of general recommendations for a balanced deficit meal plan, which promotes 1-2 lb weight loss per week and includes a negative energy balance of [phone] kcal/d;Understanding recommendations for meals to include 15-35% energy as protein, 25-35% energy from fat, 35-60% energy from carbohydrates, less than '200mg'$  of dietary cholesterol, 20-35 gm of total fiber  daily;Understanding of distribution of calorie intake throughout the day with the consumption of 4-5 meals/snacks    Heart Failure  Yes    Intervention  Provide a combined exercise and nutrition program that is supplemented with education, support and counseling about heart failure. Directed toward relieving symptoms such as shortness of breath, decreased exercise tolerance, and extremity edema.    Expected Outcomes  Improve functional capacity of life;Short term: Attendance in program 2-3 days a week with increased exercise capacity. Reported lower sodium intake. Reported increased fruit and vegetable intake. Reports medication compliance.;Short term: Daily weights obtained and reported for increase. Utilizing diuretic protocols set by physician.;Long term: Adoption of self-care skills and reduction of barriers for early signs and symptoms recognition and intervention leading to self-care maintenance.    Hypertension  Yes    Intervention  Monitor prescription use compliance.;Provide education on lifestyle modifcations including regular physical activity/exercise, weight management, moderate sodium restriction and increased consumption of fresh fruit, vegetables, and low fat dairy, alcohol moderation, and smoking cessation.    Expected Outcomes  Short Term: Continued assessment and intervention until BP is < 140/32m HG in hypertensive participants. < 130/89mHG in hypertensive participants with diabetes, heart failure or chronic kidney disease.;Long Term: Maintenance of blood pressure at goal levels.    Lipids  Yes    Intervention  Provide education and support for participant on nutrition & aerobic/resistive exercise along with prescribed medications to achieve LDL '70mg'$ , HDL >'40mg'$ .    Expected Outcomes  Short Term: Participant states understanding of desired cholesterol values and is compliant with medications prescribed. Participant is following exercise prescription and nutrition guidelines.;Long  Term: Cholesterol controlled with medications as prescribed, with individualized exercise RX and with personalized nutrition plan. Value goals: LDL < '70mg'$ , HDL > 40 mg.       Core Components/Risk Factors/Patient Goals Review:    Core Components/Risk Factors/Patient Goals at Discharge (Final Review):    ITP Comments: ITP Comments    Row Name 12/12/17 1226           ITP Comments  Med Review completed. Initial ITP created. Diagnosis can be found in [facility]/27          Comments: Initial ITP

## 2017-12-12 NOTE — Patient Instructions (Signed)
Patient Instructions  Patient Details  Name: Christopher [name] MRN: [id number] Date of Birth: 1945/06/02 Referring Provider:  Yolonda Kida, MD  Below are your personal goals for exercise, nutrition, and risk factors. Our goal is to help you stay on track towards obtaining and maintaining these goals. We will be discussing your progress on these goals with you throughout the program.  Initial Exercise Prescription: Initial Exercise Prescription - 12/12/17 1200      Date of Initial Exercise RX and Referring Provider   Date  12/12/17    Referring Provider  Lujean Amel MD      Treadmill   MPH  1.2    Grade  0.5    Minutes  15    METs  2      NuStep   Level  1    SPM  80    Minutes  15    METs  2      REL-XR   Level  1    Speed  50    Minutes  15    METs  2      Prescription Details   Frequency (times per week)  3    Duration  Progress to 45 minutes of aerobic exercise without signs/symptoms of physical distress      Intensity   THRR 40-80% of Max Heartrate  119-140    Ratings of Perceived Exertion  11-13    Perceived Dyspnea  0-4      Progression   Progression  Continue to progress workloads to maintain intensity without signs/symptoms of physical distress.      Resistance Training   Training Prescription  Yes    Weight  3 lbs    Reps  10-15       Exercise Goals: Frequency: Be able to perform aerobic exercise two to three times per week in program working toward 2-5 days per week of home exercise.  Intensity: Work with a perceived exertion of 11 (fairly light) - 15 (hard) while following your exercise prescription.  We will make changes to your prescription with you as you progress through the program.   Duration: Be able to do 30 to 45 minutes of continuous aerobic exercise in addition to a 5 minute warm-up and a 5 minute cool-down routine.   Nutrition Goals: Your personal nutrition goals will be established when you do your nutrition analysis  with the dietician.  The following are general nutrition guidelines to follow: Cholesterol < 200mg /day Sodium < 1500mg /day Fiber: Men over 50 yrs - 30 grams per day  Personal Goals: Personal Goals and Risk Factors at Admission - 12/12/17 1230      Core Components/Risk Factors/Patient Goals on Admission    Weight Management  Yes;Obesity;Weight Loss    Intervention  Weight Management: Develop a combined nutrition and exercise program designed to reach desired caloric intake, while maintaining appropriate intake of nutrient and fiber, sodium and fats, and appropriate energy expenditure required for the weight goal.;Weight Management: Provide education and appropriate resources to help participant work on and attain dietary goals.;Weight Management/Obesity: Establish reasonable short term and long term weight goals.;Obesity: Provide education and appropriate resources to help participant work on and attain dietary goals.    Admit Weight  322 lb 11.2 oz (146.4 kg)    Goal Weight: Short Term  317 lb (143.8 kg)    Goal Weight: Long Term  275 lb (124.7 kg)    Expected Outcomes  Short Term: Continue to assess and  modify interventions until short term weight is achieved;Long Term: Adherence to nutrition and physical activity/exercise program aimed toward attainment of established weight goal;Weight Loss: Understanding of general recommendations for a balanced deficit meal plan, which promotes 1-2 lb weight loss per week and includes a negative energy balance of [phone] kcal/d;Understanding recommendations for meals to include 15-35% energy as protein, 25-35% energy from fat, 35-60% energy from carbohydrates, less than 200mg  of dietary cholesterol, 20-35 gm of total fiber daily;Understanding of distribution of calorie intake throughout the day with the consumption of 4-5 meals/snacks    Heart Failure  Yes    Intervention  Provide a combined exercise and nutrition program that is supplemented with education,  support and counseling about heart failure. Directed toward relieving symptoms such as shortness of breath, decreased exercise tolerance, and extremity edema.    Expected Outcomes  Improve functional capacity of life;Short term: Attendance in program 2-3 days a week with increased exercise capacity. Reported lower sodium intake. Reported increased fruit and vegetable intake. Reports medication compliance.;Short term: Daily weights obtained and reported for increase. Utilizing diuretic protocols set by physician.;Long term: Adoption of self-care skills and reduction of barriers for early signs and symptoms recognition and intervention leading to self-care maintenance.    Hypertension  Yes    Intervention  Monitor prescription use compliance.;Provide education on lifestyle modifcations including regular physical activity/exercise, weight management, moderate sodium restriction and increased consumption of fresh fruit, vegetables, and low fat dairy, alcohol moderation, and smoking cessation.    Expected Outcomes  Short Term: Continued assessment and intervention until BP is < 140/75mm HG in hypertensive participants. < 130/33mm HG in hypertensive participants with diabetes, heart failure or chronic kidney disease.;Long Term: Maintenance of blood pressure at goal levels.    Lipids  Yes    Intervention  Provide education and support for participant on nutrition & aerobic/resistive exercise along with prescribed medications to achieve LDL 70mg , HDL >40mg .    Expected Outcomes  Short Term: Participant states understanding of desired cholesterol values and is compliant with medications prescribed. Participant is following exercise prescription and nutrition guidelines.;Long Term: Cholesterol controlled with medications as prescribed, with individualized exercise RX and with personalized nutrition plan. Value goals: LDL < 70mg , HDL > 40 mg.       Tobacco Use Initial Evaluation: Social History   Tobacco Use   Smoking Status Former Smoker  Smokeless Tobacco Never Used    Exercise Goals and Review: Exercise Goals    Row Name 12/12/17 1240             Exercise Goals   Increase Physical Activity  Yes       Intervention  Provide advice, education, support and counseling about physical activity/exercise needs.;Develop an individualized exercise prescription for aerobic and resistive training based on initial evaluation findings, risk stratification, comorbidities and participant's personal goals.       Expected Outcomes  Short Term: Attend rehab on a regular basis to increase amount of physical activity.;Long Term: Add in home exercise to make exercise part of routine and to increase amount of physical activity.;Long Term: Exercising regularly at least 3-5 days a week.       Increase Strength and Stamina  Yes       Intervention  Provide advice, education, support and counseling about physical activity/exercise needs.;Develop an individualized exercise prescription for aerobic and resistive training based on initial evaluation findings, risk stratification, comorbidities and participant's personal goals.       Expected Outcomes  Short Term: Increase  workloads from initial exercise prescription for resistance, speed, and METs.;Long Term: Improve cardiorespiratory fitness, muscular endurance and strength as measured by increased METs and functional capacity (6MWT);Short Term: Perform resistance training exercises routinely during rehab and add in resistance training at home       Able to understand and use rate of perceived exertion (RPE) scale  Yes       Intervention  Provide education and explanation on how to use RPE scale       Expected Outcomes  Short Term: Able to use RPE daily in rehab to express subjective intensity level;Long Term:  Able to use RPE to guide intensity level when exercising independently       Able to understand and use Dyspnea scale  Yes       Intervention  Provide education and  explanation on how to use Dyspnea scale       Expected Outcomes  Short Term: Able to use Dyspnea scale daily in rehab to express subjective sense of shortness of breath during exertion;Long Term: Able to use Dyspnea scale to guide intensity level when exercising independently       Knowledge and understanding of Target Heart Rate Range (THRR)  Yes       Intervention  Provide education and explanation of THRR including how the numbers were predicted and where they are located for reference       Expected Outcomes  Short Term: Able to state/look up THRR;Long Term: Able to use THRR to govern intensity when exercising independently;Short Term: Able to use daily as guideline for intensity in rehab       Able to check pulse independently  Yes       Intervention  Provide education and demonstration on how to check pulse in carotid and radial arteries.;Review the importance of being able to check your own pulse for safety during independent exercise       Expected Outcomes  Short Term: Able to explain why pulse checking is important during independent exercise;Long Term: Able to check pulse independently and accurately       Understanding of Exercise Prescription  Yes       Intervention  Provide education, explanation, and written materials on patient's individual exercise prescription       Expected Outcomes  Short Term: Able to explain program exercise prescription;Long Term: Able to explain home exercise prescription to exercise independently          Copy of goals given to participant.

## 2017-12-19 ENCOUNTER — Encounter: Payer: Medicare Other | Attending: Internal Medicine | Admitting: *Deleted

## 2017-12-19 DIAGNOSIS — I5022 Chronic systolic (congestive) heart failure: Secondary | ICD-10-CM | POA: Diagnosis not present

## 2017-12-19 NOTE — Progress Notes (Signed)
Daily Session Note  Patient Details  Name: Christopher [name] MRN: [id number] Date of Birth: 1945/06/17 Referring Provider:     Cardiac Rehab from 12/12/2017 in [facility] Cardiac and Pulmonary Rehab  Referring Provider  Lujean Amel MD      Encounter Date: 12/19/2017  Check In: Session Check In - 12/19/17 0741      Check-In   Supervising physician immediately available to respond to emergencies  See telemetry face sheet for immediately available ER MD    Location  [facility]-Cardiac & Pulmonary Rehab    Staff Present  Alberteen Sam, MA, RCEP, CCRP, Exercise Physiologist;Susanne Bice, RN, BSN, CCRP;Kelly Hayes, BS, ACSM CEP, Exercise Physiologist    Medication changes reported      No    Fall or balance concerns reported     No    Warm-up and Cool-down  Performed on first and last piece of equipment    Resistance Training Performed  Yes    VAD Patient?  No    PAD/SET Patient?  No      Pain Assessment   Currently in Pain?  No/denies          Social History   Tobacco Use  Smoking Status Former Smoker  Smokeless Tobacco Never Used    Goals Met:  Independence with exercise equipment Exercise tolerated well Personal goals reviewed No report of cardiac concerns or symptoms Strength training completed today  Goals Unmet:  Not Applicable  Comments: First full day of exercise!  Patient was oriented to gym and equipment including functions, settings, policies, and procedures.  Patient's individual exercise prescription and treatment plan were reviewed.  All starting workloads were established based on the results of the 6 minute walk test done at initial orientation visit.  The plan for exercise progression was also introduced and progression will be customized based on patient's performance and goals.    Dr. Emily Filbert is Medical Director for [name].

## 2017-12-21 ENCOUNTER — Encounter: Payer: Self-pay | Admitting: *Deleted

## 2017-12-21 DIAGNOSIS — I5022 Chronic systolic (congestive) heart failure: Secondary | ICD-10-CM

## 2017-12-21 NOTE — Progress Notes (Signed)
Daily Session Note  Patient Details  Name: Christopher [name] MRN: [id number] Date of Birth: [birth year] Referring Provider:     Cardiac Rehab from 12/12/2017 in [facility] Cardiac and Pulmonary Rehab  Referring Provider  Lujean Amel MD      Encounter Date: 12/21/2017  Check In: Session Check In - 12/21/17 0732      Check-In   Supervising physician immediately available to respond to emergencies  See telemetry face sheet for immediately available ER MD    Location  [facility]-Cardiac & Pulmonary Rehab    Staff Present  Heath Lark, RN, BSN, CCRP;Joseph Darrin Nipper, [place], [place], [place], Exercise Physiologist    Medication changes reported      No    Fall or balance concerns reported     No    Warm-up and Cool-down  Performed on first and last piece of equipment    Resistance Training Performed  Yes    VAD Patient?  No    PAD/SET Patient?  No      Pain Assessment   Currently in Pain?  No/denies          Social History   Tobacco Use  Smoking Status Former Smoker  Smokeless Tobacco Never Used    Goals Met:  Independence with exercise equipment Exercise tolerated well No report of cardiac concerns or symptoms Strength training completed today  Goals Unmet:  Not Applicable  Comments: Pt able to follow exercise prescription today without complaint.  Will continue to monitor for progression.    Dr. Emily Filbert is Medical Director for [facility].

## 2017-12-21 NOTE — Progress Notes (Signed)
Cardiac Individual Treatment Plan  Patient Details  Name: Christopher [name] MRN: [id number] Date of Birth: 04-10-1945 Referring Provider:     Cardiac Rehab from 12/12/2017 in [facility] Cardiac and Pulmonary Rehab  Referring Provider  Lujean Amel MD      Initial Encounter Date:    Cardiac Rehab from 12/12/2017 in [facility] Cardiac and Pulmonary Rehab  Date  12/12/17      Visit Diagnosis: Heart failure, chronic systolic ([place])  Patient's Home Medications on Admission:  Current Outpatient Medications:  .  amLODipine (NORVASC) 5 MG tablet, TAKE 1 TABLET(5 MG) BY MOUTH DAILY, Disp: 90 tablet, Rfl: 0 .  amoxicillin-clavulanate (AUGMENTIN) 875-125 MG tablet, Take 1 tablet by mouth 2 (two) times daily., Disp: 20 tablet, Rfl: 0 .  Cholecalciferol (VITAMIN D) 2000 units tablet, Take by mouth., Disp: , Rfl:  .  clopidogrel (PLAVIX) 75 MG tablet, TAKE 1 TABLET BY MOUTH EVERY DAY AT 6:00AM, Disp: 90 tablet, Rfl: 1 .  clopidogrel (PLAVIX) 75 MG tablet, TAKE 1 TABLET BY MOUTH EVERY DAY AT 6 AM, Disp: 90 tablet, Rfl: 0 .  diclofenac sodium (VOLTAREN) 1 % GEL, Apply 4 g topically 4 (four) times daily. (Patient taking differently: Apply 4 g topically 4 (four) times daily. Dr Marry Guan), Disp: 1 Tube, Rfl: 1 .  etodolac (LODINE) 500 MG tablet, TAKE 1 TABLET BY MOUTH TWICE DAILY, Disp: 60 tablet, Rfl: 0 .  fenofibrate 160 MG tablet, Take 1 tablet (160 mg total) by mouth daily., Disp: 90 tablet, Rfl: 1 .  guaiFENesin-codeine (ROBITUSSIN AC) 100-10 MG/5ML syrup, Take 5 mLs by mouth 3 (three) times daily as needed for cough., Disp: 150 mL, Rfl: 0 .  metoprolol succinate (TOPROL-XL) 100 MG 24 hr tablet, TAKE 1 TABLET BY MOUTH EVERY DAY IMMEDIATELY FOLLOWING A MEAL, Disp: 90 tablet, Rfl: 0 .  niacin 500 MG CR capsule, Take 1 capsule (500 mg total) by mouth daily at 6 (six) AM., Disp: 90 capsule, Rfl: 1 .  ramipril (ALTACE) 10 MG capsule, TAKE 1 CAPSULE(10 MG) BY MOUTH DAILY, Disp: 90 capsule, Rfl: 0 .  ramipril  (ALTACE) 10 MG capsule, TAKE ONE CAPSULE BY MOUTH DAILY, Disp: 90 capsule, Rfl: 0 .  simvastatin (ZOCOR) 40 MG tablet, TAKE 1 TABLET BY MOUTH EVERY DAY AT 6 AM, Disp: 90 tablet, Rfl: 0 .  torsemide (DEMADEX) 10 MG tablet, Take 1 tablet by mouth daily. Dr Clayborn Bigness, Disp: , Rfl:   Past Medical History: Past Medical History:  Diagnosis Date  . Hyperlipidemia   . Hypertension   . 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   Exercise Prescription Goal: Initial exercise prescription builds to 30-45 minutes a day of aerobic activity, 2-3 days per week.  Home exercise guidelines will be given to patient during program as part of exercise prescription that the participant  will acknowledge.  Activity Barriers & Risk Stratification: Activity Barriers & Cardiac Risk Stratification - 12/12/17 1236      Activity Barriers & Cardiac Risk Stratification   Activity Barriers  Arthritis;Right Knee Replacement;Joint Problems;Shortness of Breath;Decreased Ventricular Function;Muscular Weakness;Deconditioning;Balance Concerns   needs left knee replacement   Cardiac Risk Stratification  High       6 Minute Walk: 6 Minute Walk    Row Name 12/12/17 1235         6 Minute Walk   Phase  Initial     Distance  694 feet     Walk Time  4.37 minutes     # of Rest Breaks  3 rest for knee pain: 27 sec, 38 sec, 33 sec     MPH   1.8     METS  1.94     RPE  17     Perceived Dyspnea   1     VO2 Peak  6.81     Symptoms  Yes (comment)     Comments  L knee pain 8/10 (made gait a little wobbly), SOB     Resting HR  99 bpm     Resting BP  132/64     Resting Oxygen Saturation   99 %     Exercise Oxygen Saturation  during 6 min walk  97 %     Max Ex. HR  156 bpm     Max Ex. BP  166/72     2 Minute Post BP  144/66        Oxygen Initial Assessment:   Oxygen Re-Evaluation:   Oxygen Discharge (Final Oxygen Re-Evaluation):   Initial Exercise Prescription: Initial Exercise Prescription - 12/12/17 1200      Date of Initial Exercise RX and Referring Provider   Date  12/12/17    Referring Provider  Lujean Amel MD      Treadmill   MPH  1.2    Grade  0.5    Minutes  15    METs  2      NuStep   Level  1    SPM  80    Minutes  15    METs  2      REL-XR   Level  1    Speed  50    Minutes  15    METs  2      Prescription Details   Frequency (times per week)  3    Duration  Progress to 45 minutes of aerobic exercise without signs/symptoms of physical distress      Intensity   THRR 40-80% of Max Heartrate  119-140    Ratings of Perceived Exertion  11-13    Perceived Dyspnea  0-4      Progression   Progression  Continue to progress workloads to maintain intensity without signs/symptoms of physical distress.      Resistance Training   Training Prescription  Yes    Weight  3 lbs    Reps  10-15       Perform Capillary Blood Glucose checks as needed.  Exercise Prescription Changes: Exercise Prescription Changes    Row Name 12/12/17 1200             Response to Exercise   Blood Pressure (Admit)  132/64       Blood Pressure (Exercise)  166/72       Blood Pressure (Exit)  144/66       Heart Rate (Admit)  99 bpm       Heart Rate (Exercise)  156 bpm       Heart Rate (Exit)  98 bpm       Oxygen Saturation (Admit)  99 %       Oxygen Saturation (Exercise)  97 %       Rating of Perceived  Exertion (Exercise)  17       Perceived Dyspnea (Exercise)  1       Symptoms  knee pain 8/10, SOB       Comments  walk test results          Exercise Comments: Exercise Comments    Row Name 12/19/17 0742           Exercise Comments  First full day of exercise!  Patient was oriented to gym and equipment including functions, settings, policies, and procedures.  Patient's individual exercise prescription and treatment plan were reviewed.  All starting workloads were established based on the results of the 6 minute walk test done at initial orientation visit.  The plan for exercise progression was also introduced and progression will be customized based on patient's performance and goals.          Exercise Goals and Review: Exercise Goals    Row Name 12/12/17 1240             Exercise Goals   Increase Physical Activity  Yes       Intervention  Provide advice, education, support and counseling about physical activity/exercise needs.;Develop an individualized exercise prescription for aerobic and resistive training based on initial evaluation findings, risk stratification, comorbidities and participant's personal goals.       Expected Outcomes  Short Term: Attend rehab on a regular basis to increase amount of physical activity.;Long Term: Add in home exercise to make exercise part of routine and to increase amount of physical activity.;Long Term: Exercising regularly at least 3-5 days a week.       Increase Strength and Stamina  Yes       Intervention  Provide advice, education, support and counseling about physical activity/exercise needs.;Develop an individualized exercise prescription for aerobic and resistive training based on initial evaluation findings, risk stratification, comorbidities and participant's personal goals.       Expected Outcomes  Short Term: Increase workloads from initial exercise prescription for resistance, speed, and METs.;Long Term: Improve cardiorespiratory  fitness, muscular endurance and strength as measured by increased METs and functional capacity (6MWT);Short Term: Perform resistance training exercises routinely during rehab and add in resistance training at home       Able to understand and use rate of perceived exertion (RPE) scale  Yes       Intervention  Provide education and explanation on how to use RPE scale       Expected Outcomes  Short Term: Able to use RPE daily in rehab to express subjective intensity level;Long Term:  Able to use RPE to guide intensity level when exercising independently       Able to understand and use Dyspnea scale  Yes       Intervention  Provide education and explanation on how to use Dyspnea scale       Expected Outcomes  Short Term: Able to use Dyspnea scale daily in rehab to express subjective sense of shortness of breath during exertion;Long Term: Able to use Dyspnea scale to guide intensity level when exercising independently       Knowledge and understanding of Target Heart Rate  Range (THRR)  Yes       Intervention  Provide education and explanation of THRR including how the numbers were predicted and where they are located for reference       Expected Outcomes  Short Term: Able to state/look up THRR;Long Term: Able to use THRR to govern intensity when exercising independently;Short Term: Able to use daily as guideline for intensity in rehab       Able to check pulse independently  Yes       Intervention  Provide education and demonstration on how to check pulse in carotid and radial arteries.;Review the importance of being able to check your own pulse for safety during independent exercise       Expected Outcomes  Short Term: Able to explain why pulse checking is important during independent exercise;Long Term: Able to check pulse independently and accurately       Understanding of Exercise Prescription  Yes       Intervention  Provide education, explanation, and written materials on patient's individual  exercise prescription       Expected Outcomes  Short Term: Able to explain program exercise prescription;Long Term: Able to explain home exercise prescription to exercise independently          Exercise Goals Re-Evaluation : Exercise Goals Re-Evaluation    Row Name 12/19/17 0742             Exercise Goal Re-Evaluation   Exercise Goals Review  Increase Physical Activity;Increase Strength and Stamina;Able to understand and use rate of perceived exertion (RPE) scale;Knowledge and understanding of Target Heart Rate Range (THRR);Understanding of Exercise Prescription       Comments  Reviewed RPE scale, THR and program prescription with pt today.  Pt voiced understanding and was given a copy of goals to take home.        Expected Outcomes  Short: Use RPE daily to regulate intensity. Long: Follow program prescription in THR.          Discharge Exercise Prescription (Final Exercise Prescription Changes): Exercise Prescription Changes - 12/12/17 1200      Response to Exercise   Blood Pressure (Admit)  132/64    Blood Pressure (Exercise)  166/72    Blood Pressure (Exit)  144/66    Heart Rate (Admit)  99 bpm    Heart Rate (Exercise)  156 bpm    Heart Rate (Exit)  98 bpm    Oxygen Saturation (Admit)  99 %    Oxygen Saturation (Exercise)  97 %    Rating of Perceived Exertion (Exercise)  17    Perceived Dyspnea (Exercise)  1    Symptoms  knee pain 8/10, SOB    Comments  walk test results       Nutrition:  Target Goals: Understanding of nutrition guidelines, daily intake of sodium '1500mg'$ , cholesterol '200mg'$ , calories 30% from fat and 7% or less from saturated fats, daily to have 5 or more servings of fruits and vegetables.  Biometrics: Pre Biometrics - 12/12/17 1241      Pre Biometrics   Height  6' 2.5" (1.892 m)    Weight  (!) 322 lb 11.2 oz (146.4 kg)    Waist Circumference  51 inches    Hip Circumference  47 inches    Waist to Hip Ratio  1.09 %    BMI (Calculated)  40.89     Single Leg Stand  2.94 seconds        Nutrition Therapy Plan and Nutrition Goals:  Nutrition Therapy & Goals - 12/12/17 1235      Intervention Plan   Intervention  Prescribe, educate and counsel regarding individualized specific dietary modifications aiming towards targeted core components such as weight, hypertension, lipid management, diabetes, heart failure and other comorbidities.;Nutrition handout(s) given to patient.    Expected Outcomes  Short Term Goal: Understand basic principles of dietary content, such as calories, fat, sodium, cholesterol and nutrients.;Short Term Goal: A plan has been developed with personal nutrition goals set during dietitian appointment.;Long Term Goal: Adherence to prescribed nutrition plan.       Nutrition Assessments: Nutrition Assessments - 12/12/17 1057      MEDFICTS Scores   Pre Score  40       Nutrition Goals Re-Evaluation:   Nutrition Goals Discharge (Final Nutrition Goals Re-Evaluation):   Psychosocial: Target Goals: Acknowledge presence or absence of significant depression and/or stress, maximize coping skills, provide positive support system. Participant is able to verbalize types and ability to use techniques and skills needed for reducing stress and depression.   Initial Review & Psychosocial Screening: Initial Psych Review & Screening - 12/12/17 1232      Initial Review   Current issues with  Current Stress Concerns    Source of Stress Concerns  Unable to perform yard/household activities    Comments  Christopher [name] has had a right knee replacement and needs his left one done. This causes some discomfort while doing daily activities. 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 He says he gets around pretty well if he has a cart, but wants to get stronger doing other things around the house and hobbies. His son, daughter-in-law, and granddaughter (16years old) moved in with him ever since his wife passed away a few years  ago. He loves having his family around. He is starting to notice a good difference since being put on fluid pills and is motivated to work hard  so he can get his other knee replaced.        Family Dynamics   Good Support System?  Yes   son, daughter in law, daughter     Barriers   Psychosocial barriers to participate in program  There are no identifiable barriers or psychosocial needs.;The patient should benefit from training in stress management and relaxation.      Screening Interventions   Interventions  Encouraged to exercise;Program counselor consult;To provide support and resources with identified psychosocial needs;Provide feedback about the scores to participant    Expected Outcomes  Short Term goal: Utilizing psychosocial counselor, staff and physician to assist with identification of specific Stressors or current issues interfering with healing process. Setting desired goal for each stressor or current issue identified.;Long Term Goal: Stressors or current issues are controlled or eliminated.;Long Term goal: The participant improves quality of Life and PHQ9 Scores as seen by post scores and/or verbalization of changes;Short Term goal: Identification and review with participant of any Quality of Life or Depression concerns found by scoring the questionnaire.       Quality of Life Scores:  Quality of Life - 12/12/17 1059      Quality of Life   Select  Quality of Life      Quality of Life Scores   Health/Function Pre  18.67 %    Socioeconomic Pre  25.21 %    Psych/Spiritual Pre  26.57 %    Family Pre  28.5 %    GLOBAL Pre  22.92 %  Scores of 19 and below usually indicate a poorer quality of life in these areas.  A difference of  2-3 points is a clinically meaningful difference.  A difference of 2-3 points in the total score of the Quality of Life Index has been associated with significant improvement in overall quality of life, self-image, physical symptoms, and general  health in studies assessing change in quality of life.  PHQ-9: Recent Review Flowsheet Data    Depression screen [facility] 2/9 12/12/2017 05/20/2017 12/16/2016 08/19/2015 10/22/2014   Decreased Interest 0 0 0 0 0   Down, Depressed, Hopeless 0 0 0 0 0   PHQ - 2 Score 0 0 0 0 0   Altered sleeping 0 0 0 - -   Tired, decreased energy '1 1 2  '$ - -   Change in appetite 0 0 0 - -   Feeling bad or failure about yourself  0 0 0 - -   Trouble concentrating 0 0 0 - -   Moving slowly or fidgety/restless 0 0 0 - -   Suicidal thoughts 0 0 0 - -   PHQ-9 Score '1 1 2 '$ - -   Difficult doing work/chores Not difficult at all Not difficult at all Not difficult at all - -     Interpretation of Total Score  Total Score Depression Severity:  1-4 = Minimal depression, 5-9 = Mild depression, 10-14 = Moderate depression, 15-19 = Moderately severe depression, 20-27 = Severe depression   Psychosocial Evaluation and Intervention: Psychosocial Evaluation - 12/19/17 0954      Psychosocial Evaluation & Interventions   Interventions  Encouraged to exercise with the program and follow exercise prescription    Comments  Counselor met with Christopher [name]) today for initial psychosocial evaluation.  He has had some cardiac issues and was recommended to attend this program by his Dr.  Barnabas [name] also has some chronic knee problems and is awaiting his second knee to be replaced following completion of this program.  He has a strong support system with a son and his family who live in the same home with him and a daughter and sister locally.  He is also actively involved in his local church.  Christopher [name] reports sleeping well; eating well and denies a history or current symptoms of depression or anxiety.  He reports his mood being typically positive.  Christopher [name] states his primary stress is his health at this time.  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  Christopher [name] has goals to improve his health with his  heart; exercise more consistently and lose some weight while in this program.  Staff wil follow with him.     Expected Outcomes  Short:  Christopher [name] will meet with the dietician to address his weight loss goals.  He will exercise consistently with the CR program treatment plan to improve his heart health and develop a routine.  Long:  Christopher [name] will exercise for his overall health and be strong enough to have his knee replacement surgery.      Continue Psychosocial Services   Follow up required by staff       Psychosocial Re-Evaluation:   Psychosocial Discharge (Final Psychosocial Re-Evaluation):   Vocational Rehabilitation: Provide vocational rehab assistance to qualifying candidates.   Vocational Rehab Evaluation & Intervention: Vocational Rehab - 12/12/17 1232      Initial Vocational Rehab Evaluation & Intervention   Assessment shows need for Vocational Rehabilitation  No       Education: Education Goals: Education classes will be provided on a variety of topics geared toward better understanding of heart health and risk factor modification. Participant will state understanding/return demonstration of topics presented as noted by education test scores.  Learning Barriers/Preferences: Learning Barriers/Preferences - 12/12/17 1232      Learning Barriers/Preferences   Learning Barriers  Exercise Concerns    Learning Preferences  Written Material       Education Topics:  AED/CPR: - Group verbal and written instruction with the use of models to demonstrate the basic use of the AED with the basic ABC's of resuscitation.   General Nutrition Guidelines/Fats and Fiber: -Group instruction provided by verbal, written material, models and posters to present the general guidelines for heart healthy nutrition. Gives an explanation and review of dietary fats and fiber.   Controlling Sodium/Reading Food Labels: -Group verbal and written material supporting the discussion of sodium use in heart  healthy nutrition. Review and explanation with models, verbal and written materials for utilization of the food label.   Exercise Physiology & General Exercise Guidelines: - Group verbal and written instruction with models to review the exercise physiology of the cardiovascular system and associated critical values. Provides general exercise guidelines with specific guidelines to those with heart or lung disease.    Aerobic Exercise & Resistance Training: - Gives group verbal and written instruction on the various components of exercise. Focuses on aerobic and resistive training programs and the benefits of this training and how to safely progress through these programs..   Flexibility, Balance, Mind/Body Relaxation: Provides group verbal/written instruction on the benefits of flexibility and balance training, including mind/body exercise modes such as yoga, pilates and tai chi.  Demonstration and skill practice provided.   Cardiac Rehab from 12/19/2017 in [facility] Cardiac and Pulmonary Rehab  Date  12/19/17  Educator  [facility]  Instruction Review Code  1- Verbalizes Understanding      Stress and Anxiety: - Provides group verbal and written instruction about the health risks of elevated stress and causes of high stress.  Discuss the correlation between heart/lung disease and anxiety and treatment options. Review healthy ways to manage with stress and anxiety.   Depression: - Provides group verbal and written instruction on the correlation between heart/lung disease and depressed mood, treatment options, and the stigmas associated with seeking treatment.   Anatomy & Physiology of the Heart: - Group verbal and written instruction and models provide basic cardiac anatomy and physiology, with the coronary electrical and arterial systems. Review of Valvular disease and Heart Failure   Cardiac Procedures: - Group verbal and written instruction to review commonly prescribed medications for heart  disease. Reviews the medication, class of the drug, and side effects. Includes the steps to properly store meds and maintain the prescription regimen. (beta blockers and nitrates)   Cardiac Medications I: - Group verbal and written instruction to review commonly prescribed medications for heart disease. Reviews the medication, class of the drug, and side effects. Includes the steps to properly store meds and maintain the prescription regimen.   Cardiac Medications II: -Group verbal and written instruction to review commonly prescribed medications for heart disease. Reviews the medication, class of the drug, and side effects. (all other drug classes)    Go Sex-Intimacy & Heart Disease, Get SMART - Goal Setting: - Group verbal and written instruction through game format to discuss heart disease and the return to sexual intimacy. Provides group verbal and written material to  discuss and apply goal setting through the application of the S.M.A.R.T. Method.   Other Matters of the Heart: - Provides group verbal, written materials and models to describe Stable Angina and Peripheral Artery. Includes description of the disease process and treatment options available to the cardiac patient.   Exercise & Equipment Safety: - Individual verbal instruction and demonstration of equipment use and safety with use of the equipment.   Cardiac Rehab from 12/19/2017 in [facility] Cardiac and Pulmonary Rehab  Date  12/12/17  Educator  [facility]  Instruction Review Code  1- Verbalizes Understanding      Infection Prevention: - Provides verbal and written material to individual with discussion of infection control including proper hand washing and proper equipment cleaning during exercise session.   Cardiac Rehab from 12/19/2017 in [facility] Cardiac and Pulmonary Rehab  Date  12/12/17  Educator  [facility]  Instruction Review Code  1- Verbalizes Understanding      Falls Prevention: - Provides verbal and written material to  individual with discussion of falls prevention and safety.   Cardiac Rehab from 12/19/2017 in [facility] Cardiac and Pulmonary Rehab  Date  12/12/17  Educator  [facility]  Instruction Review Code  1- Verbalizes Understanding      Diabetes: - Individual verbal and written instruction to review signs/symptoms of diabetes, desired ranges of glucose level fasting, after meals and with exercise. Acknowledge that pre and post exercise glucose checks will be done for 3 sessions at entry of program.   Know Your Numbers and Risk Factors: -Group verbal and written instruction about important numbers in your health.  Discussion of what are risk factors and how they play a role in the disease process.  Review of Cholesterol, Blood Pressure, Diabetes, and BMI and the role they play in your overall health.   Sleep Hygiene: -Provides group verbal and written instruction about how sleep can affect your health.  Define sleep hygiene, discuss sleep cycles and impact of sleep habits. Review good sleep hygiene tips.    Other: -Provides group and verbal instruction on various topics (see comments)   Knowledge Questionnaire Score: Knowledge Questionnaire Score - 12/12/17 1057      Knowledge Questionnaire Score   Pre Score  21/26   test reviewed with pt today      Core Components/Risk Factors/Patient Goals at Admission: Personal Goals and Risk Factors at Admission - 12/12/17 1230      Core Components/Risk Factors/Patient Goals on Admission    Weight Management  Yes;Obesity;Weight Loss    Intervention  Weight Management: Develop a combined nutrition and exercise program designed to reach desired caloric intake, while maintaining appropriate intake of nutrient and fiber, sodium and fats, and appropriate energy expenditure required for the weight goal.;Weight Management: Provide education and appropriate resources to help participant work on and attain dietary goals.;Weight Management/Obesity: Establish reasonable  short term and long term weight goals.;Obesity: Provide education and appropriate resources to help participant work on and attain dietary goals.    Admit Weight  322 lb 11.2 oz (146.4 kg)    Goal Weight: Short Term  317 lb (143.8 kg)    Goal Weight: Long Term  275 lb (124.7 kg)    Expected Outcomes  Short Term: Continue to assess and modify interventions until short term weight is achieved;Long Term: Adherence to nutrition and physical activity/exercise program aimed toward attainment of established weight goal;Weight Loss: Understanding of general recommendations for a balanced deficit meal plan, which promotes 1-2 lb weight loss per week and includes  a negative energy balance of [phone] kcal/d;Understanding recommendations for meals to include 15-35% energy as protein, 25-35% energy from fat, 35-60% energy from carbohydrates, less than '200mg'$  of dietary cholesterol, 20-35 gm of total fiber daily;Understanding of distribution of calorie intake throughout the day with the consumption of 4-5 meals/snacks    Heart Failure  Yes    Intervention  Provide a combined exercise and nutrition program that is supplemented with education, support and counseling about heart failure. Directed toward relieving symptoms such as shortness of breath, decreased exercise tolerance, and extremity edema.    Expected Outcomes  Improve functional capacity of life;Short term: Attendance in program 2-3 days a week with increased exercise capacity. Reported lower sodium intake. Reported increased fruit and vegetable intake. Reports medication compliance.;Short term: Daily weights obtained and reported for increase. Utilizing diuretic protocols set by physician.;Long term: Adoption of self-care skills and reduction of barriers for early signs and symptoms recognition and intervention leading to self-care maintenance.    Hypertension  Yes    Intervention  Monitor prescription use compliance.;Provide education on lifestyle modifcations  including regular physical activity/exercise, weight management, moderate sodium restriction and increased consumption of fresh fruit, vegetables, and low fat dairy, alcohol moderation, and smoking cessation.    Expected Outcomes  Short Term: Continued assessment and intervention until BP is < 140/4m HG in hypertensive participants. < 130/817mHG in hypertensive participants with diabetes, heart failure or chronic kidney disease.;Long Term: Maintenance of blood pressure at goal levels.    Lipids  Yes    Intervention  Provide education and support for participant on nutrition & aerobic/resistive exercise along with prescribed medications to achieve LDL '70mg'$ , HDL >'40mg'$ .    Expected Outcomes  Short Term: Participant states understanding of desired cholesterol values and is compliant with medications prescribed. Participant is following exercise prescription and nutrition guidelines.;Long Term: Cholesterol controlled with medications as prescribed, with individualized exercise RX and with personalized nutrition plan. Value goals: LDL < '70mg'$ , HDL > 40 mg.       Core Components/Risk Factors/Patient Goals Review:    Core Components/Risk Factors/Patient Goals at Discharge (Final Review):    ITP Comments: ITP Comments    Row Name 12/12/17 1226           ITP Comments  Med Review completed. Initial ITP created. [facility]          Comments: 30 day review completed. ITP sent to Dr. MaEmily FilbertMedical Director of Cardiac Rehab. Continue with ITP unless changes are made by physician

## 2017-12-21 NOTE — Progress Notes (Signed)
Cardiac Individual Treatment Plan  Patient Details  Name: Christopher [name] MRN: [id number] Date of Birth: 1945-05-05 Referring Provider:     Cardiac Rehab from 12/12/2017 in [facility] Cardiac and Pulmonary Rehab  Referring Provider  Lujean Amel MD      Initial Encounter Date:    Cardiac Rehab from 12/12/2017 in [facility] Cardiac and Pulmonary Rehab  Date  12/12/17      Visit Diagnosis: Heart failure, chronic systolic ([place])  Patient's Home Medications on Admission:  Current Outpatient Medications:  .  amLODipine (NORVASC) 5 MG tablet, TAKE 1 TABLET(5 MG) BY MOUTH DAILY, Disp: 90 tablet, Rfl: 0 .  amoxicillin-clavulanate (AUGMENTIN) 875-125 MG tablet, Take 1 tablet by mouth 2 (two) times daily., Disp: 20 tablet, Rfl: 0 .  Cholecalciferol (VITAMIN D) 2000 units tablet, Take by mouth., Disp: , Rfl:  .  clopidogrel (PLAVIX) 75 MG tablet, TAKE 1 TABLET BY MOUTH EVERY DAY AT 6:00AM, Disp: 90 tablet, Rfl: 1 .  clopidogrel (PLAVIX) 75 MG tablet, TAKE 1 TABLET BY MOUTH EVERY DAY AT 6 AM, Disp: 90 tablet, Rfl: 0 .  diclofenac sodium (VOLTAREN) 1 % GEL, Apply 4 g topically 4 (four) times daily. (Patient taking differently: Apply 4 g topically 4 (four) times daily. Dr Marry Guan), Disp: 1 Tube, Rfl: 1 .  etodolac (LODINE) 500 MG tablet, TAKE 1 TABLET BY MOUTH TWICE DAILY, Disp: 60 tablet, Rfl: 0 .  fenofibrate 160 MG tablet, Take 1 tablet (160 mg total) by mouth daily., Disp: 90 tablet, Rfl: 1 .  guaiFENesin-codeine (ROBITUSSIN AC) 100-10 MG/5ML syrup, Take 5 mLs by mouth 3 (three) times daily as needed for cough., Disp: 150 mL, Rfl: 0 .  metoprolol succinate (TOPROL-XL) 100 MG 24 hr tablet, TAKE 1 TABLET BY MOUTH EVERY DAY IMMEDIATELY FOLLOWING A MEAL, Disp: 90 tablet, Rfl: 0 .  niacin 500 MG CR capsule, Take 1 capsule (500 mg total) by mouth daily at 6 (six) AM., Disp: 90 capsule, Rfl: 1 .  ramipril (ALTACE) 10 MG capsule, TAKE 1 CAPSULE(10 MG) BY MOUTH DAILY, Disp: 90 capsule, Rfl: 0 .  ramipril  (ALTACE) 10 MG capsule, TAKE ONE CAPSULE BY MOUTH DAILY, Disp: 90 capsule, Rfl: 0 .  simvastatin (ZOCOR) 40 MG tablet, TAKE 1 TABLET BY MOUTH EVERY DAY AT 6 AM, Disp: 90 tablet, Rfl: 0 .  torsemide (DEMADEX) 10 MG tablet, Take 1 tablet by mouth daily. Dr Clayborn Bigness, Disp: , Rfl:   Past Medical History: Past Medical History:  Diagnosis Date  . Hyperlipidemia   . Hypertension   . 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   Exercise Prescription Goal: Initial exercise prescription builds to 30-45 minutes a day of aerobic activity, 2-3 days per week.  Home exercise guidelines will be given to patient during program as part of exercise prescription that the participant  will acknowledge.  Activity Barriers & Risk Stratification: Activity Barriers & Cardiac Risk Stratification - 12/12/17 1236      Activity Barriers & Cardiac Risk Stratification   Activity Barriers  Arthritis;Right Knee Replacement;Joint Problems;Shortness of Breath;Decreased Ventricular Function;Muscular Weakness;Deconditioning;Balance Concerns   needs left knee replacement   Cardiac Risk Stratification  High       6 Minute Walk: 6 Minute Walk    Row Name 12/12/17 1235         6 Minute Walk   Phase  Initial     Distance  694 feet     Walk Time  4.37 minutes     # of Rest Breaks  3 rest for knee pain: 27 sec, 38 sec, 33 sec     MPH   1.8     METS  1.94     RPE  17     Perceived Dyspnea   1     VO2 Peak  6.81     Symptoms  Yes (comment)     Comments  L knee pain 8/10 (made gait a little wobbly), SOB     Resting HR  99 bpm     Resting BP  132/64     Resting Oxygen Saturation   99 %     Exercise Oxygen Saturation  during 6 min walk  97 %     Max Ex. HR  156 bpm     Max Ex. BP  166/72     2 Minute Post BP  144/66        Oxygen Initial Assessment:   Oxygen Re-Evaluation:   Oxygen Discharge (Final Oxygen Re-Evaluation):   Initial Exercise Prescription: Initial Exercise Prescription - 12/12/17 1200      Date of Initial Exercise RX and Referring Provider   Date  12/12/17    Referring Provider  Lujean Amel MD      Treadmill   MPH  1.2    Grade  0.5    Minutes  15    METs  2      NuStep   Level  1    SPM  80    Minutes  15    METs  2      REL-XR   Level  1    Speed  50    Minutes  15    METs  2      Prescription Details   Frequency (times per week)  3    Duration  Progress to 45 minutes of aerobic exercise without signs/symptoms of physical distress      Intensity   THRR 40-80% of Max Heartrate  119-140    Ratings of Perceived Exertion  11-13    Perceived Dyspnea  0-4      Progression   Progression  Continue to progress workloads to maintain intensity without signs/symptoms of physical distress.      Resistance Training   Training Prescription  Yes    Weight  3 lbs    Reps  10-15       Perform Capillary Blood Glucose checks as needed.  Exercise Prescription Changes: Exercise Prescription Changes    Row Name 12/12/17 1200             Response to Exercise   Blood Pressure (Admit)  132/64       Blood Pressure (Exercise)  166/72       Blood Pressure (Exit)  144/66       Heart Rate (Admit)  99 bpm       Heart Rate (Exercise)  156 bpm       Heart Rate (Exit)  98 bpm       Oxygen Saturation (Admit)  99 %       Oxygen Saturation (Exercise)  97 %       Rating of Perceived  Exertion (Exercise)  17       Perceived Dyspnea (Exercise)  1       Symptoms  knee pain 8/10, SOB       Comments  walk test results          Exercise Comments: Exercise Comments    Row Name 12/19/17 0742           Exercise Comments  First full day of exercise!  Patient was oriented to gym and equipment including functions, settings, policies, and procedures.  Patient's individual exercise prescription and treatment plan were reviewed.  All starting workloads were established based on the results of the 6 minute walk test done at initial orientation visit.  The plan for exercise progression was also introduced and progression will be customized based on patient's performance and goals.          Exercise Goals and Review: Exercise Goals    Row Name 12/12/17 1240             Exercise Goals   Increase Physical Activity  Yes       Intervention  Provide advice, education, support and counseling about physical activity/exercise needs.;Develop an individualized exercise prescription for aerobic and resistive training based on initial evaluation findings, risk stratification, comorbidities and participant's personal goals.       Expected Outcomes  Short Term: Attend rehab on a regular basis to increase amount of physical activity.;Long Term: Add in home exercise to make exercise part of routine and to increase amount of physical activity.;Long Term: Exercising regularly at least 3-5 days a week.       Increase Strength and Stamina  Yes       Intervention  Provide advice, education, support and counseling about physical activity/exercise needs.;Develop an individualized exercise prescription for aerobic and resistive training based on initial evaluation findings, risk stratification, comorbidities and participant's personal goals.       Expected Outcomes  Short Term: Increase workloads from initial exercise prescription for resistance, speed, and METs.;Long Term: Improve cardiorespiratory  fitness, muscular endurance and strength as measured by increased METs and functional capacity (6MWT);Short Term: Perform resistance training exercises routinely during rehab and add in resistance training at home       Able to understand and use rate of perceived exertion (RPE) scale  Yes       Intervention  Provide education and explanation on how to use RPE scale       Expected Outcomes  Short Term: Able to use RPE daily in rehab to express subjective intensity level;Long Term:  Able to use RPE to guide intensity level when exercising independently       Able to understand and use Dyspnea scale  Yes       Intervention  Provide education and explanation on how to use Dyspnea scale       Expected Outcomes  Short Term: Able to use Dyspnea scale daily in rehab to express subjective sense of shortness of breath during exertion;Long Term: Able to use Dyspnea scale to guide intensity level when exercising independently       Knowledge and understanding of Target Heart Rate  Range (THRR)  Yes       Intervention  Provide education and explanation of THRR including how the numbers were predicted and where they are located for reference       Expected Outcomes  Short Term: Able to state/look up THRR;Long Term: Able to use THRR to govern intensity when exercising independently;Short Term: Able to use daily as guideline for intensity in rehab       Able to check pulse independently  Yes       Intervention  Provide education and demonstration on how to check pulse in carotid and radial arteries.;Review the importance of being able to check your own pulse for safety during independent exercise       Expected Outcomes  Short Term: Able to explain why pulse checking is important during independent exercise;Long Term: Able to check pulse independently and accurately       Understanding of Exercise Prescription  Yes       Intervention  Provide education, explanation, and written materials on patient's individual  exercise prescription       Expected Outcomes  Short Term: Able to explain program exercise prescription;Long Term: Able to explain home exercise prescription to exercise independently          Exercise Goals Re-Evaluation : Exercise Goals Re-Evaluation    Row Name 12/19/17 0742             Exercise Goal Re-Evaluation   Exercise Goals Review  Increase Physical Activity;Increase Strength and Stamina;Able to understand and use rate of perceived exertion (RPE) scale;Knowledge and understanding of Target Heart Rate Range (THRR);Understanding of Exercise Prescription       Comments  Reviewed RPE scale, THR and program prescription with pt today.  Pt voiced understanding and was given a copy of goals to take home.        Expected Outcomes  Short: Use RPE daily to regulate intensity. Long: Follow program prescription in THR.          Discharge Exercise Prescription (Final Exercise Prescription Changes): Exercise Prescription Changes - 12/12/17 1200      Response to Exercise   Blood Pressure (Admit)  132/64    Blood Pressure (Exercise)  166/72    Blood Pressure (Exit)  144/66    Heart Rate (Admit)  99 bpm    Heart Rate (Exercise)  156 bpm    Heart Rate (Exit)  98 bpm    Oxygen Saturation (Admit)  99 %    Oxygen Saturation (Exercise)  97 %    Rating of Perceived Exertion (Exercise)  17    Perceived Dyspnea (Exercise)  1    Symptoms  knee pain 8/10, SOB    Comments  walk test results       Nutrition:  Target Goals: Understanding of nutrition guidelines, daily intake of sodium '1500mg'$ , cholesterol '200mg'$ , calories 30% from fat and 7% or less from saturated fats, daily to have 5 or more servings of fruits and vegetables.  Biometrics: Pre Biometrics - 12/12/17 1241      Pre Biometrics   Height  6' 2.5" (1.892 m)    Weight  (!) 322 lb 11.2 oz (146.4 kg)    Waist Circumference  51 inches    Hip Circumference  47 inches    Waist to Hip Ratio  1.09 %    BMI (Calculated)  40.89     Single Leg Stand  2.94 seconds        Nutrition Therapy Plan and Nutrition Goals:  Nutrition Therapy & Goals - 12/12/17 1235      Intervention Plan   Intervention  Prescribe, educate and counsel regarding individualized specific dietary modifications aiming towards targeted core components such as weight, hypertension, lipid management, diabetes, heart failure and other comorbidities.;Nutrition handout(s) given to patient.    Expected Outcomes  Short Term Goal: Understand basic principles of dietary content, such as calories, fat, sodium, cholesterol and nutrients.;Short Term Goal: A plan has been developed with personal nutrition goals set during dietitian appointment.;Long Term Goal: Adherence to prescribed nutrition plan.       Nutrition Assessments: Nutrition Assessments - 12/12/17 1057      MEDFICTS Scores   Pre Score  40       Nutrition Goals Re-Evaluation:   Nutrition Goals Discharge (Final Nutrition Goals Re-Evaluation):   Psychosocial: Target Goals: Acknowledge presence or absence of significant depression and/or stress, maximize coping skills, provide positive support system. Participant is able to verbalize types and ability to use techniques and skills needed for reducing stress and depression.   Initial Review & Psychosocial Screening: Initial Psych Review & Screening - 12/12/17 1232      Initial Review   Current issues with  Current Stress Concerns    Source of Stress Concerns  Unable to perform yard/household activities    Comments  Christopher [name] has had a right knee replacement and needs his left one done. This causes some discomfort while doing daily activities. 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 He says he gets around pretty well if he has a cart, but wants to get stronger doing other things around the house and hobbies. His son, daughter-in-law, and granddaughter (51years old) moved in with him ever since his wife passed away a few years  ago. He loves having his family around. He is starting to notice a good difference since being put on fluid pills and is motivated to work hard  so he can get his other knee replaced.        Family Dynamics   Good Support System?  Yes   son, daughter in law, daughter     Barriers   Psychosocial barriers to participate in program  There are no identifiable barriers or psychosocial needs.;The patient should benefit from training in stress management and relaxation.      Screening Interventions   Interventions  Encouraged to exercise;Program counselor consult;To provide support and resources with identified psychosocial needs;Provide feedback about the scores to participant    Expected Outcomes  Short Term goal: Utilizing psychosocial counselor, staff and physician to assist with identification of specific Stressors or current issues interfering with healing process. Setting desired goal for each stressor or current issue identified.;Long Term Goal: Stressors or current issues are controlled or eliminated.;Long Term goal: The participant improves quality of Life and PHQ9 Scores as seen by post scores and/or verbalization of changes;Short Term goal: Identification and review with participant of any Quality of Life or Depression concerns found by scoring the questionnaire.       Quality of Life Scores:  Quality of Life - 12/12/17 1059      Quality of Life   Select  Quality of Life      Quality of Life Scores   Health/Function Pre  18.67 %    Socioeconomic Pre  25.21 %    Psych/Spiritual Pre  26.57 %    Family Pre  28.5 %    GLOBAL Pre  22.92 %  Scores of 19 and below usually indicate a poorer quality of life in these areas.  A difference of  2-3 points is a clinically meaningful difference.  A difference of 2-3 points in the total score of the Quality of Life Index has been associated with significant improvement in overall quality of life, self-image, physical symptoms, and general  health in studies assessing change in quality of life.  PHQ-9: Recent Review Flowsheet Data    Depression screen [facility] 2/9 12/12/2017 05/20/2017 12/16/2016 08/19/2015 10/22/2014   Decreased Interest 0 0 0 0 0   Down, Depressed, Hopeless 0 0 0 0 0   PHQ - 2 Score 0 0 0 0 0   Altered sleeping 0 0 0 - -   Tired, decreased energy '1 1 2  '$ - -   Change in appetite 0 0 0 - -   Feeling bad or failure about yourself  0 0 0 - -   Trouble concentrating 0 0 0 - -   Moving slowly or fidgety/restless 0 0 0 - -   Suicidal thoughts 0 0 0 - -   PHQ-9 Score '1 1 2 '$ - -   Difficult doing work/chores Not difficult at all Not difficult at all Not difficult at all - -     Interpretation of Total Score  Total Score Depression Severity:  1-4 = Minimal depression, 5-9 = Mild depression, 10-14 = Moderate depression, 15-19 = Moderately severe depression, 20-27 = Severe depression   Psychosocial Evaluation and Intervention: Psychosocial Evaluation - 12/19/17 0954      Psychosocial Evaluation & Interventions   Interventions  Encouraged to exercise with the program and follow exercise prescription    Comments  Counselor met with Christopher [name]) today for initial psychosocial evaluation.  He has had some cardiac issues and was recommended to attend this program by his Dr.  Barnabas [name] also has some chronic knee problems and is awaiting his second knee to be replaced following completion of this program.  He has a strong support system with a son and his family who live in the same home with him and a daughter and sister locally.  He is also actively involved in his local church.  Christopher [name] reports sleeping well; eating well and denies a history or current symptoms of depression or anxiety.  He reports his mood being typically positive.  Christopher [name] states his primary stress is his health at this time.  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  Christopher [name] has goals to improve his health with his  heart; exercise more consistently and lose some weight while in this program.  Staff wil follow with him.     Expected Outcomes  Short:  Christopher [name] will meet with the dietician to address his weight loss goals.  He will exercise consistently with the CR program treatment plan to improve his heart health and develop a routine.  Long:  Christopher [name] will exercise for his overall health and be strong enough to have his knee replacement surgery.      Continue Psychosocial Services   Follow up required by staff       Psychosocial Re-Evaluation:   Psychosocial Discharge (Final Psychosocial Re-Evaluation):   Vocational Rehabilitation: Provide vocational rehab assistance to qualifying candidates.   Vocational Rehab Evaluation & Intervention: Vocational Rehab - 12/12/17 1232      Initial Vocational Rehab Evaluation & Intervention   Assessment shows need for Vocational Rehabilitation  No       Education: Education Goals: Education classes will be provided on a variety of topics geared toward better understanding of heart health and risk factor modification. Participant will state understanding/return demonstration of topics presented as noted by education test scores.  Learning Barriers/Preferences: Learning Barriers/Preferences - 12/12/17 1232      Learning Barriers/Preferences   Learning Barriers  Exercise Concerns    Learning Preferences  Written Material       Education Topics:  AED/CPR: - Group verbal and written instruction with the use of models to demonstrate the basic use of the AED with the basic ABC's of resuscitation.   General Nutrition Guidelines/Fats and Fiber: -Group instruction provided by verbal, written material, models and posters to present the general guidelines for heart healthy nutrition. Gives an explanation and review of dietary fats and fiber.   Controlling Sodium/Reading Food Labels: -Group verbal and written material supporting the discussion of sodium use in heart  healthy nutrition. Review and explanation with models, verbal and written materials for utilization of the food label.   Exercise Physiology & General Exercise Guidelines: - Group verbal and written instruction with models to review the exercise physiology of the cardiovascular system and associated critical values. Provides general exercise guidelines with specific guidelines to those with heart or lung disease.    Aerobic Exercise & Resistance Training: - Gives group verbal and written instruction on the various components of exercise. Focuses on aerobic and resistive training programs and the benefits of this training and how to safely progress through these programs..   Flexibility, Balance, Mind/Body Relaxation: Provides group verbal/written instruction on the benefits of flexibility and balance training, including mind/body exercise modes such as yoga, pilates and tai chi.  Demonstration and skill practice provided.   Cardiac Rehab from 12/19/2017 in [facility] Cardiac and Pulmonary Rehab  Date  12/19/17  Educator  [facility]  Instruction Review Code  1- Verbalizes Understanding      Stress and Anxiety: - Provides group verbal and written instruction about the health risks of elevated stress and causes of high stress.  Discuss the correlation between heart/lung disease and anxiety and treatment options. Review healthy ways to manage with stress and anxiety.   Depression: - Provides group verbal and written instruction on the correlation between heart/lung disease and depressed mood, treatment options, and the stigmas associated with seeking treatment.   Anatomy & Physiology of the Heart: - Group verbal and written instruction and models provide basic cardiac anatomy and physiology, with the coronary electrical and arterial systems. Review of Valvular disease and Heart Failure   Cardiac Procedures: - Group verbal and written instruction to review commonly prescribed medications for heart  disease. Reviews the medication, class of the drug, and side effects. Includes the steps to properly store meds and maintain the prescription regimen. (beta blockers and nitrates)   Cardiac Medications I: - Group verbal and written instruction to review commonly prescribed medications for heart disease. Reviews the medication, class of the drug, and side effects. Includes the steps to properly store meds and maintain the prescription regimen.   Cardiac Medications II: -Group verbal and written instruction to review commonly prescribed medications for heart disease. Reviews the medication, class of the drug, and side effects. (all other drug classes)    Go Sex-Intimacy & Heart Disease, Get SMART - Goal Setting: - Group verbal and written instruction through game format to discuss heart disease and the return to sexual intimacy. Provides group verbal and written material to  discuss and apply goal setting through the application of the S.M.A.R.T. Method.   Other Matters of the Heart: - Provides group verbal, written materials and models to describe Stable Angina and Peripheral Artery. Includes description of the disease process and treatment options available to the cardiac patient.   Exercise & Equipment Safety: - Individual verbal instruction and demonstration of equipment use and safety with use of the equipment.   Cardiac Rehab from 12/19/2017 in [facility] Cardiac and Pulmonary Rehab  Date  12/12/17  Educator  [facility]  Instruction Review Code  1- Verbalizes Understanding      Infection Prevention: - Provides verbal and written material to individual with discussion of infection control including proper hand washing and proper equipment cleaning during exercise session.   Cardiac Rehab from 12/19/2017 in [facility] Cardiac and Pulmonary Rehab  Date  12/12/17  Educator  [facility]  Instruction Review Code  1- Verbalizes Understanding      Falls Prevention: - Provides verbal and written material to  individual with discussion of falls prevention and safety.   Cardiac Rehab from 12/19/2017 in [facility] Cardiac and Pulmonary Rehab  Date  12/12/17  Educator  [facility]  Instruction Review Code  1- Verbalizes Understanding      Diabetes: - Individual verbal and written instruction to review signs/symptoms of diabetes, desired ranges of glucose level fasting, after meals and with exercise. Acknowledge that pre and post exercise glucose checks will be done for 3 sessions at entry of program.   Know Your Numbers and Risk Factors: -Group verbal and written instruction about important numbers in your health.  Discussion of what are risk factors and how they play a role in the disease process.  Review of Cholesterol, Blood Pressure, Diabetes, and BMI and the role they play in your overall health.   Sleep Hygiene: -Provides group verbal and written instruction about how sleep can affect your health.  Define sleep hygiene, discuss sleep cycles and impact of sleep habits. Review good sleep hygiene tips.    Other: -Provides group and verbal instruction on various topics (see comments)   Knowledge Questionnaire Score: Knowledge Questionnaire Score - 12/12/17 1057      Knowledge Questionnaire Score   Pre Score  21/26   test reviewed with pt today      Core Components/Risk Factors/Patient Goals at Admission: Personal Goals and Risk Factors at Admission - 12/12/17 1230      Core Components/Risk Factors/Patient Goals on Admission    Weight Management  Yes;Obesity;Weight Loss    Intervention  Weight Management: Develop a combined nutrition and exercise program designed to reach desired caloric intake, while maintaining appropriate intake of nutrient and fiber, sodium and fats, and appropriate energy expenditure required for the weight goal.;Weight Management: Provide education and appropriate resources to help participant work on and attain dietary goals.;Weight Management/Obesity: Establish reasonable  short term and long term weight goals.;Obesity: Provide education and appropriate resources to help participant work on and attain dietary goals.    Admit Weight  322 lb 11.2 oz (146.4 kg)    Goal Weight: Short Term  317 lb (143.8 kg)    Goal Weight: Long Term  275 lb (124.7 kg)    Expected Outcomes  Short Term: Continue to assess and modify interventions until short term weight is achieved;Long Term: Adherence to nutrition and physical activity/exercise program aimed toward attainment of established weight goal;Weight Loss: Understanding of general recommendations for a balanced deficit meal plan, which promotes 1-2 lb weight loss per week and includes  a negative energy balance of [phone] kcal/d;Understanding recommendations for meals to include 15-35% energy as protein, 25-35% energy from fat, 35-60% energy from carbohydrates, less than '200mg'$  of dietary cholesterol, 20-35 gm of total fiber daily;Understanding of distribution of calorie intake throughout the day with the consumption of 4-5 meals/snacks    Heart Failure  Yes    Intervention  Provide a combined exercise and nutrition program that is supplemented with education, support and counseling about heart failure. Directed toward relieving symptoms such as shortness of breath, decreased exercise tolerance, and extremity edema.    Expected Outcomes  Improve functional capacity of life;Short term: Attendance in program 2-3 days a week with increased exercise capacity. Reported lower sodium intake. Reported increased fruit and vegetable intake. Reports medication compliance.;Short term: Daily weights obtained and reported for increase. Utilizing diuretic protocols set by physician.;Long term: Adoption of self-care skills and reduction of barriers for early signs and symptoms recognition and intervention leading to self-care maintenance.    Hypertension  Yes    Intervention  Monitor prescription use compliance.;Provide education on lifestyle modifcations  including regular physical activity/exercise, weight management, moderate sodium restriction and increased consumption of fresh fruit, vegetables, and low fat dairy, alcohol moderation, and smoking cessation.    Expected Outcomes  Short Term: Continued assessment and intervention until BP is < 140/77m HG in hypertensive participants. < 130/817mHG in hypertensive participants with diabetes, heart failure or chronic kidney disease.;Long Term: Maintenance of blood pressure at goal levels.    Lipids  Yes    Intervention  Provide education and support for participant on nutrition & aerobic/resistive exercise along with prescribed medications to achieve LDL '70mg'$ , HDL >'40mg'$ .    Expected Outcomes  Short Term: Participant states understanding of desired cholesterol values and is compliant with medications prescribed. Participant is following exercise prescription and nutrition guidelines.;Long Term: Cholesterol controlled with medications as prescribed, with individualized exercise RX and with personalized nutrition plan. Value goals: LDL < '70mg'$ , HDL > 40 mg.       Core Components/Risk Factors/Patient Goals Review:    Core Components/Risk Factors/Patient Goals at Discharge (Final Review):    ITP Comments: ITP Comments    Row Name 12/12/17 1226 12/21/17 0554         ITP Comments  Med Review completed. Initial ITP created. [facility] review completed. ITP sent to Dr. MaEmily FilbertMedical Director of Cardiac Rehab. Continue with ITP unless changes are made by physician         Comments:

## 2017-12-23 ENCOUNTER — Encounter: Payer: Medicare Other | Admitting: *Deleted

## 2017-12-23 DIAGNOSIS — I5022 Chronic systolic (congestive) heart failure: Secondary | ICD-10-CM | POA: Diagnosis not present

## 2017-12-23 NOTE — Progress Notes (Signed)
Daily Session Note  Patient Details  Name: Christopher [name] MRN: [id number] Date of Birth: Nov 13, 1945 Referring Provider:     Cardiac Rehab from 12/12/2017 in [facility] Cardiac and Pulmonary Rehab  Referring Provider  Lujean Amel MD      Encounter Date: 12/23/2017  Check In: Session Check In - 12/23/17 0727      Check-In   Supervising physician immediately available to respond to emergencies  See telemetry face sheet for immediately available ER MD    Location  [facility]-Cardiac & Pulmonary Rehab    Staff Present  Renita Papa, RN BSN;Jessica Luan Pulling, MA, RCEP, CCRP, Exercise Physiologist;Amanda Oletta Darter, [place], ACSM CEP, Exercise Physiologist    Medication changes reported      No    Fall or balance concerns reported     No    Warm-up and Cool-down  Performed on first and last piece of equipment    Resistance Training Performed  Yes    VAD Patient?  No    PAD/SET Patient?  No      Pain Assessment   Currently in Pain?  No/denies          Social History   Tobacco Use  Smoking Status Former Smoker  Smokeless Tobacco Never Used    Goals Met:  Exercise tolerated well No report of cardiac concerns or symptoms Strength training completed today  Goals Unmet:  Not Applicable  Comments: Pt able to follow exercise prescription today without complaint.  Will continue to monitor for progression.  Reviewed home exercise with pt today.  Pt plans to walk and join gym for exercise.  He is trying to decide between [facility] and [employer].  Reviewed THR, pulse, RPE, sign and symptoms, and when to call 911 or MD.  Also discussed weather considerations and indoor options.  Pt voiced understanding.   Dr. Emily Filbert is Medical Director for [place] and [facility].

## 2017-12-26 ENCOUNTER — Other Ambulatory Visit: Payer: Self-pay | Admitting: Family Medicine

## 2017-12-26 ENCOUNTER — Encounter: Payer: Medicare Other | Admitting: *Deleted

## 2017-12-26 DIAGNOSIS — I5022 Chronic systolic (congestive) heart failure: Secondary | ICD-10-CM

## 2017-12-26 DIAGNOSIS — I10 Essential (primary) hypertension: Secondary | ICD-10-CM

## 2017-12-26 NOTE — Progress Notes (Signed)
Daily Session Note  Patient Details  Name: Christopher [name] MRN: [id number] Date of Birth: 01-10-1946 Referring Provider:     Cardiac Rehab from 12/12/2017 in [facility] Cardiac and Pulmonary Rehab  Referring Provider  Lujean Amel MD      Encounter Date: 12/26/2017  Check In: Session Check In - 12/26/17 0748      Check-In   Supervising physician immediately available to respond to emergencies  See telemetry face sheet for immediately available ER MD    Location  [facility]-Cardiac & Pulmonary Rehab    Staff Present  Alberteen Sam, MA, RCEP, CCRP, Exercise Physiologist;Susanne Bice, RN, BSN, CCRP;Kelly Hayes, BS, ACSM CEP, Exercise Physiologist    Medication changes reported      No    Fall or balance concerns reported     No    Warm-up and Cool-down  Performed on first and last piece of equipment    Resistance Training Performed  Yes    VAD Patient?  No    PAD/SET Patient?  No      Pain Assessment   Currently in Pain?  No/denies          Social History   Tobacco Use  Smoking Status Former Smoker  Smokeless Tobacco Never Used    Goals Met:  Independence with exercise equipment Exercise tolerated well No report of cardiac concerns or symptoms Strength training completed today  Goals Unmet:  Not Applicable  Comments: Pt able to follow exercise prescription today without complaint.  Will continue to monitor for progression.    Dr. Emily Filbert is Medical Director for [place] and [facility].

## 2017-12-28 DIAGNOSIS — I5022 Chronic systolic (congestive) heart failure: Secondary | ICD-10-CM

## 2017-12-28 NOTE — Progress Notes (Signed)
Daily Session Note  Patient Details  Name: Christopher [name] MRN: [id number] Date of Birth: 11/25/1945 Referring Provider:     Cardiac Rehab from 12/12/2017 in [facility] Cardiac and Pulmonary Rehab  Referring Provider  Callwood, Dwayne MD      Encounter Date: 12/28/2017  Check In: Session Check In - 12/28/17 0728      Check-In   Supervising physician immediately available to respond to emergencies  See telemetry face sheet for immediately available ER MD    Location  [facility]-Cardiac & Pulmonary Rehab    Staff Present  Jessica Hawkins, MA, RCEP, CCRP, Exercise Physiologist;Joseph Hood RCP,RRT,BSRT;Susanne Bice, RN, BSN, CCRP    Medication changes reported      No    Fall or balance concerns reported     No    Warm-up and Cool-down  Performed on first and last piece of equipment    Resistance Training Performed  Yes    VAD Patient?  No    PAD/SET Patient?  No      Pain Assessment   Currently in Pain?  No/denies          Social History   Tobacco Use  Smoking Status Former Smoker  Smokeless Tobacco Never Used    Goals Met:  Independence with exercise equipment Exercise tolerated well No report of cardiac concerns or symptoms Strength training completed today  Goals Unmet:  Not Applicable  Comments: Pt able to follow exercise prescription today without complaint.  Will continue to monitor for progression.    Dr. Mark Miller is Medical Director for [facility] and [facility]. 

## 2017-12-30 ENCOUNTER — Encounter: Payer: Medicare Other | Admitting: *Deleted

## 2017-12-30 DIAGNOSIS — I5022 Chronic systolic (congestive) heart failure: Secondary | ICD-10-CM | POA: Diagnosis not present

## 2017-12-30 NOTE — Progress Notes (Signed)
Daily Session Note  Patient Details  Name: Christopher [name] MRN: [id number] Date of Birth: 1945/10/29 Referring Provider:     Cardiac Rehab from 12/12/2017 in [facility] Cardiac and Pulmonary Rehab  Referring Provider  Lujean Amel MD      Encounter Date: 12/30/2017  Check In: Session Check In - 12/30/17 0923      Check-In   Supervising physician immediately available to respond to emergencies  See telemetry face sheet for immediately available ER MD    Location  [facility]-Cardiac & Pulmonary Rehab    Staff Present  Renita Papa, RN Vickki Hearing, BA, ACSM CEP, Exercise Physiologist;Krista Frederico Hamman, RN BSN    Medication changes reported      No    Fall or balance concerns reported     No    Warm-up and Cool-down  Performed on first and last piece of equipment    Resistance Training Performed  Yes    VAD Patient?  No    PAD/SET Patient?  No      Pain Assessment   Currently in Pain?  No/denies          Social History   Tobacco Use  Smoking Status Former Smoker  Smokeless Tobacco Never Used    Goals Met:  Independence with exercise equipment Exercise tolerated well No report of cardiac concerns or symptoms Strength training completed today  Goals Unmet:  Not Applicable  Comments: Pt able to follow exercise prescription today without complaint.  Will continue to monitor for progression.    Dr. Emily Filbert is Medical Director for [place] and [facility].

## 2018-01-02 ENCOUNTER — Encounter: Payer: Medicare Other | Admitting: *Deleted

## 2018-01-02 DIAGNOSIS — I5022 Chronic systolic (congestive) heart failure: Secondary | ICD-10-CM

## 2018-01-02 NOTE — Progress Notes (Signed)
Daily Session Note  Patient Details  Name: Christopher [name] MRN: [id number] Date of Birth: 10/18/1945 Referring Provider:     Cardiac Rehab from 12/12/2017 in [facility] Cardiac and Pulmonary Rehab  Referring Provider  Lujean Amel MD      Encounter Date: 01/02/2018  Check In: Session Check In - 01/02/18 0748      Check-In   Supervising physician immediately available to respond to emergencies  See telemetry face sheet for immediately available ER MD    Location  [facility]-Cardiac & Pulmonary Rehab    Staff Present  Alberteen Sam, MA, RCEP, CCRP, Exercise Physiologist;Chaysen Tillman Amedeo Plenty, BS, ACSM CEP, Exercise Physiologist;Susanne Bice, RN, BSN, CCRP    Medication changes reported      No    Fall or balance concerns reported     No    Tobacco Cessation  No Change    Warm-up and Cool-down  Performed on first and last piece of equipment    Resistance Training Performed  Yes    VAD Patient?  No    PAD/SET Patient?  No      Pain Assessment   Currently in Pain?  No/denies    Multiple Pain Sites  No          Social History   Tobacco Use  Smoking Status Former Smoker  Smokeless Tobacco Never Used    Goals Met:  Independence with exercise equipment Exercise tolerated well No report of cardiac concerns or symptoms Strength training completed today  Personal goals reviewed  Goals Unmet:  Not Applicable  Comments: Pt able to follow exercise prescription today without complaint.  Will continue to monitor for progression.    Dr. Emily Filbert is Medical Director for [place] and [facility].

## 2018-01-04 DIAGNOSIS — I5022 Chronic systolic (congestive) heart failure: Secondary | ICD-10-CM | POA: Diagnosis not present

## 2018-01-04 NOTE — Progress Notes (Signed)
Daily Session Note  Patient Details  Name: Christopher [name] MRN: [id number] Date of Birth: February 13, 1946 Referring Provider:     Cardiac Rehab from 12/12/2017 in [facility] Cardiac and Pulmonary Rehab  Referring Provider  Lujean Amel MD      Encounter Date: 01/04/2018  Check In: Session Check In - 01/04/18 0746      Check-In   Supervising physician immediately available to respond to emergencies  See telemetry face sheet for immediately available ER MD    Location  [facility]-Cardiac & Pulmonary Rehab    Staff Present  Justin Mend Lorre Nick, MA, RCEP, CCRP, Exercise Physiologist;Susanne Bice, RN, BSN, CCRP    Medication changes reported      No    Fall or balance concerns reported     No    Warm-up and Cool-down  Performed on first and last piece of equipment    Resistance Training Performed  Yes    VAD Patient?  No    PAD/SET Patient?  No      Pain Assessment   Currently in Pain?  No/denies          Social History   Tobacco Use  Smoking Status Former Smoker  Smokeless Tobacco Never Used    Goals Met:  Independence with exercise equipment Exercise tolerated well No report of cardiac concerns or symptoms Strength training completed today  Goals Unmet:  Not Applicable  Comments: Pt able to follow exercise prescription today without complaint.  Will continue to monitor for progression.    Dr. Emily Filbert is Medical Director for [place] and [facility].

## 2018-01-06 ENCOUNTER — Encounter: Payer: Medicare Other | Admitting: *Deleted

## 2018-01-06 DIAGNOSIS — I5022 Chronic systolic (congestive) heart failure: Secondary | ICD-10-CM | POA: Diagnosis not present

## 2018-01-06 NOTE — Progress Notes (Signed)
Daily Session Note  Patient Details  Name: Christopher [name] MRN: [id number] Date of Birth: [birth year] Referring Provider:     Cardiac Rehab from 12/12/2017 in [facility] Cardiac and Pulmonary Rehab  Referring Provider  Lujean Amel MD      Encounter Date: 01/06/2018  Check In: Session Check In - 01/06/18 4497      Check-In   Supervising physician immediately available to respond to emergencies  See telemetry face sheet for immediately available ER MD    Location  [facility]-Cardiac & Pulmonary Rehab    Staff Present  Renita Papa, RN BSN;Jessica Luan Pulling, MA, RCEP, CCRP, Exercise Physiologist;Johnothan Bascomb Psychologist, prison and probation services, BSN    Medication changes reported      No    Fall or balance concerns reported     No    Warm-up and Cool-down  Performed on first and last piece of equipment    Resistance Training Performed  Yes    VAD Patient?  No    PAD/SET Patient?  No      Pain Assessment   Currently in Pain?  No/denies          Social History   Tobacco Use  Smoking Status Former Smoker  Smokeless Tobacco Never Used    Goals Met:  Independence with exercise equipment Exercise tolerated well No report of cardiac concerns or symptoms Strength training completed today  Goals Unmet:  Not Applicable  Comments: Pt able to follow exercise prescription today without complaint.  Will continue to monitor for progression.    Dr. Emily Filbert is Medical Director for [place] and [facility].

## 2018-01-08 ENCOUNTER — Other Ambulatory Visit: Payer: Self-pay | Admitting: Family Medicine

## 2018-01-08 DIAGNOSIS — I10 Essential (primary) hypertension: Secondary | ICD-10-CM

## 2018-01-09 ENCOUNTER — Other Ambulatory Visit: Payer: Self-pay | Admitting: Family Medicine

## 2018-01-09 ENCOUNTER — Encounter: Payer: Medicare Other | Admitting: *Deleted

## 2018-01-09 DIAGNOSIS — I5022 Chronic systolic (congestive) heart failure: Secondary | ICD-10-CM | POA: Diagnosis not present

## 2018-01-09 NOTE — Progress Notes (Signed)
Daily Session Note  Patient Details  Name: Christopher [name] MRN: [id number] Date of Birth: 08/09/1945 Referring Provider:     Cardiac Rehab from 12/12/2017 in [facility] Cardiac and Pulmonary Rehab  Referring Provider  Lujean Amel MD      Encounter Date: 01/09/2018  Check In: Session Check In - 01/09/18 0752      Check-In   Supervising physician immediately available to respond to emergencies  See telemetry face sheet for immediately available ER MD    Location  [facility]-Cardiac & Pulmonary Rehab    Staff Present  Alberteen Sam, MA, RCEP, CCRP, Exercise Physiologist;Susanne Bice, RN, BSN, CCRP;Kelly Hayes, BS, ACSM CEP, Exercise Physiologist    Medication changes reported      No    Fall or balance concerns reported     No    Warm-up and Cool-down  Performed on first and last piece of equipment    Resistance Training Performed  Yes    VAD Patient?  No    PAD/SET Patient?  No      Pain Assessment   Currently in Pain?  No/denies          Social History   Tobacco Use  Smoking Status Former Smoker  Smokeless Tobacco Never Used    Goals Met:  Independence with exercise equipment Exercise tolerated well No report of cardiac concerns or symptoms Strength training completed today  Goals Unmet:  Not Applicable  Comments: Pt able to follow exercise prescription today without complaint.  Will continue to monitor for progression.    Dr. Emily Filbert is Medical Director for [place] and [facility].

## 2018-01-11 DIAGNOSIS — I5022 Chronic systolic (congestive) heart failure: Secondary | ICD-10-CM | POA: Diagnosis not present

## 2018-01-11 NOTE — Progress Notes (Signed)
Daily Session Note  Patient Details  Name: Christopher [name] MRN: [id number] Date of Birth: 04/03/1945 Referring Provider:     Cardiac Rehab from 12/12/2017 in [facility] Cardiac and Pulmonary Rehab  Referring Provider  Callwood, Dwayne MD      Encounter Date: 01/11/2018  Check In: Session Check In - 01/11/18 0733      Check-In   Supervising physician immediately available to respond to emergencies  See telemetry face sheet for immediately available ER MD    Location  [facility]-Cardiac & Pulmonary Rehab    Staff Present  Jessica Hawkins, MA, RCEP, CCRP, Exercise Physiologist;Joseph Hood RCP,RRT,BSRT;Susanne Bice, RN, BSN, CCRP    Medication changes reported      No    Fall or balance concerns reported     No    Warm-up and Cool-down  Performed on first and last piece of equipment    Resistance Training Performed  Yes    VAD Patient?  No    PAD/SET Patient?  No      Pain Assessment   Currently in Pain?  No/denies          Social History   Tobacco Use  Smoking Status Former Smoker  Smokeless Tobacco Never Used    Goals Met:  Independence with exercise equipment Exercise tolerated well No report of cardiac concerns or symptoms Strength training completed today  Goals Unmet:  Not Applicable  Comments: Pt able to follow exercise prescription today without complaint.  Will continue to monitor for progression.   Dr. Mark Miller is Medical Director for [facility] and [facility]. 

## 2018-01-13 ENCOUNTER — Encounter: Payer: Medicare Other | Attending: Internal Medicine

## 2018-01-13 DIAGNOSIS — I5022 Chronic systolic (congestive) heart failure: Secondary | ICD-10-CM | POA: Diagnosis not present

## 2018-01-13 NOTE — Progress Notes (Signed)
Daily Session Note  Patient Details  Name: Christopher [name] MRN: [id number] Date of Birth: 08/04/1945 Referring Provider:     Cardiac Rehab from 12/12/2017 in [facility] Cardiac and Pulmonary Rehab  Referring Provider  Callwood, Dwayne MD      Encounter Date: 01/13/2018  Check In:      Social History   Tobacco Use  Smoking Status Former Smoker  Smokeless Tobacco Never Used    Goals Met:  Independence with exercise equipment Exercise tolerated well No report of cardiac concerns or symptoms Strength training completed today  Goals Unmet:  Not Applicable  Comments: Pt able to follow exercise prescription today without complaint.  Will continue to monitor for progression.    Dr. Mark Miller is Medical Director for [facility] and [facility]. 

## 2018-01-16 ENCOUNTER — Encounter: Payer: Medicare Other | Admitting: *Deleted

## 2018-01-16 DIAGNOSIS — I5022 Chronic systolic (congestive) heart failure: Secondary | ICD-10-CM

## 2018-01-16 NOTE — Progress Notes (Signed)
Daily Session Note  Patient Details  Name: Christopher [name] MRN: [id number] Date of Birth: 06/21/1945 Referring Provider:     Cardiac Rehab from 12/12/2017 in [facility] Cardiac and Pulmonary Rehab  Referring Provider  Callwood, Dwayne MD      Encounter Date: 01/16/2018  Check In: Session Check In - 01/16/18 0900      Check-In   Supervising physician immediately available to respond to emergencies  See telemetry face sheet for immediately available ER MD    Location  [facility]-Cardiac & Pulmonary Rehab    Staff Present  Kelly Hayes, BS, ACSM CEP, Exercise Physiologist;Jessica Hawkins, MA, RCEP, CCRP, Exercise Physiologist;Susanne Bice, RN, BSN, CCRP    Medication changes reported      No    Fall or balance concerns reported     No    Tobacco Cessation  No Change    Warm-up and Cool-down  Performed on first and last piece of equipment    Resistance Training Performed  Yes    VAD Patient?  No    PAD/SET Patient?  No      Pain Assessment   Currently in Pain?  No/denies    Multiple Pain Sites  No          Social History   Tobacco Use  Smoking Status Former Smoker  Smokeless Tobacco Never Used    Goals Met:  Independence with exercise equipment Exercise tolerated well No report of cardiac concerns or symptoms Strength training completed today  Goals Unmet:  Not Applicable  Comments: Pt able to follow exercise prescription today without complaint.  Will continue to monitor for progression.    Dr. Mark Miller is Medical Director for [facility] and [facility]. 

## 2018-01-18 ENCOUNTER — Encounter: Payer: Self-pay | Admitting: *Deleted

## 2018-01-18 DIAGNOSIS — I5022 Chronic systolic (congestive) heart failure: Secondary | ICD-10-CM

## 2018-01-18 NOTE — Progress Notes (Signed)
Cardiac Individual Treatment Plan  Patient Details  Name: Christopher [name] MRN: [id number] Date of Birth: 1945-08-14 Referring Provider:     Cardiac Rehab from 12/12/2017 in [facility] Cardiac and Pulmonary Rehab  Referring Provider  Lujean Amel MD      Initial Encounter Date:    Cardiac Rehab from 12/12/2017 in [facility] Cardiac and Pulmonary Rehab  Date  12/12/17      Visit Diagnosis: Heart failure, chronic systolic ([place])  Patient's Home Medications on Admission:  Current Outpatient Medications:  .  amLODipine (NORVASC) 5 MG tablet, TAKE 1 TABLET(5 MG) BY MOUTH DAILY, Disp: 90 tablet, Rfl: 0 .  amoxicillin-clavulanate (AUGMENTIN) 875-125 MG tablet, Take 1 tablet by mouth 2 (two) times daily., Disp: 20 tablet, Rfl: 0 .  Cholecalciferol (VITAMIN D) 2000 units tablet, Take by mouth., Disp: , Rfl:  .  clopidogrel (PLAVIX) 75 MG tablet, TAKE 1 TABLET BY MOUTH EVERY DAY AT 6:00AM, Disp: 90 tablet, Rfl: 1 .  clopidogrel (PLAVIX) 75 MG tablet, TAKE 1 TABLET BY MOUTH EVERY DAY AT 6 AM, Disp: 90 tablet, Rfl: 0 .  diclofenac sodium (VOLTAREN) 1 % GEL, Apply 4 g topically 4 (four) times daily. (Patient taking differently: Apply 4 g topically 4 (four) times daily. Dr Marry Guan), Disp: 1 Tube, Rfl: 1 .  etodolac (LODINE) 500 MG tablet, TAKE 1 TABLET BY MOUTH TWICE DAILY, Disp: 180 tablet, Rfl: 0 .  fenofibrate 160 MG tablet, Take 1 tablet (160 mg total) by mouth daily., Disp: 90 tablet, Rfl: 1 .  fenofibrate 160 MG tablet, TAKE 1 TABLET BY MOUTH EVERY DAY, Disp: 90 tablet, Rfl: 0 .  guaiFENesin-codeine (ROBITUSSIN AC) 100-10 MG/5ML syrup, Take 5 mLs by mouth 3 (three) times daily as needed for cough., Disp: 150 mL, Rfl: 0 .  metoprolol succinate (TOPROL-XL) 100 MG 24 hr tablet, TAKE 1 TABLET BY MOUTH EVERY DAY IMMEDIATELY FOLLOWING A MEAL, Disp: 90 tablet, Rfl: 0 .  niacin 500 MG CR capsule, Take 1 capsule (500 mg total) by mouth daily at 6 (six) AM., Disp: 90 capsule, Rfl: 1 .  ramipril (ALTACE) 10 MG  capsule, TAKE 1 CAPSULE(10 MG) BY MOUTH DAILY, Disp: 90 capsule, Rfl: 0 .  ramipril (ALTACE) 10 MG capsule, TAKE ONE CAPSULE BY MOUTH DAILY, Disp: 90 capsule, Rfl: 0 .  simvastatin (ZOCOR) 40 MG tablet, TAKE 1 TABLET BY MOUTH EVERY DAY AT 6 AM, Disp: 90 tablet, Rfl: 0 .  torsemide (DEMADEX) 10 MG tablet, Take 1 tablet by mouth daily. Dr Clayborn Bigness, Disp: , Rfl:   Past Medical History: Past Medical History:  Diagnosis Date  . Hyperlipidemia   . Hypertension   . Myocardial infarction ([place])     Tobacco Use: Social History   Tobacco Use  Smoking Status Former Smoker  Smokeless Tobacco Never Used    Labs: Recent [employer] for ITP Cardiac and Pulmonary Rehab Latest Ref Rng & Units 10/22/2014 08/19/2015 12/16/2016   Cholestrol 100 - 199 mg/dL 122 121 145   LDLCALC 0 - 99 mg/dL 65 70 73   HDL >39 mg/dL 23(L) 22(L) 24(L)   Trlycerides 0 - 149 mg/dL 169(H) 146 240(H)       Exercise Target Goals: Exercise Program Goal: Individual exercise prescription set using results from initial 6 min walk test and THRR while considering  patient's activity barriers and safety.   Exercise Prescription Goal: Initial exercise prescription builds to 30-45 minutes a day of aerobic activity, 2-3 days per week.  Home exercise guidelines will be given to patient during program as part of exercise prescription that the participant will acknowledge.  Activity Barriers & Risk Stratification: Activity Barriers & Cardiac Risk Stratification - 12/12/17 1236      Activity Barriers & Cardiac Risk Stratification   Activity Barriers  Arthritis;Right Knee Replacement;Joint Problems;Shortness of Breath;Decreased Ventricular Function;Muscular Weakness;Deconditioning;Balance Concerns   needs left knee replacement   Cardiac Risk Stratification  High       6 Minute Walk: 6 Minute Walk    Row Name 12/12/17 1235         6 Minute Walk   Phase  Initial     Distance  694 feet     Walk Time  4.37  minutes     # of Rest Breaks  3 rest for knee pain: 27 sec, 38 sec, 33 sec     MPH  1.8     METS  1.94     RPE  17     Perceived Dyspnea   1     VO2 Peak  6.81     Symptoms  Yes (comment)     Comments  L knee pain 8/10 (made gait a little wobbly), SOB     Resting HR  99 bpm     Resting BP  132/64     Resting Oxygen Saturation   99 %     Exercise Oxygen Saturation  during 6 min walk  97 %     Max Ex. HR  156 bpm     Max Ex. BP  166/72     2 Minute Post BP  144/66        Oxygen Initial Assessment:   Oxygen Re-Evaluation:   Oxygen Discharge (Final Oxygen Re-Evaluation):   Initial Exercise Prescription: Initial Exercise Prescription - 12/12/17 1200      Date of Initial Exercise RX and Referring Provider   Date  12/12/17    Referring Provider  Lujean Amel MD      Treadmill   MPH  1.2    Grade  0.5    Minutes  15    METs  2      NuStep   Level  1    SPM  80    Minutes  15    METs  2      REL-XR   Level  1    Speed  50    Minutes  15    METs  2      Prescription Details   Frequency (times per week)  3    Duration  Progress to 45 minutes of aerobic exercise without signs/symptoms of physical distress      Intensity   THRR 40-80% of Max Heartrate  119-140    Ratings of Perceived Exertion  11-13    Perceived Dyspnea  0-4      Progression   Progression  Continue to progress workloads to maintain intensity without signs/symptoms of physical distress.      Resistance Training   Training Prescription  Yes    Weight  3 lbs    Reps  10-15       Perform Capillary Blood Glucose checks as needed.  Exercise Prescription Changes: Exercise Prescription Changes    Row Name 12/12/17 1200 12/23/17 0800 12/26/17 1500 01/09/18 1200       Response to Exercise   Blood Pressure (Admit)  132/64  -  124/84  110/80    Blood Pressure (Exercise)  166/72  -  138/82  136/64    Blood Pressure (Exit)  144/66  -  110/78  124/64    Heart Rate (Admit)  99 bpm  -  107 bpm   115 bpm    Heart Rate (Exercise)  156 bpm  -  125 bpm  125 bpm    Heart Rate (Exit)  98 bpm  -  103 bpm  85 bpm    Oxygen Saturation (Admit)  99 %  -  -  -    Oxygen Saturation (Exercise)  97 %  -  -  -    Rating of Perceived Exertion (Exercise)  17  -  13  13    Perceived Dyspnea (Exercise)  1  -  -  -    Symptoms  knee pain 8/10, SOB  -  knee pain on treadmill  knee pain on treadmill    Comments  walk test results  -  -  -    Duration  -  -  Progress to 45 minutes of aerobic exercise without signs/symptoms of physical distress  Progress to 45 minutes of aerobic exercise without signs/symptoms of physical distress taking rest breaks on treadmill only    Intensity  -  -  THRR unchanged  THRR unchanged      Progression   Progression  -  -  Continue to progress workloads to maintain intensity without signs/symptoms of physical distress.  Continue to progress workloads to maintain intensity without signs/symptoms of physical distress.    Average METs  -  -  1.81  1.94      Resistance Training   Training Prescription  -  -  Yes  Yes    Weight  -  -  3 lbs  3 lbs    Reps  -  -  10-15  10-15      Interval Training   Interval Training  -  -  No  No      Treadmill   MPH  -  -  1  1    Grade  -  -  0.5  0.5    Minutes  -  -  15  15    METs  -  -  1.83  1.83      NuStep   Level  -  -  1  3    Minutes  -  -  15  15    METs  -  -  2.1  2.4      REL-XR   Level  -  -  1  1    Minutes  -  -  15  15    METs  -  -  1.5  1.6      Home Exercise Plan   Plans to continue exercise at  -  Home (comment) walking, join gym ([employer] or [employer])  Home (comment) walking, join gym ([employer] or [employer])  Home (comment) walking, join gym ([employer] or [employer])    Frequency  -  Add 2 additional days to program exercise sessions.  Add 2 additional days to program exercise sessions.  Add 2 additional days to program exercise sessions.    Initial Home Exercises Provided  -  12/23/17  12/23/17  12/23/17        Exercise Comments: Exercise Comments    Row Name 12/19/17 [phone]           Exercise Comments  First full day of  exercise!  Patient was oriented to gym and equipment including functions, settings, policies, and procedures.  Patient's individual exercise prescription and treatment plan were reviewed.  All starting workloads were established based on the results of the 6 minute walk test done at initial orientation visit.  The plan for exercise progression was also introduced and progression will be customized based on patient's performance and goals.          Exercise Goals and Review: Exercise Goals    Row Name 12/12/17 1240             Exercise Goals   Increase Physical Activity  Yes       Intervention  Provide advice, education, support and counseling about physical activity/exercise needs.;Develop an individualized exercise prescription for aerobic and resistive training based on initial evaluation findings, risk stratification, comorbidities and participant's personal goals.       Expected Outcomes  Short Term: Attend rehab on a regular basis to increase amount of physical activity.;Long Term: Add in home exercise to make exercise part of routine and to increase amount of physical activity.;Long Term: Exercising regularly at least 3-5 days a week.       Increase Strength and Stamina  Yes       Intervention  Provide advice, education, support and counseling about physical activity/exercise needs.;Develop an individualized exercise prescription for aerobic and resistive training based on initial evaluation findings, risk stratification, comorbidities and participant's personal goals.       Expected Outcomes  Short Term: Increase workloads from initial exercise prescription for resistance, speed, and METs.;Long Term: Improve cardiorespiratory fitness, muscular endurance and strength as measured by increased METs and functional capacity (6MWT);Short Term: Perform resistance training  exercises routinely during rehab and add in resistance training at home       Able to understand and use rate of perceived exertion (RPE) scale  Yes       Intervention  Provide education and explanation on how to use RPE scale       Expected Outcomes  Short Term: Able to use RPE daily in rehab to express subjective intensity level;Long Term:  Able to use RPE to guide intensity level when exercising independently       Able to understand and use Dyspnea scale  Yes       Intervention  Provide education and explanation on how to use Dyspnea scale       Expected Outcomes  Short Term: Able to use Dyspnea scale daily in rehab to express subjective sense of shortness of breath during exertion;Long Term: Able to use Dyspnea scale to guide intensity level when exercising independently       Knowledge and understanding of Target Heart Rate Range (THRR)  Yes       Intervention  Provide education and explanation of THRR including how the numbers were predicted and where they are located for reference       Expected Outcomes  Short Term: Able to state/look up THRR;Long Term: Able to use THRR to govern intensity when exercising independently;Short Term: Able to use daily as guideline for intensity in rehab       Able to check pulse independently  Yes       Intervention  Provide education and demonstration on how to check pulse in carotid and radial arteries.;Review the importance of being able to check your own pulse for safety during independent exercise       Expected Outcomes  Short Term: Able to explain why pulse  checking is important during independent exercise;Long Term: Able to check pulse independently and accurately       Understanding of Exercise Prescription  Yes       Intervention  Provide education, explanation, and written materials on patient's individual exercise prescription       Expected Outcomes  Short Term: Able to explain program exercise prescription;Long Term: Able to explain home exercise  prescription to exercise independently          Exercise Goals Re-Evaluation : Exercise Goals Re-Evaluation    Row Name 12/19/17 0742 12/23/17 0816 12/26/17 1542 01/02/18 0853 01/09/18 1241     Exercise Goal Re-Evaluation   Exercise Goals Review  Increase Physical Activity;Increase Strength and Stamina;Able to understand and use rate of perceived exertion (RPE) scale;Knowledge and understanding of Target Heart Rate Range (THRR);Understanding of Exercise Prescription  Increase Physical Activity;Increase Strength and Stamina;Able to understand and use rate of perceived exertion (RPE) scale;Knowledge and understanding of Target Heart Rate Range (THRR);Able to check pulse independently;Understanding of Exercise Prescription  Increase Physical Activity;Increase Strength and Stamina;Understanding of Exercise Prescription  Increase Physical Activity;Increase Strength and Stamina;Understanding of Exercise Prescription;Able to understand and use rate of perceived exertion (RPE) scale;Knowledge and understanding of Target Heart Rate Range (THRR)  Increase Physical Activity;Increase Strength and Stamina;Understanding of Exercise Prescription   Comments  Reviewed RPE scale, THR and program prescription with pt today.  Pt voiced understanding and was given a copy of goals to take home.   Reviewed home exercise with pt today.  Pt plans to walk and join gym for exercise.  He is trying to decide between [facility] and [employer].  Reviewed THR, pulse, RPE, sign and symptoms, and when to call 911 or MD.  Also discussed weather considerations and indoor options.  Pt voiced understanding.  Barnabas Lister is off to a good start in rehab.  He is still taking some rest breaks on the treadmill because of his back and knees, but overall improving.  We will start to increase his seated equipment and continue to monitor his progress.   Barnabas Lister is still taking breaks on the TM but has increased his walking time to 6 min before a break is  needed. His main limiting factor is his knee pain and he is planning on eventually getting a knee replacement once he gets cleared by his cardiologist.   Barnabas Lister continues to do well in rehab.  He is working on getting better at the treadmill and is up to level 3 on the NuStep.  We will continue to monitor his progression.    Expected Outcomes  Short: Use RPE daily to regulate intensity. Long: Follow program prescription in THR.  Short: Start to add in walking at home.  Long: Join gym  Short: Increase workloads on seated equipment.  Long: Continue to increase strength and stamina.   Short: continue to increase walking time intervals on TM before rest break. Aim for 7-8 min of walking before a rest break is needed. Long: Continue with healhty lifestyle choices to stablize heart condition so that he can get his knee replacement.   Short: Increase workload on XR.  Long: Continue to increase strength and stamina.       Discharge Exercise Prescription (Final Exercise Prescription Changes): Exercise Prescription Changes - 01/09/18 1200      Response to Exercise   Blood Pressure (Admit)  110/80    Blood Pressure (Exercise)  136/64    Blood Pressure (Exit)  124/64    Heart Rate (  Admit)  115 bpm    Heart Rate (Exercise)  125 bpm    Heart Rate (Exit)  85 bpm    Rating of Perceived Exertion (Exercise)  13    Symptoms  knee pain on treadmill    Duration  Progress to 45 minutes of aerobic exercise without signs/symptoms of physical distress   taking rest breaks on treadmill only   Intensity  THRR unchanged      Progression   Progression  Continue to progress workloads to maintain intensity without signs/symptoms of physical distress.    Average METs  1.94      Resistance Training   Training Prescription  Yes    Weight  3 lbs    Reps  10-15      Interval Training   Interval Training  No      Treadmill   MPH  1    Grade  0.5    Minutes  15    METs  1.83      NuStep   Level  3    Minutes  15      METs  2.4      REL-XR   Level  1    Minutes  15    METs  1.6      Home Exercise Plan   Plans to continue exercise at  Home (comment)   walking, join gym ([employer] or [employer])   Frequency  Add 2 additional days to program exercise sessions.    Initial Home Exercises Provided  12/23/17       Nutrition:  Target Goals: Understanding of nutrition guidelines, daily intake of sodium <1556m, cholesterol <2055m calories 30% from fat and 7% or less from saturated fats, daily to have 5 or more servings of fruits and vegetables.  Biometrics: Pre Biometrics - 12/12/17 1241      Pre Biometrics   Height  6' 2.5" (1.892 m)    Weight  (!) 322 lb 11.2 oz (146.4 kg)    Waist Circumference  51 inches    Hip Circumference  47 inches    Waist to Hip Ratio  1.09 %    BMI (Calculated)  40.89    Single Leg Stand  2.94 seconds        Nutrition Therapy Plan and Nutrition Goals: Nutrition Therapy & Goals - 12/26/17 1054      Nutrition Therapy   Diet  DASH    Drug/Food Interactions  Statins/Certain Fruits    Protein (specify units)  9oz    Fiber  35 grams    Whole Grain Foods  3 servings   chooses whole grains occasionally   Saturated Fats  15 max. grams    Fruits and Vegetables  6 servings/day   8 Ideal; eats fruits and vegetables each day   Sodium  1500 grams      Personal Nutrition Goals   Nutrition Goal  Limit late night snacking, espically on work days when you have a late dinner.    Personal Goal #2  Continue to work on reducing intake of fried foods    Personal Goal #3  Increase the amount of sugar free/ calorie free beverages you drink by at least 1 glass per day    Comments  He works in food service 4 days/week and does not have control over the amount of sodium added to the lunches he heats while there. Tries to choose a meat + 2 vegetables for lunches at work. He does not add  salt to food but his son heavily salts meals at home which he sometimes eats. On his off days he is able  to cook more meals for himself such as grilled chicken sandwiches. Eats fruit and peanut butter crackers often for snacks. Does not always eat breakfast but occasionally does enjoy eggs and biscuits. He is working to reduce sugar sweetened beverage intake IE sweet tea      Intervention Plan   Intervention  Nutrition handout(s) given to patient.;Prescribe, educate and counsel regarding individualized specific dietary modifications aiming towards targeted core components such as weight, hypertension, lipid management, diabetes, heart failure and other comorbidities.   general heart healthy eating handout provided   Expected Outcomes  Short Term Goal: Understand basic principles of dietary content, such as calories, fat, sodium, cholesterol and nutrients.;Short Term Goal: A plan has been developed with personal nutrition goals set during dietitian appointment.;Long Term Goal: Adherence to prescribed nutrition plan.       Nutrition Assessments: Nutrition Assessments - 12/12/17 1057      MEDFICTS Scores   Pre Score  40       Nutrition Goals Re-Evaluation: Nutrition Goals Re-Evaluation    [place] Name 12/26/17 1112             Goals   Nutrition Goal  Increase the amount of sugar free/ calorie free beverages you drink by at least 1 glass per day       Comment  He reports adequate fluid intake to be a life-long struggle. He also gets cramps often       Expected Outcome  He will drink at least one additional glass of water per day, ideally drinking fluids that are sugar and calorie free frequently throughout the day long-term         Personal Goal #2 Re-Evaluation   Personal Goal #2  Limit late night snacking, especially on work days when you eat a late dinner         Personal Goal #3 Re-Evaluation   Personal Goal #3  Continue to work on reducing intake of fried foods          Nutrition Goals Discharge (Final Nutrition Goals Re-Evaluation): Nutrition Goals Re-Evaluation - 12/26/17 1112        Goals   Nutrition Goal  Increase the amount of sugar free/ calorie free beverages you drink by at least 1 glass per day    Comment  He reports adequate fluid intake to be a life-long struggle. He also gets cramps often    Expected Outcome  He will drink at least one additional glass of water per day, ideally drinking fluids that are sugar and calorie free frequently throughout the day long-term      Personal Goal #2 Re-Evaluation   Personal Goal #2  Limit late night snacking, especially on work days when you eat a late dinner      Personal Goal #3 Re-Evaluation   Personal Goal #3  Continue to work on reducing intake of fried foods       Psychosocial: Target Goals: Acknowledge presence or absence of significant depression and/or stress, maximize coping skills, provide positive support system. Participant is able to verbalize types and ability to use techniques and skills needed for reducing stress and depression.   Initial Review & Psychosocial Screening: Initial Psych Review & Screening - 12/12/17 1232      Initial Review   Current issues with  Current Stress Concerns    Source of Stress Concerns  Unable  to perform yard/household activities    Comments  Barnabas Lister has had a right knee replacement and needs his left one done. This causes some discomfort while doing daily activities. 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 He says he gets around pretty well if he has a cart, but wants to get stronger doing other things around the house and hobbies. His son, daughter-in-law, and granddaughter (80years old) moved in with him ever since his wife passed away a few years ago. He loves having his family around. He is starting to notice a good difference since being put on fluid pills and is motivated to work hard  so he can get his other knee replaced.        Family Dynamics   Good Support System?  Yes   son, daughter in law, daughter     Barriers   Psychosocial  barriers to participate in program  There are no identifiable barriers or psychosocial needs.;The patient should benefit from training in stress management and relaxation.      Screening Interventions   Interventions  Encouraged to exercise;Program counselor consult;To provide support and resources with identified psychosocial needs;Provide feedback about the scores to participant    Expected Outcomes  Short Term goal: Utilizing psychosocial counselor, staff and physician to assist with identification of specific Stressors or current issues interfering with healing process. Setting desired goal for each stressor or current issue identified.;Long Term Goal: Stressors or current issues are controlled or eliminated.;Long Term goal: The participant improves quality of Life and PHQ9 Scores as seen by post scores and/or verbalization of changes;Short Term goal: Identification and review with participant of any Quality of Life or Depression concerns found by scoring the questionnaire.       Quality of Life Scores:  Quality of Life - 12/12/17 1059      Quality of Life   Select  Quality of Life      Quality of Life Scores   Health/Function Pre  18.67 %    Socioeconomic Pre  25.21 %    Psych/Spiritual Pre  26.57 %    Family Pre  28.5 %    GLOBAL Pre  22.92 %      Scores of 19 and below usually indicate a poorer quality of life in these areas.  A difference of  2-3 points is a clinically meaningful difference.  A difference of 2-3 points in the total score of the Quality of Life Index has been associated with significant improvement in overall quality of life, self-image, physical symptoms, and general health in studies assessing change in quality of life.  PHQ-9: Recent Review Flowsheet Data    Depression screen [facility] 2/9 12/12/2017 05/20/2017 12/16/2016 08/19/2015 10/22/2014   Decreased Interest 0 0 0 0 0   Down, Depressed, Hopeless 0 0 0 0 0   PHQ - 2 Score 0 0 0 0 0   Altered sleeping 0 0 0 - -    Tired, decreased energy _0 - -   Change in appetite 0 0 0 - -   Feeling bad or failure about yourself  0 0 0 - -   Trouble concentrating 0 0 0 - -   Moving slowly or fidgety/restless 0 0 0 - -   Suicidal thoughts 0 0 0 - -   PHQ-9 Score _1 - -   Difficult doing work/chores Not difficult at all Not difficult at all Not difficult at  all - -     Interpretation of Total Score  Total Score Depression Severity:  1-4 = Minimal depression, 5-9 = Mild depression, 10-14 = Moderate depression, 15-19 = Moderately severe depression, 20-27 = Severe depression   Psychosocial Evaluation and Intervention: Psychosocial Evaluation - 12/19/17 0954      Psychosocial Evaluation & Interventions   Interventions  Encouraged to exercise with the program and follow exercise prescription    Comments  Counselor met with Mr. Dom Barnabas Lister) today for initial psychosocial evaluation.  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  He has a strong support system with a son and his family who live in the same home with him and a daughter and sister locally.  He is also actively involved in his local church.  Barnabas Lister reports sleeping well; eating well and denies a history or current symptoms of depression or anxiety.  He reports his mood being typically positive.  Barnabas Lister states his primary stress is his health at this time.  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  Barnabas Lister has goals to improve his health with his heart; exercise more consistently and lose some weight while in this program.  Staff wil follow with him.     Expected Outcomes  Short:  Barnabas Lister will meet with the dietician to address his weight loss goals.  He will exercise consistently with the CR program treatment plan to improve his heart health and  develop a routine.  Long:  Barnabas Lister will exercise for his overall health and be strong enough to have his knee replacement surgery.      Continue Psychosocial Services   Follow up required by staff       Psychosocial Re-Evaluation: Psychosocial Re-Evaluation    Row Name 01/02/18 0843 01/11/18 0944           Psychosocial Re-Evaluation   Current issues with  None Identified  Current Sleep Concerns      Comments  Patient reported no changes in his psycosocial health. He reports having no stress concerns and continues to have a strong support system.   Counselor follow up with Barnabas Lister today reporting positive benefits from this program recognized with more energy; eating better; feeling more confident in his ability to exercise regularly; and improved mood.  He continues to have intermittent sleep issues due to taking fluid pills and having to go to the bathroom at night.  But he reports continuing to get at least 6 hours of sleep overall.  Counselor commended Barnabas Lister for his progress made even though he has such knee pain.  He is committed to exercising consistently and this is such a positive step for him.  He will continue to be followed.        Expected Outcomes  Short: Continue to attend Cardiac Rehab on a regular basis. Long: Continue a healthy lifestyle including exercise and stress managment techniques to mantain  low stress levels.  Short:  Barnabas Lister will continue to exercise and eat more healthful for his overall health and mental health.  Long:  Barnabas Lister will exercise in order to be strong enough for knee surgery in the future.        Continue Psychosocial Services   Follow up required by staff  -  Psychosocial Discharge (Final Psychosocial Re-Evaluation): Psychosocial Re-Evaluation - 01/11/18 0944      Psychosocial Re-Evaluation   Current issues with  Current Sleep Concerns    Comments  Counselor follow up with Barnabas Lister today reporting positive benefits from this program recognized with more energy;  eating better; feeling more confident in his ability to exercise regularly; and improved mood.  He continues to have intermittent sleep issues due to taking fluid pills and having to go to the bathroom at night.  But he reports continuing to get at least 6 hours of sleep overall.  Counselor commended Barnabas Lister for his progress made even though he has such knee pain.  He is committed to exercising consistently and this is such a positive step for him.  He will continue to be followed.      Expected Outcomes  Short:  Barnabas Lister will continue to exercise and eat more healthful for his overall health and mental health.  Long:  Barnabas Lister will exercise in order to be strong enough for knee surgery in the future.         Vocational Rehabilitation: Provide vocational rehab assistance to qualifying candidates.   Vocational Rehab Evaluation & Intervention: Vocational Rehab - 12/12/17 1232      Initial Vocational Rehab Evaluation & Intervention   Assessment shows need for Vocational Rehabilitation  No       Education: Education Goals: Education classes will be provided on a variety of topics geared toward better understanding of heart health and risk factor modification. Participant will state understanding/return demonstration of topics presented as noted by education test scores.  Learning Barriers/Preferences: Learning Barriers/Preferences - 12/12/17 1232      Learning Barriers/Preferences   Learning Barriers  Exercise Concerns    Learning Preferences  Written Material       Education Topics:  AED/CPR: - Group verbal and written instruction with the use of models to demonstrate the basic use of the AED with the basic ABC's of resuscitation.   Cardiac Rehab from 01/16/2018 in [facility] Cardiac and Pulmonary Rehab  Date  01/11/18  Educator  SB  Instruction Review Code  1- Verbalizes Understanding      General Nutrition Guidelines/Fats and Fiber: -Group instruction provided by verbal, written material, models  and posters to present the general guidelines for heart healthy nutrition. Gives an explanation and review of dietary fats and fiber.   Cardiac Rehab from 01/16/2018 in [facility] Cardiac and Pulmonary Rehab  Date  01/16/18  Educator  LB  Instruction Review Code  1- Verbalizes Understanding      Controlling Sodium/Reading Food Labels: -Group verbal and written material supporting the discussion of sodium use in heart healthy nutrition. Review and explanation with models, verbal and written materials for utilization of the food label.   Exercise Physiology & General Exercise Guidelines: - Group verbal and written instruction with models to review the exercise physiology of the cardiovascular system and associated critical values. Provides general exercise guidelines with specific guidelines to those with heart or lung disease.    Aerobic Exercise & Resistance Training: - Gives group verbal and written instruction on the various components of exercise. Focuses on aerobic and resistive training programs and the benefits of this training and how to safely progress through these programs..   Flexibility, Balance, Mind/Body Relaxation: Provides group verbal/written instruction on the benefits of flexibility and balance training, including mind/body exercise modes such as yoga, pilates and tai chi.  Demonstration and skill practice provided.   Cardiac Rehab from 01/16/2018  in [facility] Cardiac and Pulmonary Rehab  Date  12/19/17  Educator  [facility]  Instruction Review Code  1- Verbalizes Understanding      Stress and Anxiety: - Provides group verbal and written instruction about the health risks of elevated stress and causes of high stress.  Discuss the correlation between heart/lung disease and anxiety and treatment options. Review healthy ways to manage with stress and anxiety.   Cardiac Rehab from 01/16/2018 in [facility] Cardiac and Pulmonary Rehab  Date  12/28/17  Educator  [facility]  Instruction Review Code  1-  Verbalizes Understanding      Depression: - Provides group verbal and written instruction on the correlation between heart/lung disease and depressed mood, treatment options, and the stigmas associated with seeking treatment.   Anatomy & Physiology of the Heart: - Group verbal and written instruction and models provide basic cardiac anatomy and physiology, with the coronary electrical and arterial systems. Review of Valvular disease and Heart Failure   Cardiac Rehab from 01/16/2018 in [facility] Cardiac and Pulmonary Rehab  Date  12/26/17  Educator  SB  Instruction Review Code  1- Verbalizes Understanding      Cardiac Procedures: - Group verbal and written instruction to review commonly prescribed medications for heart disease. Reviews the medication, class of the drug, and side effects. Includes the steps to properly store meds and maintain the prescription regimen. (beta blockers and nitrates)   Cardiac Rehab from 01/16/2018 in [facility] Cardiac and Pulmonary Rehab  Date  01/09/18  Educator  SB  Instruction Review Code  1- Verbalizes Understanding      Cardiac Medications I: - Group verbal and written instruction to review commonly prescribed medications for heart disease. Reviews the medication, class of the drug, and side effects. Includes the steps to properly store meds and maintain the prescription regimen.   Cardiac Rehab from 01/16/2018 in [facility] Cardiac and Pulmonary Rehab  Date  01/02/18  Educator  SB  Instruction Review Code  1- Verbalizes Understanding      Cardiac Medications II: -Group verbal and written instruction to review commonly prescribed medications for heart disease. Reviews the medication, class of the drug, and side effects. (all other drug classes)   Cardiac Rehab from 01/16/2018 in [facility] Cardiac and Pulmonary Rehab  Date  12/21/17  Educator  [facility]  Instruction Review Code  1- Verbalizes Understanding       Go Sex-Intimacy & Heart Disease, Get SMART - Goal  Setting: - Group verbal and written instruction through game format to discuss heart disease and the return to sexual intimacy. Provides group verbal and written material to discuss and apply goal setting through the application of the S.M.A.R.T. Method.   Cardiac Rehab from 01/16/2018 in [facility] Cardiac and Pulmonary Rehab  Date  01/09/18  Educator  SB  Instruction Review Code  1- Verbalizes Understanding      Other Matters of the Heart: - Provides group verbal, written materials and models to describe Stable Angina and Peripheral Artery. Includes description of the disease process and treatment options available to the cardiac patient.   Cardiac Rehab from 01/16/2018 in [facility] Cardiac and Pulmonary Rehab  Date  12/26/17  Educator  SB  Instruction Review Code  1- Verbalizes Understanding      Exercise & Equipment Safety: - Individual verbal instruction and demonstration of equipment use and safety with use of the equipment.   Cardiac Rehab from 01/16/2018 in [facility] Cardiac and Pulmonary Rehab  Date  12/12/17  Educator  [facility]  Instruction Review Code  1- Verbalizes Understanding      Infection Prevention: - Provides verbal and written material to individual with discussion of infection control including proper hand washing and proper equipment cleaning during exercise session.   Cardiac Rehab from 01/16/2018 in [facility] Cardiac and Pulmonary Rehab  Date  12/12/17  Educator  [facility]  Instruction Review Code  1- Verbalizes Understanding      Falls Prevention: - Provides verbal and written material to individual with discussion of falls prevention and safety.   Cardiac Rehab from 01/16/2018 in [facility] Cardiac and Pulmonary Rehab  Date  12/12/17  Educator  [facility]  Instruction Review Code  1- Verbalizes Understanding      Diabetes: - Individual verbal and written instruction to review signs/symptoms of diabetes, desired ranges of glucose level fasting, after meals and with exercise. Acknowledge that pre  and post exercise glucose checks will be done for 3 sessions at entry of program.   Know Your Numbers and Risk Factors: -Group verbal and written instruction about important numbers in your health.  Discussion of what are risk factors and how they play a role in the disease process.  Review of Cholesterol, Blood Pressure, Diabetes, and BMI and the role they play in your overall health.   Cardiac Rehab from 01/16/2018 in [facility] Cardiac and Pulmonary Rehab  Date  12/21/17  Educator  [facility]  Instruction Review Code  1- Verbalizes Understanding      Sleep Hygiene: -Provides group verbal and written instruction about how sleep can affect your health.  Define sleep hygiene, discuss sleep cycles and impact of sleep habits. Review good sleep hygiene tips.    Other: -Provides group and verbal instruction on various topics (see comments)   Knowledge Questionnaire Score: Knowledge Questionnaire Score - 12/12/17 1057      Knowledge Questionnaire Score   Pre Score  21/26   test reviewed with pt today      Core Components/Risk Factors/Patient Goals at Admission: Personal Goals and Risk Factors at Admission - 12/12/17 1230      Core Components/Risk Factors/Patient Goals on Admission    Weight Management  Yes;Obesity;Weight Loss    Intervention  Weight Management: Develop a combined nutrition and exercise program designed to reach desired caloric intake, while maintaining appropriate intake of nutrient and fiber, sodium and fats, and appropriate energy expenditure required for the weight goal.;Weight Management: Provide education and appropriate resources to help participant work on and attain dietary goals.;Weight Management/Obesity: Establish reasonable short term and long term weight goals.;Obesity: Provide education and appropriate resources to help participant work on and attain dietary goals.    Admit Weight  322 lb 11.2 oz (146.4 kg)    Goal Weight: Short Term  317 lb (143.8 kg)    Goal Weight:  Long Term  275 lb (124.7 kg)    Expected Outcomes  Short Term: Continue to assess and modify interventions until short term weight is achieved;Long Term: Adherence to nutrition and physical activity/exercise program aimed toward attainment of established weight goal;Weight Loss: Understanding of general recommendations for a balanced deficit meal plan, which promotes 1-2 lb weight loss per week and includes a negative energy balance of [phone] kcal/d;Understanding recommendations for meals to include 15-35% energy as protein, 25-35% energy from fat, 35-60% energy from carbohydrates, less than 270m of dietary cholesterol, 20-35 gm of total fiber daily;Understanding of distribution of calorie intake throughout the day with the consumption of 4-5 meals/snacks    Heart Failure  Yes  Intervention  Provide a combined exercise and nutrition program that is supplemented with education, support and counseling about heart failure. Directed toward relieving symptoms such as shortness of breath, decreased exercise tolerance, and extremity edema.    Expected Outcomes  Improve functional capacity of life;Short term: Attendance in program 2-3 days a week with increased exercise capacity. Reported lower sodium intake. Reported increased fruit and vegetable intake. Reports medication compliance.;Short term: Daily weights obtained and reported for increase. Utilizing diuretic protocols set by physician.;Long term: Adoption of self-care skills and reduction of barriers for early signs and symptoms recognition and intervention leading to self-care maintenance.    Hypertension  Yes    Intervention  Monitor prescription use compliance.;Provide education on lifestyle modifcations including regular physical activity/exercise, weight management, moderate sodium restriction and increased consumption of fresh fruit, vegetables, and low fat dairy, alcohol moderation, and smoking cessation.    Expected Outcomes  Short Term: Continued  assessment and intervention until BP is < 140/57m HG in hypertensive participants. < 130/827mHG in hypertensive participants with diabetes, heart failure or chronic kidney disease.;Long Term: Maintenance of blood pressure at goal levels.    Lipids  Yes    Intervention  Provide education and support for participant on nutrition & aerobic/resistive exercise along with prescribed medications to achieve LDL <706mHDL >39m29m  Expected Outcomes  Short Term: Participant states understanding of desired cholesterol values and is compliant with medications prescribed. Participant is following exercise prescription and nutrition guidelines.;Long Term: Cholesterol controlled with medications as prescribed, with individualized exercise RX and with personalized nutrition plan. Value goals: LDL < 70mg39mL > 40 mg.       Core Components/Risk Factors/Patient Goals Review:  Goals and Risk Factor Review    Row Name 01/02/18 0846             Core Components/Risk Factors/Patient Goals Review   Personal Goals Review  Heart Failure;Hypertension;Weight Management/Obesity;Lipids       Review  Patient reports taking all medications and exercises on a regular basis. He has been losing weight and is now down to 323 lbs from 350 lbs. He reports all lipid panel numbers are in acceptable ranges and blood pressures have been good. He weighs himself each day to assess for heart failure symptoms and is educated on warning signs of heart failure.        Expected Outcomes  Short: Short term weight goal 315 lbs. Long: Long term weight goal 300 lbs.           Core Components/Risk Factors/Patient Goals at Discharge (Final Review):  Goals and Risk Factor Review - 01/02/18 0846      Core Components/Risk Factors/Patient Goals Review   Personal Goals Review  Heart Failure;Hypertension;Weight Management/Obesity;Lipids    Review  Patient reports taking all medications and exercises on a regular basis. He has been losing weight  and is now down to 323 lbs from 350 lbs. He reports all lipid panel numbers are in acceptable ranges and blood pressures have been good. He weighs himself each day to assess for heart failure symptoms and is educated on warning signs of heart failure.     Expected Outcomes  Short: Short term weight goal 315 lbs. Long: Long term weight goal 300 lbs.        ITP Comments: ITP Comments    Row Name 12/12/17 1226 12/21/17 0554 12/21/17 0601 01/18/18 0545     ITP Comments  Med Review completed. Initial ITP created. Diagnosis can be found  in [facility] 6/27  30 day review completed. ITP sent to Dr. Emily Filbert, Medical Director of Cardiac Rehab. Continue with ITP unless changes are made by physician  30 day review completed. ITP sent to Dr. Emily Filbert, Medical Director of Cardiac Rehab. Continue with ITP unless changes are made by physician  30 day review. Continue with ITP unless direccted changes per Medical Director Chart Review.       Comments:

## 2018-01-18 NOTE — Progress Notes (Signed)
Daily Session Note  Patient Details  Name: Christopher [name] MRN: [id number] Date of Birth: June 08, 1945 Referring Provider:     Cardiac Rehab from 12/12/2017 in [facility] Cardiac and Pulmonary Rehab  Referring Provider  Lujean Amel MD      Encounter Date: 01/18/2018  Check In: Session Check In - 01/18/18 0728      Check-In   Supervising physician immediately available to respond to emergencies  See telemetry face sheet for immediately available ER MD    Location  [facility]-Cardiac & Pulmonary Rehab    Staff Present  Alberteen Sam, MA, RCEP, CCRP, Exercise Physiologist;Tarance Balan [employer];Heath Lark, RN, BSN, CCRP    Medication changes reported      No    Fall or balance concerns reported     No    Warm-up and Cool-down  Performed on first and last piece of equipment    Resistance Training Performed  Yes    VAD Patient?  No    PAD/SET Patient?  No      Pain Assessment   Currently in Pain?  No/denies          Social History   Tobacco Use  Smoking Status Former Smoker  Smokeless Tobacco Never Used    Goals Met:  Independence with exercise equipment Exercise tolerated well No report of cardiac concerns or symptoms Strength training completed today  Goals Unmet:  Not Applicable  Comments: Pt able to follow exercise prescription today without complaint.  Will continue to monitor for progression.    Dr. Emily Filbert is Medical Director for [place] and [facility].

## 2018-01-20 ENCOUNTER — Encounter: Payer: Medicare Other | Admitting: *Deleted

## 2018-01-20 ENCOUNTER — Ambulatory Visit: Payer: Self-pay | Admitting: Family Medicine

## 2018-01-20 DIAGNOSIS — I5022 Chronic systolic (congestive) heart failure: Secondary | ICD-10-CM

## 2018-01-20 NOTE — Progress Notes (Signed)
Daily Session Note  Patient Details  Name: Christopher [name] MRN: [id number] Date of Birth: 02-09-1946 Referring Provider:     Cardiac Rehab from 12/12/2017 in [facility] Cardiac and Pulmonary Rehab  Referring Provider  Lujean Amel MD      Encounter Date: 01/20/2018  Check In: Session Check In - 01/20/18 0721      Check-In   Supervising physician immediately available to respond to emergencies  See telemetry face sheet for immediately available ER MD    Location  [facility]-Cardiac & Pulmonary Rehab    Staff Present  Renita Papa, RN BSN;Arlyce Circle Luan Pulling, MA, RCEP, CCRP, Exercise Physiologist;Amanda Oletta Darter, [place], ACSM CEP, Exercise Physiologist    Medication changes reported      No    Fall or balance concerns reported     No    Warm-up and Cool-down  Performed on first and last piece of equipment    Resistance Training Performed  Yes    VAD Patient?  No    PAD/SET Patient?  No      Pain Assessment   Currently in Pain?  No/denies          Social History   Tobacco Use  Smoking Status Former Smoker  Smokeless Tobacco Never Used    Goals Met:  Independence with exercise equipment Exercise tolerated well No report of cardiac concerns or symptoms Strength training completed today  Goals Unmet:  Not Applicable  Comments: Pt able to follow exercise prescription today without complaint.  Will continue to monitor for progression.    Dr. Emily Filbert is Medical Director for [place] and [facility].

## 2018-01-23 ENCOUNTER — Encounter: Payer: Medicare Other | Admitting: *Deleted

## 2018-01-23 DIAGNOSIS — I5022 Chronic systolic (congestive) heart failure: Secondary | ICD-10-CM

## 2018-01-23 NOTE — Progress Notes (Signed)
Daily Session Note  Patient Details  Name: Christopher [name] MRN: [id number] Date of Birth: 02/25/1946 Referring Provider:     Cardiac Rehab from 12/12/2017 in [facility] Cardiac and Pulmonary Rehab  Referring Provider  Lujean Amel MD      Encounter Date: 01/23/2018  Check In: Session Check In - 01/23/18 0754      Check-In   Supervising physician immediately available to respond to emergencies  See telemetry face sheet for immediately available ER MD    Location  [facility]-Cardiac & Pulmonary Rehab    Staff Present  Earlean Shawl, BS, ACSM CEP, Exercise Physiologist;Carroll Enterkin, RN, BSN;Susanne Bice, RN, BSN, CCRP    Medication changes reported      No    Fall or balance concerns reported     No    Tobacco Cessation  No Change    Warm-up and Cool-down  Performed on first and last piece of equipment    Resistance Training Performed  Yes    VAD Patient?  No    PAD/SET Patient?  No      Pain Assessment   Currently in Pain?  No/denies    Multiple Pain Sites  No          Social History   Tobacco Use  Smoking Status Former Smoker  Smokeless Tobacco Never Used    Goals Met:  Independence with exercise equipment Exercise tolerated well No report of cardiac concerns or symptoms Strength training completed today  Goals Unmet:  Not Applicable  Comments: Pt able to follow exercise prescription today without complaint.  Will continue to monitor for progression.    Dr. Emily Filbert is Medical Director for [place] and [facility].

## 2018-01-24 ENCOUNTER — Encounter: Payer: Self-pay | Admitting: Family Medicine

## 2018-01-24 ENCOUNTER — Ambulatory Visit (INDEPENDENT_AMBULATORY_CARE_PROVIDER_SITE_OTHER): Payer: Medicare Other | Admitting: Family Medicine

## 2018-01-24 VITALS — BP 120/70 | HR 58 | Ht 74.5 in | Wt 332.0 lb

## 2018-01-24 DIAGNOSIS — G459 Transient cerebral ischemic attack, unspecified: Secondary | ICD-10-CM

## 2018-01-24 DIAGNOSIS — M170 Bilateral primary osteoarthritis of knee: Secondary | ICD-10-CM | POA: Diagnosis not present

## 2018-01-24 DIAGNOSIS — Z1211 Encounter for screening for malignant neoplasm of colon: Secondary | ICD-10-CM | POA: Diagnosis not present

## 2018-01-24 DIAGNOSIS — I10 Essential (primary) hypertension: Secondary | ICD-10-CM

## 2018-01-24 DIAGNOSIS — E7849 Other hyperlipidemia: Secondary | ICD-10-CM

## 2018-01-24 DIAGNOSIS — I25118 Atherosclerotic heart disease of native coronary artery with other forms of angina pectoris: Secondary | ICD-10-CM | POA: Diagnosis not present

## 2018-01-24 MED ORDER — AMLODIPINE BESYLATE 5 MG PO TABS: ORAL_TABLET | ORAL | 1 refills | Status: DC

## 2018-01-24 MED ORDER — RAMIPRIL 10 MG PO CAPS: ORAL_CAPSULE | ORAL | 1 refills | Status: DC

## 2018-01-24 MED ORDER — METOPROLOL SUCCINATE ER 100 MG PO TB24: ORAL_TABLET | ORAL | 1 refills | Status: DC

## 2018-01-24 MED ORDER — ETODOLAC 500 MG PO TABS: 500.0000 mg | ORAL_TABLET | Freq: Two times a day (BID) | ORAL | 1 refills | Status: DC

## 2018-01-24 MED ORDER — FENOFIBRATE 160 MG PO TABS: 160.0000 mg | ORAL_TABLET | Freq: Every day | ORAL | 1 refills | Status: DC

## 2018-01-24 MED ORDER — SIMVASTATIN 40 MG PO TABS: ORAL_TABLET | ORAL | 1 refills | Status: DC

## 2018-01-24 MED ORDER — CLOPIDOGREL BISULFATE 75 MG PO TABS: ORAL_TABLET | ORAL | 1 refills | Status: DC

## 2018-01-24 NOTE — Progress Notes (Signed)
Date:  01/24/2018   Name:  Christopher [name]   DOB:  01-18-1946   MRN:  [id number]   Chief Complaint: Hypertension; Hyperlipidemia; Congestive Heart Failure (taking plavix); and Arthritis (in knees- takes etodolac) Hypertension  This is a new problem. The current episode started more than 1 year ago. The problem is unchanged. The problem is controlled. Pertinent negatives include no anxiety, blurred vision, chest pain, headaches, malaise/fatigue, neck pain, orthopnea, palpitations, peripheral edema, PND, shortness of breath or sweats. There are no associated agents to hypertension. There are no known risk factors for coronary artery disease. Past treatments include ACE inhibitors, calcium channel blockers and beta blockers. The current treatment provides moderate improvement. There are no compliance problems.  There is no history of angina, kidney disease, CAD/MI, CVA, heart failure, left ventricular hypertrophy, PVD or retinopathy. There is no history of chronic renal disease, a hypertension causing med or renovascular disease.  Hyperlipidemia  He has no history of chronic renal disease. Pertinent negatives include no chest pain or shortness of breath.  Congestive Heart Failure  Pertinent negatives include no abdominal pain, chest pain, fatigue, palpitations, shortness of breath or unexpected weight change.  Arthritis  He reports no joint swelling. Pertinent negatives include no diarrhea, dysuria, fatigue, fever or rash.     Review of Systems  Constitutional: Negative for appetite change, chills, fatigue, fever, malaise/fatigue and unexpected weight change.  HENT: Negative for ear pain, facial swelling, hearing loss, nosebleeds, sneezing, sore throat and trouble swallowing.   Eyes: Negative for blurred vision, photophobia, pain, discharge, redness, itching and visual disturbance.  Respiratory: Negative for cough, choking, chest tightness, shortness of breath and wheezing.     Cardiovascular: Negative for chest pain, palpitations, orthopnea, leg swelling and PND.  Gastrointestinal: Negative for abdominal pain, blood in stool, constipation, diarrhea, nausea, rectal pain and vomiting.  Endocrine: Negative for cold intolerance, heat intolerance, polydipsia, polyphagia and polyuria.  Genitourinary: Negative for decreased urine volume, difficulty urinating, discharge, dysuria, flank pain, frequency, hematuria, penile pain, penile swelling, scrotal swelling, testicular pain and urgency.  Musculoskeletal: Positive for arthritis. Negative for back pain, joint swelling, neck pain and neck stiffness.  Skin: Negative for color change and rash.  Allergic/Immunologic: Negative for immunocompromised state.  Neurological: Negative for dizziness, tremors, seizures, syncope, speech difficulty, weakness, light-headedness, numbness and headaches.  Hematological: Does not bruise/bleed easily.  Psychiatric/Behavioral: Negative for agitation, behavioral problems, confusion, dysphoric mood, hallucinations, self-injury and suicidal ideas. The patient is not nervous/anxious.     Patient Active Problem List   Diagnosis Date Noted  . TIA (transient ischemic attack) 12/12/2017  . 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 Encounter for general adult medical examination without abnormal findings 10/22/2014  . Disorder of kidney and ureter 10/22/2014  . Renal colic 76/16/0737  . 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 Creatinine elevation 10/22/2014  . Essential (primary) hypertension 10/22/2014  . H/O hypercholesterolemia 10/22/2014  . Dysmetabolic syndrome 10/62/6948  . H/O acute myocardial infarction 10/22/2014  . Adiposity 10/22/2014  . Impaired renal function 10/22/2014  . Need for vaccination 10/22/2014  . 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 S/P coronary artery stent placement 04/09/2014  . Chronic kidney disease, stage [name] (moderate)  (HCC) 01/25/2011    No Known Allergies  Past Surgical History:  Procedure Laterality Date  . COLONOSCOPY  2011  . KNEE ARTHROSCOPY Right 2016    Social History   Tobacco Use  . Smoking status: Former Research scientist (life sciences)  . Smokeless tobacco:  Never Used  Substance Use Topics  . Alcohol use: No    Alcohol/week: 0.0 standard drinks  . Drug use: No     Medication list has been reviewed and updated.  Current Meds  Medication Sig  . amLODipine (NORVASC) 5 MG tablet TAKE 1 TABLET(5 MG) BY MOUTH DAILY  . Cholecalciferol (VITAMIN D) 2000 units tablet Take by mouth.  . clopidogrel (PLAVIX) 75 MG tablet TAKE 1 TABLET BY MOUTH EVERY DAY AT 6:00AM  . diclofenac sodium (VOLTAREN) 1 % GEL Apply 4 g topically 4 (four) times daily. (Patient taking differently: Apply 4 g topically 4 (four) times daily. Dr Marry Guan)  . etodolac (LODINE) 500 MG tablet Take 1 tablet (500 mg total) by mouth 2 (two) times daily.  . fenofibrate 160 MG tablet Take 1 tablet (160 mg total) by mouth daily.  . metoprolol succinate (TOPROL-XL) 100 MG 24 hr tablet Take with or immediately following a meal.  . niacin 500 MG CR capsule Take 1 capsule (500 mg total) by mouth daily at 6 (six) AM.  . ramipril (ALTACE) 10 MG capsule TAKE 1 CAPSULE(10 MG) BY MOUTH DAILY  . simvastatin (ZOCOR) 40 MG tablet One a day  . torsemide (DEMADEX) 10 MG tablet Take 1 tablet by mouth daily. Dr Clayborn Bigness  . [DISCONTINUED] amLODipine (NORVASC) 5 MG tablet TAKE 1 TABLET(5 MG) BY MOUTH DAILY  . [DISCONTINUED] clopidogrel (PLAVIX) 75 MG tablet TAKE 1 TABLET BY MOUTH EVERY DAY AT 6:00AM  . [DISCONTINUED] etodolac (LODINE) 500 MG tablet TAKE 1 TABLET BY MOUTH TWICE DAILY  . [DISCONTINUED] fenofibrate 160 MG tablet Take 1 tablet (160 mg total) by mouth daily.  . [DISCONTINUED] metoprolol succinate (TOPROL-XL) 100 MG 24 hr tablet TAKE 1 TABLET BY MOUTH EVERY DAY IMMEDIATELY FOLLOWING A MEAL  . [DISCONTINUED] ramipril (ALTACE) 10 MG capsule TAKE 1 CAPSULE(10 MG)  BY MOUTH DAILY  . [DISCONTINUED] simvastatin (ZOCOR) 40 MG tablet TAKE 1 TABLET BY MOUTH EVERY DAY AT 6 AM    PHQ 2/9 Scores 01/24/2018 12/12/2017 05/20/2017 12/16/2016  PHQ - 2 Score 0 0 0 0  PHQ- 9 Score 0 1 1 2     Physical Exam  Constitutional: He is oriented to person, place, and time. He appears well-developed and well-nourished.  HENT:  Head: Normocephalic.  Right Ear: External ear normal.  Left Ear: External ear normal.  Nose: Nose normal.  Mouth/Throat: Oropharynx is clear and moist.  Eyes: Pupils are equal, round, and reactive to light. Conjunctivae and EOM are normal. Right eye exhibits no discharge. Left eye exhibits no discharge. No scleral icterus.  Neck: Normal range of motion. Neck supple. No JVD present. No tracheal deviation present. No thyromegaly present.  Cardiovascular: Normal rate, regular rhythm, normal heart sounds and intact distal pulses. Exam reveals no gallop and no friction rub.  No murmur heard. Pulmonary/Chest: Breath sounds normal. No respiratory distress. He has no wheezes. He has no rales.  Abdominal: Soft. Bowel sounds are normal. He exhibits no mass. There is no hepatosplenomegaly. There is no tenderness. There is no rebound, no guarding and no CVA tenderness.  Musculoskeletal: Normal range of motion. He exhibits no edema or tenderness.  Lymphadenopathy:    He has no cervical adenopathy.  Neurological: He is alert and oriented to person, place, and time. He has normal strength and normal reflexes. No cranial nerve deficit.  Skin: Skin is warm. No rash noted.  Nursing note and vitals reviewed.   BP 120/70   Pulse (!) 58   Ht  6' 2.5" (1.892 m)   Wt (!) 332 lb (150.6 kg)   BMI 42.06 kg/m   Assessment and Plan:  1. Essential (primary) hypertension Stable on meds- refill metoprolol, amlodipine and ramipril/ draw renal panel - metoprolol succinate (TOPROL-XL) 100 MG 24 hr tablet; Take with or immediately following a meal.  Dispense: 90 tablet;  Refill: 1 - amLODipine (NORVASC) 5 MG tablet; TAKE 1 TABLET(5 MG) BY MOUTH DAILY  Dispense: 90 tablet; Refill: 1 - ramipril (ALTACE) 10 MG capsule; TAKE 1 CAPSULE(10 MG) BY MOUTH DAILY  Dispense: 90 capsule; Refill: 1 - Renal Function Panel  2. Familial multiple lipoprotein-type hyperlipidemia Stable on med- refill fenofibrate and simvastatin/ draw a lipid panel - fenofibrate 160 MG tablet; Take 1 tablet (160 mg total) by mouth daily.  Dispense: 90 tablet; Refill: 1 - simvastatin (ZOCOR) 40 MG tablet; One a day  Dispense: 90 tablet; Refill: 1 - Lipid panel  3. TIA (transient ischemic attack) Stable on med- refill plavix - clopidogrel (PLAVIX) 75 MG tablet; TAKE 1 TABLET BY MOUTH EVERY DAY AT 6:00AM  Dispense: 90 tablet; Refill: 1  4. Primary osteoarthritis of both knees Stable on med- refill etodolac - etodolac (LODINE) 500 MG tablet; Take 1 tablet (500 mg total) by mouth 2 (two) times daily.  Dispense: 180 tablet; Refill: 1  5. Colon cancer screening Send to GI for colonoscopy - Ambulatory referral to Gastroenterology   Dr. Otilio Miu [facility]  01/24/2018

## 2018-01-25 VITALS — Ht 74.5 in | Wt 328.0 lb

## 2018-01-25 DIAGNOSIS — I5022 Chronic systolic (congestive) heart failure: Secondary | ICD-10-CM

## 2018-01-25 LAB — LIPID PANEL
CHOLESTEROL TOTAL: 104 mg/dL (ref 100–199)
Chol/HDL Ratio: 5 ratio (ref 0.0–5.0)
HDL: 21 mg/dL — ABNORMAL LOW (ref >39)
LDL Calculated: 61 mg/dL (ref 0–99)
TRIGLYCERIDES: 112 mg/dL (ref 0–149)
VLDL Cholesterol Cal: 22 mg/dL (ref 5–40)

## 2018-01-25 LAB — RENAL FUNCTION PANEL
ALBUMIN: 4 g/dL (ref 3.5–4.8)
BUN/Creatinine Ratio: 18 (ref 10–24)
BUN: 46 mg/dL — AB (ref 8–27)
CHLORIDE: 109 mmol/L — AB (ref 96–106)
CO2: 18 mmol/L — AB (ref 20–29)
CREATININE: 2.52 mg/dL — AB (ref 0.76–1.27)
Calcium: 9.4 mg/dL (ref 8.6–10.2)
GFR calc Af Amer: 29 mL/min/{1.73_m2} — ABNORMAL LOW (ref >59)
GFR calc non Af Amer: 25 mL/min/{1.73_m2} — ABNORMAL LOW (ref >59)
Glucose: 103 mg/dL — ABNORMAL HIGH (ref 65–99)
PHOSPHORUS: 3.3 mg/dL (ref 2.5–4.5)
Potassium: 4.9 mmol/L (ref 3.5–5.2)
Sodium: 144 mmol/L (ref 134–144)

## 2018-01-25 NOTE — Progress Notes (Signed)
Daily Session Note  Patient Details  Name: SAHEJ SCHRIEBER MRN: [id number] Date of Birth: 1946/03/06 Referring Provider:     Cardiac Rehab from 12/12/2017 in [facility] Cardiac and Pulmonary Rehab  Referring Provider  Lujean Amel MD      Encounter Date: 01/25/2018  Check In: Session Check In - 01/25/18 0726      Check-In   Supervising physician immediately available to respond to emergencies  See telemetry face sheet for immediately available ER MD    Location  [facility]-Cardiac & Pulmonary Rehab    Staff Present  Justin Mend Lorre Nick, MA, RCEP, CCRP, Exercise Physiologist;Susanne Bice, RN, BSN, CCRP    Medication changes reported      No    Fall or balance concerns reported     No    Warm-up and Cool-down  Performed on first and last piece of equipment    Resistance Training Performed  Yes    VAD Patient?  No    PAD/SET Patient?  No      Pain Assessment   Currently in Pain?  No/denies          Social History   Tobacco Use  Smoking Status Former Smoker  Smokeless Tobacco Never Used    Goals Met:  Independence with exercise equipment Exercise tolerated well Personal goals reviewed No report of cardiac concerns or symptoms Strength training completed today  Goals Unmet:  Not Applicable  Comments: Pt able to follow exercise prescription today without complaint.  Will continue to monitor for progression.  [place] Name 12/12/17 1235 01/25/18 0900       6 Minute Walk   Phase  Initial  Discharge    Distance  694 feet  430 feet    Distance % Change  -  -38 %    Distance Feet Change  -  -264 ft    Walk Time  4.37 minutes  2.87 minutes    # of Rest Breaks  3 rest for knee pain: 27 sec, 38 sec, 33 sec  4 15 sec, 33 sec, 1:22, 58 sec    MPH  1.8  1.7    METS  1.94  0.6    RPE  17  15    Perceived Dyspnea   1  3    VO2 Peak  6.81  2.1    Symptoms  Yes (comment)  Yes (comment)    Comments  L knee pain 8/10 (made gait a little  wobbly), SOB  knee pain 10/10, SOB    Resting HR  99 bpm  70 bpm    Resting BP  132/64  126/64    Resting Oxygen Saturation   99 %  93 %    Exercise Oxygen Saturation  during 6 min walk  97 %  90 %    Max Ex. HR  156 bpm  84 bpm    Max Ex. BP  166/72  174/84    2 Minute Post BP  144/66  -        Dr. Emily Filbert is Medical Director for [facility].

## 2018-01-27 ENCOUNTER — Encounter: Payer: Medicare Other | Admitting: *Deleted

## 2018-01-27 DIAGNOSIS — I5022 Chronic systolic (congestive) heart failure: Secondary | ICD-10-CM

## 2018-01-27 NOTE — Progress Notes (Signed)
Daily Session Note  Patient Details  Name: Christopher [name] MRN: [id number] Date of Birth: 1945/09/20 Referring Provider:     Cardiac Rehab from 12/12/2017 in [facility] Cardiac and Pulmonary Rehab  Referring Provider  Lujean Amel MD      Encounter Date: 01/27/2018  Check In: Session Check In - 01/27/18 0849      Check-In   Supervising physician immediately available to respond to emergencies  See telemetry face sheet for immediately available ER MD    Location  [facility]-Cardiac & Pulmonary Rehab    Staff Present  Renita Papa, RN BSN;Anatasia Tino Luan [place], MA, RCEP, CCRP, Exercise Physiologist;Amanda Oletta Darter, [place], ACSM CEP, Exercise Physiologist    Medication changes reported      No    Fall or balance concerns reported     No    Warm-up and Cool-down  Performed on first and last piece of equipment    Resistance Training Performed  Yes    VAD Patient?  No    PAD/SET Patient?  No      Pain Assessment   Currently in Pain?  No/denies          Social History   Tobacco Use  Smoking Status Former Smoker  Smokeless Tobacco Never Used    Goals Met:  Independence with exercise equipment Exercise tolerated well No report of cardiac concerns or symptoms Strength training completed today  Goals Unmet:  Not Applicable  Comments: Pt able to follow exercise prescription today without complaint.  Will continue to monitor for progression.    Dr. Emily Filbert is Medical Director for [facility].

## 2018-01-30 ENCOUNTER — Encounter: Payer: Medicare Other | Admitting: *Deleted

## 2018-01-30 DIAGNOSIS — I5022 Chronic systolic (congestive) heart failure: Secondary | ICD-10-CM | POA: Diagnosis not present

## 2018-01-30 DIAGNOSIS — N184 Chronic kidney disease, stage 4 (severe): Secondary | ICD-10-CM | POA: Diagnosis not present

## 2018-01-30 DIAGNOSIS — I129 Hypertensive chronic kidney disease with stage 1 through stage 4 chronic kidney disease, or unspecified chronic kidney disease: Secondary | ICD-10-CM | POA: Diagnosis not present

## 2018-01-30 DIAGNOSIS — N200 Calculus of kidney: Secondary | ICD-10-CM | POA: Diagnosis not present

## 2018-01-30 NOTE — Progress Notes (Signed)
Daily Session Note  Patient Details  Name: Christopher [name] MRN: [id number] Date of Birth: [birth year] Referring Provider:     Cardiac Rehab from 12/12/2017 in [facility] Cardiac and Pulmonary Rehab  Referring Provider  Lujean Amel MD      Encounter Date: 01/30/2018  Check In: Session Check In - 01/30/18 0757      Check-In   Supervising physician immediately available to respond to emergencies  See telemetry face sheet for immediately available ER MD    Location  [facility]    Staff Present  Earlean Shawl, BS, ACSM CEP, Exercise Physiologist;Susanne Bice, RN, BSN, CCRP;Jessica [place], MA, RCEP, CCRP, Exercise Physiologist    Medication changes reported      No    Fall or balance concerns reported     No    Tobacco Cessation  No Change    Warm-up and Cool-down  Performed on first and last piece of equipment    Resistance Training Performed  Yes    VAD Patient?  No    PAD/SET Patient?  No      Pain Assessment   Currently in Pain?  No/denies    Multiple Pain Sites  No          Social History   Tobacco Use  Smoking Status Former Smoker  Smokeless Tobacco Never Used    Goals Met:  Independence with exercise equipment Exercise tolerated well No report of cardiac concerns or symptoms Strength training completed today  Goals Unmet:  Not Applicable  Comments: Pt able to follow exercise prescription today without complaint.  Will continue to monitor for progression.    Dr. Emily Filbert is Medical Director for [facility].

## 2018-02-01 DIAGNOSIS — I5022 Chronic systolic (congestive) heart failure: Secondary | ICD-10-CM | POA: Diagnosis not present

## 2018-02-01 NOTE — Patient Instructions (Signed)
Discharge Patient Instructions  Patient Details  Name: Christopher [name] MRN: [id number] Date of Birth: 02-Mar-1946 Referring Provider:  Yolonda Kida, MD   Number of Visits: 44  Reason for Discharge:  Patient reached a stable level of exercise. Patient independent in their exercise. Patient has met program and personal goals.  Smoking History:  Social History   Tobacco Use  Smoking Status Former Smoker  Smokeless Tobacco Never Used    Diagnosis:  Heart failure, chronic systolic (HCC)  Initial Exercise Prescription: Initial Exercise Prescription - 12/12/17 1200      Date of Initial Exercise RX and Referring Provider   Date  12/12/17    Referring Provider  Lujean Amel MD      Treadmill   MPH  1.2    Grade  0.5    Minutes  15    METs  2      NuStep   Level  1    SPM  80    Minutes  15    METs  2      REL-XR   Level  1    Speed  50    Minutes  15    METs  2      Prescription Details   Frequency (times per week)  3    Duration  Progress to 45 minutes of aerobic exercise without signs/symptoms of physical distress      Intensity   THRR 40-80% of Max Heartrate  119-140    Ratings of Perceived Exertion  11-13    Perceived Dyspnea  0-4      Progression   Progression  Continue to progress workloads to maintain intensity without signs/symptoms of physical distress.      Resistance Training   Training Prescription  Yes    Weight  3 lbs    Reps  10-15       Discharge Exercise Prescription (Final Exercise Prescription Changes): Exercise Prescription Changes - 01/25/18 0900      Response to Exercise   Blood Pressure (Admit)  128/68    Blood Pressure (Exercise)  138/84    Blood Pressure (Exit)  134/80    Heart Rate (Admit)  108 bpm    Heart Rate (Exercise)  125 bpm    Heart Rate (Exit)  103 bpm    Rating of Perceived Exertion (Exercise)  13    Symptoms  knee pain on treadmill    Duration  Progress to 45 minutes of aerobic exercise without  signs/symptoms of physical distress   taking rest breaks on treadmill only   Intensity  THRR unchanged      Progression   Progression  Continue to progress workloads to maintain intensity without signs/symptoms of physical distress.    Average METs  2.08      Resistance Training   Training Prescription  Yes    Weight  3 lbs    Reps  10-15      Interval Training   Interval Training  No      Treadmill   MPH  1    Grade  0.5    Minutes  15    METs  1.83      NuStep   Level  4    Minutes  15    METs  2.9      REL-XR   Level  1    Minutes  15    METs  1.5      Home Exercise Plan  Plans to continue exercise at  Home (comment)   walking, join gym ([employer] or [employer])   Frequency  Add 2 additional days to program exercise sessions.    Initial Home Exercises Provided  12/23/17       Functional Capacity: 6 Minute Walk    Row Name 12/12/17 1235 01/25/18 0900       6 Minute Walk   Phase  Initial  Discharge    Distance  694 feet  430 feet    Distance % Change  -  -38 %    Distance Feet Change  -  -264 ft    Walk Time  4.37 minutes  2.87 minutes    # of Rest Breaks  3 rest for knee pain: 27 sec, 38 sec, 33 sec  4 15 sec, 33 sec, 1:22, 58 sec    MPH  1.8  1.7    METS  1.94  0.6    RPE  17  15    Perceived Dyspnea   1  3    VO2 Peak  6.81  2.1    Symptoms  Yes (comment)  Yes (comment)    Comments  L knee pain 8/10 (made gait a little wobbly), SOB  knee pain 10/10, SOB    Resting HR  99 bpm  70 bpm    Resting BP  132/64  126/64    Resting Oxygen Saturation   99 %  93 %    Exercise Oxygen Saturation  during 6 min walk  97 %  90 %    Max Ex. HR  156 bpm  84 bpm    Max Ex. BP  166/72  174/84    2 Minute Post BP  144/66  -       Quality of Life: Quality of Life - 01/30/18 1616      Quality of Life   Select  Quality of Life      Quality of Life Scores   Health/Function Pre  18.67 %    Health/Function Post  20.23 %    Health/Function % Change  8.36 %     Socioeconomic Pre  25.21 %    Socioeconomic Post  22.63 %    Socioeconomic % Change   -10.23 %    Psych/Spiritual Pre  26.57 %    Psych/Spiritual Post  27.21 %    Psych/Spiritual % Change  2.41 %    Family Pre  28.5 %    Family Post  21.9 %    Family % Change  -23.16 %    GLOBAL Pre  22.92 %    GLOBAL Post  22.41 %    GLOBAL % Change  -2.23 %       Personal Goals: Goals established at orientation with interventions provided to work toward goal. Personal Goals and Risk Factors at Admission - 12/12/17 1230      Core Components/Risk Factors/Patient Goals on Admission    Weight Management  Yes;Obesity;Weight Loss    Intervention  Weight Management: Develop a combined nutrition and exercise program designed to reach desired caloric intake, while maintaining appropriate intake of nutrient and fiber, sodium and fats, and appropriate energy expenditure required for the weight goal.;Weight Management: Provide education and appropriate resources to help participant work on and attain dietary goals.;Weight Management/Obesity: Establish reasonable short term and long term weight goals.;Obesity: Provide education and appropriate resources to help participant work on and attain dietary goals.    Admit Weight  322 lb  11.2 oz (146.4 kg)    Goal Weight: Short Term  317 lb (143.8 kg)    Goal Weight: Long Term  275 lb (124.7 kg)    Expected Outcomes  Short Term: Continue to assess and modify interventions until short term weight is achieved;Long Term: Adherence to nutrition and physical activity/exercise program aimed toward attainment of established weight goal;Weight Loss: Understanding of general recommendations for a balanced deficit meal plan, which promotes 1-2 lb weight loss per week and includes a negative energy balance of [phone] kcal/d;Understanding recommendations for meals to include 15-35% energy as protein, 25-35% energy from fat, 35-60% energy from carbohydrates, less than '200mg'$  of dietary  cholesterol, 20-35 gm of total fiber daily;Understanding of distribution of calorie intake throughout the day with the consumption of 4-5 meals/snacks    Heart Failure  Yes    Intervention  Provide a combined exercise and nutrition program that is supplemented with education, support and counseling about heart failure. Directed toward relieving symptoms such as shortness of breath, decreased exercise tolerance, and extremity edema.    Expected Outcomes  Improve functional capacity of life;Short term: Attendance in program 2-3 days a week with increased exercise capacity. Reported lower sodium intake. Reported increased fruit and vegetable intake. Reports medication compliance.;Short term: Daily weights obtained and reported for increase. Utilizing diuretic protocols set by physician.;Long term: Adoption of self-care skills and reduction of barriers for early signs and symptoms recognition and intervention leading to self-care maintenance.    Hypertension  Yes    Intervention  Monitor prescription use compliance.;Provide education on lifestyle modifcations including regular physical activity/exercise, weight management, moderate sodium restriction and increased consumption of fresh fruit, vegetables, and low fat dairy, alcohol moderation, and smoking cessation.    Expected Outcomes  Short Term: Continued assessment and intervention until BP is < 140/29m HG in hypertensive participants. < 130/856mHG in hypertensive participants with diabetes, heart failure or chronic kidney disease.;Long Term: Maintenance of blood pressure at goal levels.    Lipids  Yes    Intervention  Provide education and support for participant on nutrition & aerobic/resistive exercise along with prescribed medications to achieve LDL '70mg'$ , HDL >'40mg'$ .    Expected Outcomes  Short Term: Participant states understanding of desired cholesterol values and is compliant with medications prescribed. Participant is following exercise  prescription and nutrition guidelines.;Long Term: Cholesterol controlled with medications as prescribed, with individualized exercise RX and with personalized nutrition plan. Value goals: LDL < '70mg'$ , HDL > 40 mg.        Personal Goals Discharge: Goals and Risk Factor Review - 01/25/18 0907      Core Components/Risk Factors/Patient Goals Review   Personal Goals Review  Heart Failure;Hypertension;Weight Management/Obesity;Lipids    Review  JaBarnabas Listers going to graduate next week.  He is going to continue to work on weight loss!  He is also monitoring his heart failure and denies symptoms other than SOB.  He will continue to monitor his blood pressures and heart failure.     Expected Outcomes  Short: Graduate!  Long: Continue to manage heart failure.        Exercise Goals and Review: Exercise Goals    Row Name 12/12/17 1240             Exercise Goals   Increase Physical Activity  Yes       Intervention  Provide advice, education, support and counseling about physical activity/exercise needs.;Develop an individualized exercise prescription for aerobic and resistive training based on initial evaluation findings,  risk stratification, comorbidities and participant's personal goals.       Expected Outcomes  Short Term: Attend rehab on a regular basis to increase amount of physical activity.;Long Term: Add in home exercise to make exercise part of routine and to increase amount of physical activity.;Long Term: Exercising regularly at least 3-5 days a week.       Increase Strength and Stamina  Yes       Intervention  Provide advice, education, support and counseling about physical activity/exercise needs.;Develop an individualized exercise prescription for aerobic and resistive training based on initial evaluation findings, risk stratification, comorbidities and participant's personal goals.       Expected Outcomes  Short Term: Increase workloads from initial exercise prescription for resistance,  speed, and METs.;Long Term: Improve cardiorespiratory fitness, muscular endurance and strength as measured by increased METs and functional capacity (6MWT);Short Term: Perform resistance training exercises routinely during rehab and add in resistance training at home       Able to understand and use rate of perceived exertion (RPE) scale  Yes       Intervention  Provide education and explanation on how to use RPE scale       Expected Outcomes  Short Term: Able to use RPE daily in rehab to express subjective intensity level;Long Term:  Able to use RPE to guide intensity level when exercising independently       Able to understand and use Dyspnea scale  Yes       Intervention  Provide education and explanation on how to use Dyspnea scale       Expected Outcomes  Short Term: Able to use Dyspnea scale daily in rehab to express subjective sense of shortness of breath during exertion;Long Term: Able to use Dyspnea scale to guide intensity level when exercising independently       Knowledge and understanding of Target Heart Rate Range (THRR)  Yes       Intervention  Provide education and explanation of THRR including how the numbers were predicted and where they are located for reference       Expected Outcomes  Short Term: Able to state/look up THRR;Long Term: Able to use THRR to govern intensity when exercising independently;Short Term: Able to use daily as guideline for intensity in rehab       Able to check pulse independently  Yes       Intervention  Provide education and demonstration on how to check pulse in carotid and radial arteries.;Review the importance of being able to check your own pulse for safety during independent exercise       Expected Outcomes  Short Term: Able to explain why pulse checking is important during independent exercise;Long Term: Able to check pulse independently and accurately       Understanding of Exercise Prescription  Yes       Intervention  Provide education,  explanation, and written materials on patient's individual exercise prescription       Expected Outcomes  Short Term: Able to explain program exercise prescription;Long Term: Able to explain home exercise prescription to exercise independently          Exercise Goals Re-Evaluation: Exercise Goals Re-Evaluation    Row Name 12/19/17 0742 12/23/17 0816 12/26/17 1542 01/02/18 0853 01/09/18 1241     Exercise Goal Re-Evaluation   Exercise Goals Review  Increase Physical Activity;Increase Strength and Stamina;Able to understand and use rate of perceived exertion (RPE) scale;Knowledge and understanding of Target Heart Rate Range (THRR);Understanding of  Exercise Prescription  Increase Physical Activity;Increase Strength and Stamina;Able to understand and use rate of perceived exertion (RPE) scale;Knowledge and understanding of Target Heart Rate Range (THRR);Able to check pulse independently;Understanding of Exercise Prescription  Increase Physical Activity;Increase Strength and Stamina;Understanding of Exercise Prescription  Increase Physical Activity;Increase Strength and Stamina;Understanding of Exercise Prescription;Able to understand and use rate of perceived exertion (RPE) scale;Knowledge and understanding of Target Heart Rate Range (THRR)  Increase Physical Activity;Increase Strength and Stamina;Understanding of Exercise Prescription   Comments  Reviewed RPE scale, THR and program prescription with pt today.  Pt voiced understanding and was given a copy of goals to take home.   Reviewed home exercise with pt today.  Pt plans to walk and join gym for exercise.  He is trying to decide between [facility] and [employer].  Reviewed THR, pulse, RPE, sign and symptoms, and when to call 911 or MD.  Also discussed weather considerations and indoor options.  Pt voiced understanding.  Christopher [name] is off to a good start in rehab.  He is still taking some rest breaks on the treadmill because of his back and knees, but overall  improving.  We will start to increase his seated equipment and continue to monitor his progress.   Christopher [name] is still taking breaks on the TM but has increased his walking time to 6 min before a break is needed. His main limiting factor is his knee pain and he is planning on eventually getting a knee replacement once he gets cleared by his cardiologist.   Christopher [name] continues to do well in rehab.  He is working on getting better at the treadmill and is up to level 3 on the NuStep.  We will continue to monitor his progression.    Expected Outcomes  Short: Use RPE daily to regulate intensity. Long: Follow program prescription in THR.  Short: Start to add in walking at home.  Long: Join gym  Short: Increase workloads on seated equipment.  Long: Continue to increase strength and stamina.   Short: continue to increase walking time intervals on TM before rest break. Aim for 7-8 min of walking before a rest break is needed. Long: Continue with healhty lifestyle choices to stablize heart condition so that he can get his knee replacement.   Short: Increase workload on XR.  Long: Continue to increase strength and stamina.    [place] Name 01/25/18 0904             Exercise Goal Re-Evaluation   Exercise Goals Review  Increase Physical Activity;Increase Strength and Stamina;Understanding of Exercise Prescription       Comments  Christopher [name] is nearing graduation.  He was not able to improve his walk test due to his knee pain. He is feeling stronger overall and has more stamina, but still struggles with his knee and SOB.  He is planning to continue to exercise at the [facility], leading up to his knee replacement.  He feels that he is now ready, strength wise, for his surgery.       Expected Outcomes  Short: Graduation Long: Continue to exericse to maintain strength and stamina.           Nutrition & Weight - Outcomes: Pre Biometrics - 12/12/17 1241      Pre Biometrics   Height  6' 2.5" (1.892 m)    Weight  (!) 322 lb 11.2 oz (146.4 kg)     Waist Circumference  51 inches    Hip Circumference  47 inches  Waist to Hip Ratio  1.09 %    BMI (Calculated)  40.89    Single Leg Stand  2.94 seconds      Post Biometrics - 01/25/18 7408       Post  Biometrics   Height  6' 2.5" (1.892 m)    Weight  (!) 328 lb (148.8 kg)    Waist Circumference  53 inches    Hip Circumference  48 inches    Waist to Hip Ratio  1.1 %    BMI (Calculated)  41.56    Single Leg Stand  1.42 seconds       Nutrition: Nutrition Therapy & Goals - 12/26/17 1054      Nutrition Therapy   Diet  DASH    Drug/Food Interactions  Statins/Certain Fruits    Protein (specify units)  9oz    Fiber  35 grams    Whole Grain Foods  3 servings   chooses whole grains occasionally   Saturated Fats  15 max. grams    Fruits and Vegetables  6 servings/day   8 Ideal; eats fruits and vegetables each day   Sodium  1500 grams      Personal Nutrition Goals   Nutrition Goal  Limit late night snacking, espically on work days when you have a late dinner.    Personal Goal #2  Continue to work on reducing intake of fried foods    Personal Goal #3  Increase the amount of sugar free/ calorie free beverages you drink by at least 1 glass per day    Comments  He works in food service 4 days/week and does not have control over the amount of sodium added to the lunches he heats while there. Tries to choose a meat + 2 vegetables for lunches at work. He does not add salt to food but his son heavily salts meals at home which he sometimes eats. On his off days he is able to cook more meals for himself such as grilled chicken sandwiches. Eats fruit and peanut butter crackers often for snacks. Does not always eat breakfast but occasionally does enjoy eggs and biscuits. He is working to reduce sugar sweetened beverage intake IE sweet tea      Intervention Plan   Intervention  Nutrition handout(s) given to patient.;Prescribe, educate and counsel regarding individualized specific dietary  modifications aiming towards targeted core components such as weight, hypertension, lipid management, diabetes, heart failure and other comorbidities.   general heart healthy eating handout provided   Expected Outcomes  Short Term Goal: Understand basic principles of dietary content, such as calories, fat, sodium, cholesterol and nutrients.;Short Term Goal: A plan has been developed with personal nutrition goals set during dietitian appointment.;Long Term Goal: Adherence to prescribed nutrition plan.       Nutrition Discharge: Nutrition Assessments - 01/30/18 1616      MEDFICTS Scores   Pre Score  40    Post Score  52    Score Difference  12       Education Questionnaire Score: Knowledge Questionnaire Score - 01/30/18 1616      Knowledge Questionnaire Score   Pre Score  21/26    Post Score  26/26       Goals reviewed with patient; copy given to patient.

## 2018-02-01 NOTE — Progress Notes (Signed)
Daily Session Note  Patient Details  Name: Christopher [name] MRN: [id number] Date of Birth: 1946/01/20 Referring Provider:     Cardiac Rehab from 12/12/2017 in [facility]  Referring Provider  Lujean Amel MD      Encounter Date: 02/01/2018  Check In: Session Check In - 02/01/18 0750      Check-In   Supervising physician immediately available to respond to emergencies  See telemetry face sheet for immediately available ER MD    Location  [facility]-Cardiac & Pulmonary Rehab    Staff Present  Heath Lark, RN, BSN, CCRP;Jessica Betsy [place]ne, MA, RCEP, CCRP, Exercise Physiologist;Trissa Molina Tessie Fass RCP,RRT,BSRT    Medication changes reported      No    Fall or balance concerns reported     No    Warm-up and Cool-down  Performed on first and last piece of equipment    Resistance Training Performed  Yes    VAD Patient?  No    PAD/SET Patient?  No      Pain Assessment   Currently in Pain?  No/denies          Social History   Tobacco Use  Smoking Status Former Smoker  Smokeless Tobacco Never Used    Goals Met:  Independence with exercise equipment Exercise tolerated well No report of cardiac concerns or symptoms Strength training completed today  Goals Unmet:  Not Applicable  Comments: Pt able to follow exercise prescription today without complaint.  Will continue to monitor for progression.    Dr. Emily Filbert is Medical Director for [facility].

## 2018-02-03 DIAGNOSIS — I5022 Chronic systolic (congestive) heart failure: Secondary | ICD-10-CM

## 2018-02-03 NOTE — Progress Notes (Signed)
Cardiac Individual Treatment Plan  Patient Details  Name: Christopher [name] MRN: [id number] Date of Birth: [birth year] Referring Provider:     Cardiac Rehab from 12/12/2017 in [facility] Cardiac and Pulmonary Rehab  Referring Provider  Lujean Amel MD      Initial Encounter Date:    Cardiac Rehab from 12/12/2017 in [facility] Cardiac and Pulmonary Rehab  Date  12/12/17      Visit Diagnosis: Heart failure, chronic systolic ([place])  Patient's Home Medications on Admission:  Current Outpatient Medications:  .  amLODipine (NORVASC) 5 MG tablet, TAKE 1 TABLET(5 MG) BY MOUTH DAILY, Disp: 90 tablet, Rfl: 1 .  Cholecalciferol (VITAMIN D) 2000 units tablet, Take by mouth., Disp: , Rfl:  .  clopidogrel (PLAVIX) 75 MG tablet, TAKE 1 TABLET BY MOUTH EVERY DAY AT 6:00AM, Disp: 90 tablet, Rfl: 1 .  diclofenac sodium (VOLTAREN) 1 % GEL, Apply 4 g topically 4 (four) times daily. (Patient taking differently: Apply 4 g topically 4 (four) times daily. Dr Marry Guan), Disp: 1 Tube, Rfl: 1 .  etodolac (LODINE) 500 MG tablet, Take 1 tablet (500 mg total) by mouth 2 (two) times daily., Disp: 180 tablet, Rfl: 1 .  fenofibrate 160 MG tablet, Take 1 tablet (160 mg total) by mouth daily., Disp: 90 tablet, Rfl: 1 .  metoprolol succinate (TOPROL-XL) 100 MG 24 hr tablet, Take with or immediately following a meal., Disp: 90 tablet, Rfl: 1 .  niacin 500 MG CR capsule, Take 1 capsule (500 mg total) by mouth daily at 6 (six) AM., Disp: 90 capsule, Rfl: 1 .  ramipril (ALTACE) 10 MG capsule, TAKE 1 CAPSULE(10 MG) BY MOUTH DAILY, Disp: 90 capsule, Rfl: 1 .  simvastatin (ZOCOR) 40 MG tablet, One a day, Disp: 90 tablet, Rfl: 1 .  torsemide (DEMADEX) 10 MG tablet, Take 1 tablet by mouth daily. Dr Clayborn Bigness, Disp: , Rfl:   Past Medical History: Past Medical History:  Diagnosis Date  . Hyperlipidemia   . Hypertension   . Myocardial infarction ([place])     Tobacco Use: Social History   Tobacco Use  Smoking Status Former Smoker    Smokeless Tobacco Never Used    Labs: Recent [employer] for ITP Cardiac and Pulmonary Rehab Latest Ref Rng & Units 10/22/2014 08/19/2015 12/16/2016 01/24/2018   Cholestrol 100 - 199 mg/dL 122 121 145 104   LDLCALC 0 - 99 mg/dL 65 70 73 61   HDL >39 mg/dL 23(L) 22(L) 24(L) 21(L)   Trlycerides 0 - 149 mg/dL 169(H) 146 240(H) 112       Exercise Target Goals: Exercise Program Goal: Individual exercise prescription set using results from initial 6 min walk test and THRR while considering  patient's activity barriers and safety.   Exercise Prescription Goal: Initial exercise prescription builds to 30-45 minutes a day of aerobic activity, 2-3 days per week.  Home exercise guidelines will be given to patient during program as part of exercise prescription that the participant will acknowledge.  Activity Barriers & Risk Stratification: Activity Barriers & Cardiac Risk Stratification - 12/12/17 1236      Activity Barriers & Cardiac Risk Stratification   Activity Barriers  Arthritis;Right Knee Replacement;Joint Problems;Shortness of Breath;Decreased Ventricular Function;Muscular Weakness;Deconditioning;Balance Concerns   needs left knee replacement   Cardiac Risk Stratification  High       6 Minute Walk: 6 Minute Walk    Row Name 12/12/17 1235 01/25/18 0900       6 Minute Walk  Phase  Initial  Discharge    Distance  694 feet  430 feet    Distance % Change  -  -38 %    Distance Feet Change  -  -264 ft    Walk Time  4.37 minutes  2.87 minutes    # of Rest Breaks  3 rest for knee pain: 27 sec, 38 sec, 33 sec  4 15 sec, 33 sec, 1:22, 58 sec    MPH  1.8  1.7    METS  1.94  0.6    RPE  17  15    Perceived Dyspnea   1  3    VO2 Peak  6.81  2.1    Symptoms  Yes (comment)  Yes (comment)    Comments  L knee pain 8/10 (made gait a little wobbly), SOB  knee pain 10/10, SOB    Resting HR  99 bpm  70 bpm    Resting BP  132/64  126/64    Resting Oxygen Saturation   99 %  93  %    Exercise Oxygen Saturation  during 6 min walk  97 %  90 %    Max Ex. HR  156 bpm  84 bpm    Max Ex. BP  166/72  174/84    2 Minute Post BP  144/66  -       Oxygen Initial Assessment:   Oxygen Re-Evaluation:   Oxygen Discharge (Final Oxygen Re-Evaluation):   Initial Exercise Prescription: Initial Exercise Prescription - 12/12/17 1200      Date of Initial Exercise RX and Referring Provider   Date  12/12/17    Referring Provider  Lujean Amel MD      Treadmill   MPH  1.2    Grade  0.5    Minutes  15    METs  2      NuStep   Level  1    SPM  80    Minutes  15    METs  2      REL-XR   Level  1    Speed  50    Minutes  15    METs  2      Prescription Details   Frequency (times per week)  3    Duration  Progress to 45 minutes of aerobic exercise without signs/symptoms of physical distress      Intensity   THRR 40-80% of Max Heartrate  119-140    Ratings of Perceived Exertion  11-13    Perceived Dyspnea  0-4      Progression   Progression  Continue to progress workloads to maintain intensity without signs/symptoms of physical distress.      Resistance Training   Training Prescription  Yes    Weight  3 lbs    Reps  10-15       Perform Capillary Blood Glucose checks as needed.  Exercise Prescription Changes: Exercise Prescription Changes    Row Name 12/12/17 1200 12/23/17 0800 12/26/17 1500 01/09/18 1200 01/25/18 0900     Response to Exercise   Blood Pressure (Admit)  132/64  -  124/84  110/80  128/68   Blood Pressure (Exercise)  166/72  -  138/82  136/64  138/84   Blood Pressure (Exit)  144/66  -  110/78  124/64  134/80   Heart Rate (Admit)  99 bpm  -  107 bpm  115 bpm  108 bpm   Heart Rate (Exercise)  156 bpm  -  125 bpm  125 bpm  125 bpm   Heart Rate (Exit)  98 bpm  -  103 bpm  85 bpm  103 bpm   Oxygen Saturation (Admit)  99 %  -  -  -  -   Oxygen Saturation (Exercise)  97 %  -  -  -  -   Rating of Perceived Exertion (Exercise)  17  -  _0 Perceived Dyspnea (Exercise)  1  -  -  -  -   Symptoms  knee pain 8/10, SOB  -  knee pain on treadmill  knee pain on treadmill  knee pain on treadmill   Comments  walk test results  -  -  -  -   Duration  -  -  Progress to 45 minutes of aerobic exercise without signs/symptoms of physical distress  Progress to 45 minutes of aerobic exercise without signs/symptoms of physical distress taking rest breaks on treadmill only  Progress to 45 minutes of aerobic exercise without signs/symptoms of physical distress taking rest breaks on treadmill only   Intensity  -  -  THRR unchanged  THRR unchanged  THRR unchanged     Progression   Progression  -  -  Continue to progress workloads to maintain intensity without signs/symptoms of physical distress.  Continue to progress workloads to maintain intensity without signs/symptoms of physical distress.  Continue to progress workloads to maintain intensity without signs/symptoms of physical distress.   Average METs  -  -  1.81  1.94  2.08     Resistance Training   Training Prescription  -  -  Yes  Yes  Yes   Weight  -  -  3 lbs  3 lbs  3 lbs   Reps  -  -  10-15  10-15  10-15     Interval Training   Interval Training  -  -  No  No  No     Treadmill   MPH  -  -  _1 Grade  -  -  0.5  0.5  0.5   Minutes  -  -  _2 METs  -  -  1.83  1.83  1.83     NuStep   Level  -  -  _3 Minutes  -  -  _4 METs  -  -  2.1  2.4  2.9     REL-XR   Level  -  -  _5 Minutes  -  -  _6 METs  -  -  1.5  1.6  1.5     Home Exercise Plan   Plans to continue exercise at  -  Home (comment) walking, join gym ([employer] or [employer])  Home (comment) walking, join gym ([employer] or [employer])  Home (comment) walking, join gym ([employer] or [employer])  Home (comment) walking, join gym ([employer] or [employer])   Frequency  -  Add 2 additional days to program exercise sessions.  Add 2 additional days to program exercise sessions.  Add 2 additional  days to program exercise sessions.  Add 2 additional days to program exercise sessions.   Initial Home Exercises Provided  -  12/23/17  12/23/17  12/23/17  12/23/17  Exercise Comments: Exercise Comments    Row Name 12/19/17 0263 02/03/18 0929         Exercise Comments  First full day of exercise!  Patient was oriented to gym and equipment including functions, settings, policies, and procedures.  Patient's individual exercise prescription and treatment plan were reviewed.  All starting workloads were established based on the results of the 6 minute walk test done at initial orientation visit.  The plan for exercise progression was also introduced and progression will be customized based on patient's performance and goals.   Doil graduated today from  rehab with 36 sessions completed.  Details of the patient's exercise prescription and what He needs to do in order to continue the prescription and progress were discussed with patient.  Patient was given a copy of prescription and goals.  Patient verbalized understanding.  Gleen plans to continue to exercise by walking as he can.         Exercise Goals and Review: Exercise Goals    Row Name 12/12/17 1240             Exercise Goals   Increase Physical Activity  Yes       Intervention  Provide advice, education, support and counseling about physical activity/exercise needs.;Develop an individualized exercise prescription for aerobic and resistive training based on initial evaluation findings, risk stratification, comorbidities and participant's personal goals.       Expected Outcomes  Short Term: Attend rehab on a regular basis to increase amount of physical activity.;Long Term: Add in home exercise to make exercise part of routine and to increase amount of physical activity.;Long Term: Exercising regularly at least 3-5 days a week.       Increase Strength and Stamina  Yes       Intervention  Provide advice, education, support and  counseling about physical activity/exercise needs.;Develop an individualized exercise prescription for aerobic and resistive training based on initial evaluation findings, risk stratification, comorbidities and participant's personal goals.       Expected Outcomes  Short Term: Increase workloads from initial exercise prescription for resistance, speed, and METs.;Long Term: Improve cardiorespiratory fitness, muscular endurance and strength as measured by increased METs and functional capacity (6MWT);Short Term: Perform resistance training exercises routinely during rehab and add in resistance training at home       Able to understand and use rate of perceived exertion (RPE) scale  Yes       Intervention  Provide education and explanation on how to use RPE scale       Expected Outcomes  Short Term: Able to use RPE daily in rehab to express subjective intensity level;Long Term:  Able to use RPE to guide intensity level when exercising independently       Able to understand and use Dyspnea scale  Yes       Intervention  Provide education and explanation on how to use Dyspnea scale       Expected Outcomes  Short Term: Able to use Dyspnea scale daily in rehab to express subjective sense of shortness of breath during exertion;Long Term: Able to use Dyspnea scale to guide intensity level when exercising independently       Knowledge and understanding of Target Heart Rate Range (THRR)  Yes       Intervention  Provide education and explanation of THRR including how the numbers were predicted and where they are located for reference       Expected Outcomes  Short Term: Able to state/look  up THRR;Long Term: Able to use THRR to govern intensity when exercising independently;Short Term: Able to use daily as guideline for intensity in rehab       Able to check pulse independently  Yes       Intervention  Provide education and demonstration on how to check pulse in carotid and radial arteries.;Review the importance of  being able to check your own pulse for safety during independent exercise       Expected Outcomes  Short Term: Able to explain why pulse checking is important during independent exercise;Long Term: Able to check pulse independently and accurately       Understanding of Exercise Prescription  Yes       Intervention  Provide education, explanation, and written materials on patient's individual exercise prescription       Expected Outcomes  Short Term: Able to explain program exercise prescription;Long Term: Able to explain home exercise prescription to exercise independently          Exercise Goals Re-Evaluation : Exercise Goals Re-Evaluation    Row Name 12/19/17 0742 12/23/17 0816 12/26/17 1542 01/02/18 0853 01/09/18 1241     Exercise Goal Re-Evaluation   Exercise Goals Review  Increase Physical Activity;Increase Strength and Stamina;Able to understand and use rate of perceived exertion (RPE) scale;Knowledge and understanding of Target Heart Rate Range (THRR);Understanding of Exercise Prescription  Increase Physical Activity;Increase Strength and Stamina;Able to understand and use rate of perceived exertion (RPE) scale;Knowledge and understanding of Target Heart Rate Range (THRR);Able to check pulse independently;Understanding of Exercise Prescription  Increase Physical Activity;Increase Strength and Stamina;Understanding of Exercise Prescription  Increase Physical Activity;Increase Strength and Stamina;Understanding of Exercise Prescription;Able to understand and use rate of perceived exertion (RPE) scale;Knowledge and understanding of Target Heart Rate Range (THRR)  Increase Physical Activity;Increase Strength and Stamina;Understanding of Exercise Prescription   Comments  Reviewed RPE scale, THR and program prescription with pt today.  Pt voiced understanding and was given a copy of goals to take home.   Reviewed home exercise with pt today.  Pt plans to walk and join gym for exercise.  He is trying  to decide between [facility] and [employer].  Reviewed THR, pulse, RPE, sign and symptoms, and when to call 911 or MD.  Also discussed weather considerations and indoor options.  Pt voiced understanding.  Christopher [name] is off to a good start in rehab.  He is still taking some rest breaks on the treadmill because of his back and knees, but overall improving.  We will start to increase his seated equipment and continue to monitor his progress.   Christopher [name] is still taking breaks on the TM but has increased his walking time to 6 min before a break is needed. His main limiting factor is his knee pain and he is planning on eventually getting a knee replacement once he gets cleared by his cardiologist.   Christopher [name] continues to do well in rehab.  He is working on getting better at the treadmill and is up to level 3 on the NuStep.  We will continue to monitor his progression.    Expected Outcomes  Short: Use RPE daily to regulate intensity. Long: Follow program prescription in THR.  Short: Start to add in walking at home.  Long: Join gym  Short: Increase workloads on seated equipment.  Long: Continue to increase strength and stamina.   Short: continue to increase walking time intervals on TM before rest break. Aim for 7-8 min of walking before a rest break  is needed. Long: Continue with healhty lifestyle choices to stablize heart condition so that he can get his knee replacement.   Short: Increase workload on XR.  Long: Continue to increase strength and stamina.    [place] Name 01/25/18 0904             Exercise Goal Re-Evaluation   Exercise Goals Review  Increase Physical Activity;Increase Strength and Stamina;Understanding of Exercise Prescription       Comments  Christopher [name] is nearing graduation.  He was not able to improve his walk test due to his knee pain. He is feeling stronger overall and has more stamina, but still struggles with his knee and SOB.  He is planning to continue to exercise at the [facility], leading up to his knee replacement.   He feels that he is now ready, strength wise, for his surgery.       Expected Outcomes  Short: Graduation Long: Continue to exericse to maintain strength and stamina.           Discharge Exercise Prescription (Final Exercise Prescription Changes): Exercise Prescription Changes - 01/25/18 0900      Response to Exercise   Blood Pressure (Admit)  128/68    Blood Pressure (Exercise)  138/84    Blood Pressure (Exit)  134/80    Heart Rate (Admit)  108 bpm    Heart Rate (Exercise)  125 bpm    Heart Rate (Exit)  103 bpm    Rating of Perceived Exertion (Exercise)  13    Symptoms  knee pain on treadmill    Duration  Progress to 45 minutes of aerobic exercise without signs/symptoms of physical distress   taking rest breaks on treadmill only   Intensity  THRR unchanged      Progression   Progression  Continue to progress workloads to maintain intensity without signs/symptoms of physical distress.    Average METs  2.08      Resistance Training   Training Prescription  Yes    Weight  3 lbs    Reps  10-15      Interval Training   Interval Training  No      Treadmill   MPH  1    Grade  0.5    Minutes  15    METs  1.83      NuStep   Level  4    Minutes  15    METs  2.9      REL-XR   Level  1    Minutes  15    METs  1.5      Home Exercise Plan   Plans to continue exercise at  Home (comment)   walking, join gym ([employer] or [employer])   Frequency  Add 2 additional days to program exercise sessions.    Initial Home Exercises Provided  12/23/17       Nutrition:  Target Goals: Understanding of nutrition guidelines, daily intake of sodium <1535m, cholesterol <2018m calories 30% from fat and 7% or less from saturated fats, daily to have 5 or more servings of fruits and vegetables.  Biometrics: Pre Biometrics - 12/12/17 1241      Pre Biometrics   Height  6' 2.5" (1.892 m)    Weight  (!) 322 lb 11.2 oz (146.4 kg)    Waist Circumference  51 inches    Hip Circumference  47  inches    Waist to Hip Ratio  1.09 %    BMI (Calculated)  40.89  Single Leg Stand  2.94 seconds      Post Biometrics - 01/25/18 0086       Post  Biometrics   Height  6' 2.5" (1.892 m)    Weight  (!) 328 lb (148.8 kg)    Waist Circumference  53 inches    Hip Circumference  48 inches    Waist to Hip Ratio  1.1 %    BMI (Calculated)  41.56    Single Leg Stand  1.42 seconds       Nutrition Therapy Plan and Nutrition Goals: Nutrition Therapy & Goals - 12/26/17 1054      Nutrition Therapy   Diet  DASH    Drug/Food Interactions  Statins/Certain Fruits    Protein (specify units)  9oz    Fiber  35 grams    Whole Grain Foods  3 servings   chooses whole grains occasionally   Saturated Fats  15 max. grams    Fruits and Vegetables  6 servings/day   8 Ideal; eats fruits and vegetables each day   Sodium  1500 grams      Personal Nutrition Goals   Nutrition Goal  Limit late night snacking, espically on work days when you have a late dinner.    Personal Goal #2  Continue to work on reducing intake of fried foods    Personal Goal #3  Increase the amount of sugar free/ calorie free beverages you drink by at least 1 glass per day    Comments  He works in food service 4 days/week and does not have control over the amount of sodium added to the lunches he heats while there. Tries to choose a meat + 2 vegetables for lunches at work. He does not add salt to food but his son heavily salts meals at home which he sometimes eats. On his off days he is able to cook more meals for himself such as grilled chicken sandwiches. Eats fruit and peanut butter crackers often for snacks. Does not always eat breakfast but occasionally does enjoy eggs and biscuits. He is working to reduce sugar sweetened beverage intake IE sweet tea      Intervention Plan   Intervention  Nutrition handout(s) given to patient.;Prescribe, educate and counsel regarding individualized specific dietary modifications aiming towards  targeted core components such as weight, hypertension, lipid management, diabetes, heart failure and other comorbidities.   general heart healthy eating handout provided   Expected Outcomes  Short Term Goal: Understand basic principles of dietary content, such as calories, fat, sodium, cholesterol and nutrients.;Short Term Goal: A plan has been developed with personal nutrition goals set during dietitian appointment.;Long Term Goal: Adherence to prescribed nutrition plan.       Nutrition Assessments: Nutrition Assessments - 01/30/18 1616      MEDFICTS Scores   Pre Score  40    Post Score  52    Score Difference  12       Nutrition Goals Re-Evaluation: Nutrition Goals Re-Evaluation    Row Name 12/26/17 1112 01/30/18 0935           Goals   Nutrition Goal  Increase the amount of sugar free/ calorie free beverages you drink by at least 1 glass per day  Limit late night snacking especially on days when you eat dinner late; Continue to work on reducing intake of fried foods; Increase the amount of sugar free/ calorie free beverages you drink by at least 1 glass per day  Comment  He reports adequate fluid intake to be a life-long struggle. He also gets cramps often  He has been trying sugar free beverages like "Ice" brand and low sugar Gatorade which has helped him to drink more fluids in general day to day. He has been decreasing quantities of snacks and may choose options like a yogurt or a serving or two of crackers. He has also significantly decreased his fried food intake.      Expected Outcome  He will drink at least one additional glass of water per day, ideally drinking fluids that are sugar and calorie free frequently throughout the day long-term  Continue to choose SF beverage and non-fried food most often. Limit late night snacking and choose food options that have nutritional value when choosing to snack IE yogurt rather than chips. Continue to work on increasing daily fluid intake         Personal Goal #2 Re-Evaluation   Personal Goal #2  Limit late night snacking, especially on work days when you eat a late dinner  -        Personal Goal #3 Re-Evaluation   Personal Goal #3  Continue to work on reducing intake of fried foods  -         Nutrition Goals Discharge (Final Nutrition Goals Re-Evaluation): Nutrition Goals Re-Evaluation - 01/30/18 0935      Goals   Nutrition Goal  Limit late night snacking especially on days when you eat dinner late; Continue to work on reducing intake of fried foods; Increase the amount of sugar free/ calorie free beverages you drink by at least 1 glass per day    Comment  He has been trying sugar free beverages like "Ice" brand and low sugar Gatorade which has helped him to drink more fluids in general day to day. He has been decreasing quantities of snacks and may choose options like a yogurt or a serving or two of crackers. He has also significantly decreased his fried food intake.    Expected Outcome  Continue to choose SF beverage and non-fried food most often. Limit late night snacking and choose food options that have nutritional value when choosing to snack IE yogurt rather than chips. Continue to work on increasing daily fluid intake       Psychosocial: Target Goals: Acknowledge presence or absence of significant depression and/or stress, maximize coping skills, provide positive support system. Participant is able to verbalize types and ability to use techniques and skills needed for reducing stress and depression.   Initial Review & Psychosocial Screening: Initial Psych Review & Screening - 12/12/17 1232      Initial Review   Current issues with  Current Stress Concerns    Source of Stress Concerns  Unable to perform yard/household activities    Comments  Christopher [name] has had a right knee replacement and needs his left one done. This causes some discomfort while doing daily activities. 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 He says he gets around pretty well if he has a cart, but wants to get stronger doing other things around the house and hobbies. His son, daughter-in-law, and granddaughter (25years old) moved in with him ever since his wife passed away a few years ago. He loves having his family around. He is starting to notice a good difference since being put on fluid pills and is motivated to work hard  so he can get his other knee replaced.  Family Dynamics   Good Support System?  Yes   son, daughter in law, daughter     Barriers   Psychosocial barriers to participate in program  There are no identifiable barriers or psychosocial needs.;The patient should benefit from training in stress management and relaxation.      Screening Interventions   Interventions  Encouraged to exercise;Program counselor consult;To provide support and resources with identified psychosocial needs;Provide feedback about the scores to participant    Expected Outcomes  Short Term goal: Utilizing psychosocial counselor, staff and physician to assist with identification of specific Stressors or current issues interfering with healing process. Setting desired goal for each stressor or current issue identified.;Long Term Goal: Stressors or current issues are controlled or eliminated.;Long Term goal: The participant improves quality of Life and PHQ9 Scores as seen by post scores and/or verbalization of changes;Short Term goal: Identification and review with participant of any Quality of Life or Depression concerns found by scoring the questionnaire.       Quality of Life Scores:  Quality of Life - 01/30/18 1616      Quality of Life   Select  Quality of Life      Quality of Life Scores   Health/Function Pre  18.67 %    Health/Function Post  20.23 %    Health/Function % Change  8.36 %    Socioeconomic Pre  25.21 %    Socioeconomic Post  22.63 %    Socioeconomic % Change   -10.23 %    Psych/Spiritual Pre  26.57 %     Psych/Spiritual Post  27.21 %    Psych/Spiritual % Change  2.41 %    Family Pre  28.5 %    Family Post  21.9 %    Family % Change  -23.16 %    GLOBAL Pre  22.92 %    GLOBAL Post  22.41 %    GLOBAL % Change  -2.23 %      Scores of 19 and below usually indicate a poorer quality of life in these areas.  A difference of  2-3 points is a clinically meaningful difference.  A difference of 2-3 points in the total score of the Quality of Life Index has been associated with significant improvement in overall quality of life, self-image, physical symptoms, and general health in studies assessing change in quality of life.  PHQ-9: Recent Review Flowsheet Data    Depression screen [facility] 2/9 01/30/2018 01/24/2018 12/12/2017 05/20/2017 12/16/2016   Decreased Interest 1 0 0 0 0   Down, Depressed, Hopeless 0 0 0 0 0   PHQ - 2 Score 1 0 0 0 0   Altered sleeping 2 0 0 0 0   Tired, decreased energy 1 0 _0 Change in appetite 1 0 0 0 0   Feeling bad or failure about yourself  0 0 0 0 0   Trouble concentrating 0 0 0 0 0   Moving slowly or fidgety/restless 0 0 0 0 0   Suicidal thoughts 0 0 0 0 0   PHQ-9 Score 5 0 _1 Difficult doing work/chores Not difficult at all - Not difficult at all Not difficult at all Not difficult at all     Interpretation of Total Score  Total Score Depression Severity:  1-4 = Minimal depression, 5-9 = Mild depression, 10-14 = Moderate depression, 15-19 = Moderately severe depression, 20-27 = Severe depression   Psychosocial Evaluation and Intervention:  Psychosocial Evaluation - 12/19/17 0954      Psychosocial Evaluation & Interventions   Interventions  Encouraged to exercise with the program and follow exercise prescription    Comments  Counselor met with Mr. Gentzler Christopher [name]) today for initial psychosocial evaluation.  He has had some cardiac issues and was recommended to attend this program by his Dr.  Barnabas [name] also has some chronic knee problems and is awaiting his second  knee to be replaced following completion of this program.  He has a strong support system with a son and his family who live in the same home with him and a daughter and sister locally.  He is also actively involved in his local church.  Christopher [name] reports sleeping well; eating well and denies a history or current symptoms of depression or anxiety.  He reports his mood being typically positive.  Christopher [name] states his primary stress is his health at this time.  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  Christopher [name] has goals to improve his health with his heart; exercise more consistently and lose some weight while in this program.  Staff wil follow with him.     Expected Outcomes  Short:  Christopher [name] will meet with the dietician to address his weight loss goals.  He will exercise consistently with the CR program treatment plan to improve his heart health and develop a routine.  Long:  Christopher [name] will exercise for his overall health and be strong enough to have his knee replacement surgery.      Continue Psychosocial Services   Follow up required by staff       Psychosocial Re-Evaluation: Psychosocial Re-Evaluation    Row Name 01/02/18 [id number] 01/11/18 0944 01/25/18 0909         Psychosocial Re-Evaluation   Current issues with  None Identified  Current Sleep Concerns  -     Comments  Patient reported no changes in his psycosocial health. He reports having no stress concerns and continues to have a strong support system.   Counselor follow up with Christopher [name] today reporting positive benefits from this program recognized with more energy; eating better; feeling more confident in his ability to exercise regularly; and improved mood.  He continues to have intermittent sleep issues due to taking fluid pills and having to go to the bathroom at night.  But he reports continuing to get at least 6 hours of sleep overall.  Counselor commended Christopher [name] for his progress made even though he has such  knee pain.  He is committed to exercising consistently and this is such a positive step for him.  He will continue to be followed.    Christopher [name] is doing better menatlly and has enjoyed the program.  He is set to graduate next week and plans to continue to exercise at the [facility].  He is gearing up for his knee replacement and feels that the program has helped him gain his strength back to prepare for the surgery.      Expected Outcomes  Short: Continue to attend Cardiac Rehab on a regular basis. Long: Continue a healthy lifestyle including exercise and stress managment techniques to mantain  low stress levels.  Short:  Christopher [name] will continue to exercise and eat more healthful for his overall health and mental health.  Long:  Christopher [name] will exercise in order to be strong enough for knee surgery in the future.    Short: Graduate. Long: Continue to  exercise to get ready for knee surgery.      Continue Psychosocial Services   Follow up required by staff  -  -        Psychosocial Discharge (Final Psychosocial Re-Evaluation): Psychosocial Re-Evaluation - 01/25/18 0909      Psychosocial Re-Evaluation   Comments  Christopher [name] is doing better [facility] and has enjoyed the program.  He is set to graduate next week and plans to continue to exercise at the [facility].  He is gearing up for his knee replacement and feels that the program has helped him gain his strength back to prepare for the surgery.     Expected Outcomes  Short: Graduate. Long: Continue to exercise to get ready for knee surgery.        Vocational Rehabilitation: Provide vocational rehab assistance to qualifying candidates.   Vocational Rehab Evaluation & Intervention: Vocational Rehab - 12/12/17 1232      Initial Vocational Rehab Evaluation & Intervention   Assessment shows need for Vocational Rehabilitation  No       Education: Education Goals: Education classes will be provided on a variety of topics geared toward better understanding of heart health and risk  factor modification. Participant will state understanding/return demonstration of topics presented as noted by education test scores.  Learning Barriers/Preferences: Learning Barriers/Preferences - 12/12/17 1232      Learning Barriers/Preferences   Learning Barriers  Exercise Concerns    Learning Preferences  Written Material       Education Topics:  AED/CPR: - Group verbal and written instruction with the use of models to demonstrate the basic use of the AED with the basic ABC's of resuscitation.   Cardiac Rehab from 02/01/2018 in [facility] Cardiac and Pulmonary Rehab  Date  01/11/18  Educator  SB  Instruction Review Code  1- Verbalizes Understanding      General Nutrition Guidelines/Fats and Fiber: -Group instruction provided by verbal, written material, models and posters to present the general guidelines for heart healthy nutrition. Gives an explanation and review of dietary fats and fiber.   Cardiac Rehab from 02/01/2018 in [facility] Cardiac and Pulmonary Rehab  Date  01/16/18  Educator  LB  Instruction Review Code  1- Verbalizes Understanding      Controlling Sodium/Reading Food Labels: -Group verbal and written material supporting the discussion of sodium use in heart healthy nutrition. Review and explanation with models, verbal and written materials for utilization of the food label.   Cardiac Rehab from 02/01/2018 in [facility] Cardiac and Pulmonary Rehab  Date  01/18/18  Educator  LB  Instruction Review Code  1- Verbalizes Understanding      Exercise Physiology & General Exercise Guidelines: - Group verbal and written instruction with models to review the exercise physiology of the cardiovascular system and associated critical values. Provides general exercise guidelines with specific guidelines to those with heart or lung disease.    Cardiac Rehab from 02/01/2018 in [facility] Cardiac and Pulmonary Rehab  Date  01/30/18  Educator  [facility]  Instruction Review Code  1- Verbalizes  Understanding      Aerobic Exercise & Resistance Training: - Gives group verbal and written instruction on the various components of exercise. Focuses on aerobic and resistive training programs and the benefits of this training and how to safely progress through these programs..   Cardiac Rehab from 02/01/2018 in [facility] Cardiac and Pulmonary Rehab  Date  02/01/18  Educator  [facility]  Instruction Review Code  1- Verbalizes Understanding  Flexibility, Balance, Mind/Body Relaxation: Provides group verbal/written instruction on the benefits of flexibility and balance training, including mind/body exercise modes such as yoga, pilates and tai chi.  Demonstration and skill practice provided.   Cardiac Rehab from 02/01/2018 in [facility] Cardiac and Pulmonary Rehab  Date  12/19/17  Educator  [facility]  Instruction Review Code  1- Verbalizes Understanding      Stress and Anxiety: - Provides group verbal and written instruction about the health risks of elevated stress and causes of high stress.  Discuss the correlation between heart/lung disease and anxiety and treatment options. Review healthy ways to manage with stress and anxiety.   Cardiac Rehab from 02/01/2018 in [facility] Cardiac and Pulmonary Rehab  Date  12/28/17  Educator  [facility]  Instruction Review Code  1- Verbalizes Understanding      Depression: - Provides group verbal and written instruction on the correlation between heart/lung disease and depressed mood, treatment options, and the stigmas associated with seeking treatment.   Anatomy & Physiology of the Heart: - Group verbal and written instruction and models provide basic cardiac anatomy and physiology, with the coronary electrical and arterial systems. Review of Valvular disease and Heart Failure   Cardiac Rehab from 02/01/2018 in [facility] Cardiac and Pulmonary Rehab  Date  12/26/17  Educator  SB  Instruction Review Code  1- Verbalizes Understanding      Cardiac Procedures: - Group verbal and  written instruction to review commonly prescribed medications for heart disease. Reviews the medication, class of the drug, and side effects. Includes the steps to properly store meds and maintain the prescription regimen. (beta blockers and nitrates)   Cardiac Rehab from 02/01/2018 in [facility] Cardiac and Pulmonary Rehab  Date  01/09/18  Educator  SB  Instruction Review Code  1- Verbalizes Understanding      Cardiac Medications I: - Group verbal and written instruction to review commonly prescribed medications for heart disease. Reviews the medication, class of the drug, and side effects. Includes the steps to properly store meds and maintain the prescription regimen.   Cardiac Rehab from 02/01/2018 in [facility] Cardiac and Pulmonary Rehab  Date  01/02/18  Educator  SB  Instruction Review Code  1- Verbalizes Understanding      Cardiac Medications II: -Group verbal and written instruction to review commonly prescribed medications for heart disease. Reviews the medication, class of the drug, and side effects. (all other drug classes)   Cardiac Rehab from 02/01/2018 in [facility] Cardiac and Pulmonary Rehab  Date  12/21/17  Educator  [facility]  Instruction Review Code  1- Verbalizes Understanding       Go Sex-Intimacy & Heart Disease, Get SMART - Goal Setting: - Group verbal and written instruction through game format to discuss heart disease and the return to sexual intimacy. Provides group verbal and written material to discuss and apply goal setting through the application of the S.M.A.R.T. Method.   Cardiac Rehab from 02/01/2018 in [facility] Cardiac and Pulmonary Rehab  Date  01/09/18  Educator  SB  Instruction Review Code  1- Verbalizes Understanding      Other Matters of the Heart: - Provides group verbal, written materials and models to describe Stable Angina and Peripheral Artery. Includes description of the disease process and treatment options available to the cardiac patient.   Cardiac Rehab from  02/01/2018 in [facility] Cardiac and Pulmonary Rehab  Date  12/26/17  Educator  SB  Instruction Review Code  1- Verbalizes Understanding      Exercise &  Equipment Safety: - Individual verbal instruction and demonstration of equipment use and safety with use of the equipment.   Cardiac Rehab from 02/01/2018 in [facility] Cardiac and Pulmonary Rehab  Date  12/12/17  Educator  [facility]  Instruction Review Code  1- Verbalizes Understanding      Infection Prevention: - Provides verbal and written material to individual with discussion of infection control including proper hand washing and proper equipment cleaning during exercise session.   Cardiac Rehab from 02/01/2018 in [facility] Cardiac and Pulmonary Rehab  Date  12/12/17  Educator  [facility]  Instruction Review Code  1- Verbalizes Understanding      Falls Prevention: - Provides verbal and written material to individual with discussion of falls prevention and safety.   Cardiac Rehab from 02/01/2018 in [facility] Cardiac and Pulmonary Rehab  Date  12/12/17  Educator  [facility]  Instruction Review Code  1- Verbalizes Understanding      Diabetes: - Individual verbal and written instruction to review signs/symptoms of diabetes, desired ranges of glucose level fasting, after meals and with exercise. Acknowledge that pre and post exercise glucose checks will be done for 3 sessions at entry of program.   Know Your Numbers and Risk Factors: -Group verbal and written instruction about important numbers in your health.  Discussion of what are risk factors and how they play a role in the disease process.  Review of Cholesterol, Blood Pressure, Diabetes, and BMI and the role they play in your overall health.   Cardiac Rehab from 02/01/2018 in [facility] Cardiac and Pulmonary Rehab  Date  12/21/17  Educator  [facility]  Instruction Review Code  1- Verbalizes Understanding      Sleep Hygiene: -Provides group verbal and written instruction about how sleep can affect your health.  Define  sleep hygiene, discuss sleep cycles and impact of sleep habits. Review good sleep hygiene tips.    Other: -Provides group and verbal instruction on various topics (see comments)   Knowledge Questionnaire Score: Knowledge Questionnaire Score - 01/30/18 1616      Knowledge Questionnaire Score   Pre Score  21/26    Post Score  26/26       Core Components/Risk Factors/Patient Goals at Admission: Personal Goals and Risk Factors at Admission - 12/12/17 1230      Core Components/Risk Factors/Patient Goals on Admission    Weight Management  Yes;Obesity;Weight Loss    Intervention  Weight Management: Develop a combined nutrition and exercise program designed to reach desired caloric intake, while maintaining appropriate intake of nutrient and fiber, sodium and fats, and appropriate energy expenditure required for the weight goal.;Weight Management: Provide education and appropriate resources to help participant work on and attain dietary goals.;Weight Management/Obesity: Establish reasonable short term and long term weight goals.;Obesity: Provide education and appropriate resources to help participant work on and attain dietary goals.    Admit Weight  322 lb 11.2 oz (146.4 kg)    Goal Weight: Short Term  317 lb (143.8 kg)    Goal Weight: Long Term  275 lb (124.7 kg)    Expected Outcomes  Short Term: Continue to assess and modify interventions until short term weight is achieved;Long Term: Adherence to nutrition and physical activity/exercise program aimed toward attainment of established weight goal;Weight Loss: Understanding of general recommendations for a balanced deficit meal plan, which promotes 1-2 lb weight loss per week and includes a negative energy balance of [phone] kcal/d;Understanding recommendations for meals to include 15-35% energy as protein, 25-35% energy from fat, 35-60%  energy from carbohydrates, less than 233m of dietary cholesterol, 20-35 gm of total fiber daily;Understanding  of distribution of calorie intake throughout the day with the consumption of 4-5 meals/snacks    Heart Failure  Yes    Intervention  Provide a combined exercise and nutrition program that is supplemented with education, support and counseling about heart failure. Directed toward relieving symptoms such as shortness of breath, decreased exercise tolerance, and extremity edema.    Expected Outcomes  Improve functional capacity of life;Short term: Attendance in program 2-3 days a week with increased exercise capacity. Reported lower sodium intake. Reported increased fruit and vegetable intake. Reports medication compliance.;Short term: Daily weights obtained and reported for increase. Utilizing diuretic protocols set by physician.;Long term: Adoption of self-care skills and reduction of barriers for early signs and symptoms recognition and intervention leading to self-care maintenance.    Hypertension  Yes    Intervention  Monitor prescription use compliance.;Provide education on lifestyle modifcations including regular physical activity/exercise, weight management, moderate sodium restriction and increased consumption of fresh fruit, vegetables, and low fat dairy, alcohol moderation, and smoking cessation.    Expected Outcomes  Short Term: Continued assessment and intervention until BP is < 140/973mHG in hypertensive participants. < 130/8067mG in hypertensive participants with diabetes, heart failure or chronic kidney disease.;Long Term: Maintenance of blood pressure at goal levels.    Lipids  Yes    Intervention  Provide education and support for participant on nutrition & aerobic/resistive exercise along with prescribed medications to achieve LDL <58m65mDL >40mg43m Expected Outcomes  Short Term: Participant states understanding of desired cholesterol values and is compliant with medications prescribed. Participant is following exercise prescription and nutrition guidelines.;Long Term: Cholesterol  controlled with medications as prescribed, with individualized exercise RX and with personalized nutrition plan. Value goals: LDL < 58mg,36m > 40 mg.       Core Components/Risk Factors/Patient Goals Review:  Goals and Risk Factor Review    Row Name 01/02/18 0846 01/25/18 0907           Core Components/Risk Factors/Patient Goals Review   Personal Goals Review  Heart Failure;Hypertension;Weight Management/Obesity;Lipids  Heart Failure;Hypertension;Weight Management/Obesity;Lipids      Review  Patient reports taking all medications and exercises on a regular basis. He has been losing weight and is now down to 323 lbs from 350 lbs. He reports all lipid panel numbers are in acceptable ranges and blood pressures have been good. He weighs himself each day to assess for heart failure symptoms and is educated on warning signs of heart failure.   Jack iBarnabas Listering to graduate next week.  He is going to continue to work on weight loss!  He is also monitoring his heart failure and denies symptoms other than SOB.  He will continue to monitor his blood pressures and heart failure.       Expected Outcomes  Short: Short term weight goal 315 lbs. Long: Long term weight goal 300 lbs.   Short: Graduate!  Long: Continue to manage heart failure.          Core Components/Risk Factors/Patient Goals at Discharge (Final Review):  Goals and Risk Factor Review - 01/25/18 0907      Core Components/Risk Factors/Patient Goals Review   Personal Goals Review  Heart Failure;Hypertension;Weight Management/Obesity;Lipids    Review  Jack iBarnabas Listering to graduate next week.  He is going to continue to work on weight loss!  He is also monitoring his heart failure and  denies symptoms other than SOB.  He will continue to monitor his blood pressures and heart failure.     Expected Outcomes  Short: Graduate!  Long: Continue to manage heart failure.        ITP Comments: ITP Comments    Row Name 12/12/17 1226 12/21/17 0554 12/21/17 0601  01/18/18 0545 02/03/18 0930   ITP Comments  Med Review completed. Initial ITP created. Diagnosis can be found in [facility] 6/27  30 day review completed. ITP sent to Dr. Emily Filbert, Medical Director of Cardiac Rehab. Continue with ITP unless changes are made by physician  30 day review completed. ITP sent to Dr. Emily Filbert, Medical Director of Cardiac Rehab. Continue with ITP unless changes are made by physician  30 day review. Continue with ITP unless direccted changes per Medical Director Chart Review.  Discharge ITP sent and signed by Dr. Sabra Heck.  Discharge Summary routed to PCP and cardiologist.      Comments: Discharge ITP

## 2018-02-03 NOTE — Progress Notes (Signed)
Daily Session Note  Patient Details  Name: Christopher [name] MRN: [id number] Date of Birth: 1945/12/01 Referring Provider:     Cardiac Rehab from 12/12/2017 in [facility] Cardiac and Pulmonary Rehab  Referring Provider  Christopher Amel MD      Encounter Date: 02/03/2018  Check In: Session Check In - 02/03/18 0924      Check-In   Supervising physician immediately available to respond to emergencies  See telemetry face sheet for immediately available ER MD    Staff Present  Christopher Sam, MA, RCEP, CCRP, Exercise Physiologist;Christopher [name], BA, ACSM CEP, Exercise Physiologist;Christopher Sherryll Burger, RN BSN    Medication changes reported      No    Fall or balance concerns reported     No    Warm-up and Cool-down  Performed as group-led Higher education careers adviser Performed  Yes    VAD Patient?  No    PAD/SET Patient?  No      Pain Assessment   Currently in Pain?  No/denies          Social History   Tobacco Use  Smoking Status Former Smoker  Smokeless Tobacco Never Used    Goals Met:  Independence with exercise equipment Exercise tolerated well No report of cardiac concerns or symptoms Strength training completed today  Goals Unmet:  Not Applicable  Comments:  Christopher [name] graduated today from  rehab with 36 sessions completed.  Details of the patient's exercise prescription and what He needs to do in order to continue the prescription and progress were discussed with patient.  Patient was given a copy of prescription and goals.  Patient verbalized understanding.  Christopher [name] plans to continue to exercise by walking as he can.    Dr. Emily [name] is Medical Director for [facility].

## 2018-02-03 NOTE — Progress Notes (Signed)
Discharge Progress Report  Patient Details  Name: Christopher [name] MRN: [id number] Date of Birth: 30-Aug-1945 Referring Provider:     Cardiac Rehab from 12/12/2017 in [facility]  Referring Provider  Lujean Amel MD       Number of Visits: 36  Reason for Discharge:  Patient reached a stable level of exercise. Patient independent in their exercise.  Smoking History:  Social History   Tobacco Use  Smoking Status Former Smoker  Smokeless Tobacco Never Used    Diagnosis:  Heart failure, chronic systolic ([place])  ADL UCSD:   Initial Exercise Prescription: Initial Exercise Prescription - 12/12/17 1200      Date of Initial Exercise RX and Referring Provider   Date  12/12/17    Referring Provider  Lujean Amel MD      Treadmill   MPH  1.2    Grade  0.5    Minutes  15    METs  2      NuStep   Level  1    SPM  80    Minutes  15    METs  2      REL-XR   Level  1    Speed  50    Minutes  15    METs  2      Prescription Details   Frequency (times per week)  3    Duration  Progress to 45 minutes of aerobic exercise without signs/symptoms of physical distress      Intensity   THRR 40-80% of Max Heartrate  119-140    Ratings of Perceived Exertion  11-13    Perceived Dyspnea  0-4      Progression   Progression  Continue to progress workloads to maintain intensity without signs/symptoms of physical distress.      Resistance Training   Training Prescription  Yes    Weight  3 lbs    Reps  10-15       Discharge Exercise Prescription (Final Exercise Prescription Changes): Exercise Prescription Changes - 01/25/18 0900      Response to Exercise   Blood Pressure (Admit)  128/68    Blood Pressure (Exercise)  138/84    Blood Pressure (Exit)  134/80    Heart Rate (Admit)  108 bpm    Heart Rate (Exercise)  125 bpm    Heart Rate (Exit)  103 bpm    Rating of Perceived Exertion (Exercise)  13    Symptoms  knee pain on treadmill     Duration  Progress to 45 minutes of aerobic exercise without signs/symptoms of physical distress   taking rest breaks on treadmill only   Intensity  THRR unchanged      Progression   Progression  Continue to progress workloads to maintain intensity without signs/symptoms of physical distress.    Average METs  2.08      Resistance Training   Training Prescription  Yes    Weight  3 lbs    Reps  10-15      Interval Training   Interval Training  No      Treadmill   MPH  1    Grade  0.5    Minutes  15    METs  1.83      NuStep   Level  4    Minutes  15    METs  2.9      REL-XR   Level  1    Minutes  15    METs  1.5      Home Exercise Plan   Plans to continue exercise at  Home (comment)   walking, join gym ([employer] or [employer])   Frequency  Add 2 additional days to program exercise sessions.    Initial Home Exercises Provided  12/23/17       Functional Capacity: 6 Minute Walk    Row Name 12/12/17 1235 01/25/18 0900       6 Minute Walk   Phase  Initial  Discharge    Distance  694 feet  430 feet    Distance % Change  -  -38 %    Distance Feet Change  -  -264 ft    Walk Time  4.37 minutes  2.87 minutes    # of Rest Breaks  3 rest for knee pain: 27 sec, 38 sec, 33 sec  4 15 sec, 33 sec, 1:22, 58 sec    MPH  1.8  1.7    METS  1.94  0.6    RPE  17  15    Perceived Dyspnea   1  3    VO2 Peak  6.81  2.1    Symptoms  Yes (comment)  Yes (comment)    Comments  L knee pain 8/10 (made gait a little wobbly), SOB  knee pain 10/10, SOB    Resting HR  99 bpm  70 bpm    Resting BP  132/64  126/64    Resting Oxygen Saturation   99 %  93 %    Exercise Oxygen Saturation  during 6 min walk  97 %  90 %    Max Ex. HR  156 bpm  84 bpm    Max Ex. BP  166/72  174/84    2 Minute Post BP  144/66  -       Psychological, QOL, Others - Outcomes: PHQ 2/9: Depression screen [facility] 2/9 01/30/2018 01/24/2018 12/12/2017 05/20/2017 12/16/2016  Decreased Interest 1 0 0 0 0  Down, Depressed,  Hopeless 0 0 0 0 0  PHQ - 2 Score 1 0 0 0 0  Altered sleeping 2 0 0 0 0  Tired, decreased energy 1 0 1 1 2   Change in appetite 1 0 0 0 0  Feeling bad or failure about yourself  0 0 0 0 0  Trouble concentrating 0 0 0 0 0  Moving slowly or fidgety/restless 0 0 0 0 0  Suicidal thoughts 0 0 0 0 0  PHQ-9 Score 5 0 1 1 2   Difficult doing work/chores Not difficult at all - Not difficult at all Not difficult at all Not difficult at all    Quality of Life: Quality of Life - 01/30/18 1616      Quality of Life   Select  Quality of Life      Quality of Life Scores   Health/Function Pre  18.67 %    Health/Function Post  20.23 %    Health/Function % Change  8.36 %    Socioeconomic Pre  25.21 %    Socioeconomic Post  22.63 %    Socioeconomic % Change   -10.23 %    Psych/Spiritual Pre  26.57 %    Psych/Spiritual Post  27.21 %    Psych/Spiritual % Change  2.41 %    Family Pre  28.5 %    Family Post  21.9 %    Family % Change  -23.16 %  GLOBAL Pre  22.92 %    GLOBAL Post  22.41 %    GLOBAL % Change  -2.23 %       Personal Goals: Goals established at orientation with interventions provided to work toward goal. Personal Goals and Risk Factors at Admission - 12/12/17 1230      Core Components/Risk Factors/Patient Goals on Admission    Weight Management  Yes;Obesity;Weight Loss    Intervention  Weight Management: Develop a combined nutrition and exercise program designed to reach desired caloric intake, while maintaining appropriate intake of nutrient and fiber, sodium and fats, and appropriate energy expenditure required for the weight goal.;Weight Management: Provide education and appropriate resources to help participant work on and attain dietary goals.;Weight Management/Obesity: Establish reasonable short term and long term weight goals.;Obesity: Provide education and appropriate resources to help participant work on and attain dietary goals.    Admit Weight  322 lb 11.2 oz (146.4 kg)     Goal Weight: Short Term  317 lb (143.8 kg)    Goal Weight: Long Term  275 lb (124.7 kg)    Expected Outcomes  Short Term: Continue to assess and modify interventions until short term weight is achieved;Long Term: Adherence to nutrition and physical activity/exercise program aimed toward attainment of established weight goal;Weight Loss: Understanding of general recommendations for a balanced deficit meal plan, which promotes 1-2 lb weight loss per week and includes a negative energy balance of [phone] kcal/d;Understanding recommendations for meals to include 15-35% energy as protein, 25-35% energy from fat, 35-60% energy from carbohydrates, less than 200mg  of dietary cholesterol, 20-35 gm of total fiber daily;Understanding of distribution of calorie intake throughout the day with the consumption of 4-5 meals/snacks    Heart Failure  Yes    Intervention  Provide a combined exercise and nutrition program that is supplemented with education, support and counseling about heart failure. Directed toward relieving symptoms such as shortness of breath, decreased exercise tolerance, and extremity edema.    Expected Outcomes  Improve functional capacity of life;Short term: Attendance in program 2-3 days a week with increased exercise capacity. Reported lower sodium intake. Reported increased fruit and vegetable intake. Reports medication compliance.;Short term: Daily weights obtained and reported for increase. Utilizing diuretic protocols set by physician.;Long term: Adoption of self-care skills and reduction of barriers for early signs and symptoms recognition and intervention leading to self-care maintenance.    Hypertension  Yes    Intervention  Monitor prescription use compliance.;Provide education on lifestyle modifcations including regular physical activity/exercise, weight management, moderate sodium restriction and increased consumption of fresh fruit, vegetables, and low fat dairy, alcohol moderation, and  smoking cessation.    Expected Outcomes  Short Term: Continued assessment and intervention until BP is < 140/50mm HG in hypertensive participants. < 130/86mm HG in hypertensive participants with diabetes, heart failure or chronic kidney disease.;Long Term: Maintenance of blood pressure at goal levels.    Lipids  Yes    Intervention  Provide education and support for participant on nutrition & aerobic/resistive exercise along with prescribed medications to achieve LDL 70mg , HDL >40mg .    Expected Outcomes  Short Term: Participant states understanding of desired cholesterol values and is compliant with medications prescribed. Participant is following exercise prescription and nutrition guidelines.;Long Term: Cholesterol controlled with medications as prescribed, with individualized exercise RX and with personalized nutrition plan. Value goals: LDL < 70mg , HDL > 40 mg.        Personal Goals Discharge: Goals and Risk Factor Review  [place] Name 01/02/18 0846 01/25/18 0907           Core Components/Risk Factors/Patient Goals Review   Personal Goals Review  Heart Failure;Hypertension;Weight Management/Obesity;Lipids  Heart Failure;Hypertension;Weight Management/Obesity;Lipids      Review  Patient reports taking all medications and exercises on a regular basis. He has been losing weight and is now down to 323 lbs from 350 lbs. He reports all lipid panel numbers are in acceptable ranges and blood pressures have been good. He weighs himself each day to assess for heart failure symptoms and is educated on warning signs of heart failure.   Christopher [name] is going to graduate next week.  He is going to continue to work on weight loss!  He is also monitoring his heart failure and denies symptoms other than SOB.  He will continue to monitor his blood pressures and heart failure.       Expected Outcomes  Short: Short term weight goal 315 lbs. Long: Long term weight goal 300 lbs.   Short: Graduate!  Long: Continue to manage  heart failure.          Exercise Goals and Review: Exercise Goals    Row Name 12/12/17 1240             Exercise Goals   Increase Physical Activity  Yes       Intervention  Provide advice, education, support and counseling about physical activity/exercise needs.;Develop an individualized exercise prescription for aerobic and resistive training based on initial evaluation findings, risk stratification, comorbidities and participant's personal goals.       Expected Outcomes  Short Term: Attend rehab on a regular basis to increase amount of physical activity.;Long Term: Add in home exercise to make exercise part of routine and to increase amount of physical activity.;Long Term: Exercising regularly at least 3-5 days a week.       Increase Strength and Stamina  Yes       Intervention  Provide advice, education, support and counseling about physical activity/exercise needs.;Develop an individualized exercise prescription for aerobic and resistive training based on initial evaluation findings, risk stratification, comorbidities and participant's personal goals.       Expected Outcomes  Short Term: Increase workloads from initial exercise prescription for resistance, speed, and METs.;Long Term: Improve cardiorespiratory fitness, muscular endurance and strength as measured by increased METs and functional capacity (6MWT);Short Term: Perform resistance training exercises routinely during rehab and add in resistance training at home       Able to understand and use rate of perceived exertion (RPE) scale  Yes       Intervention  Provide education and explanation on how to use RPE scale       Expected Outcomes  Short Term: Able to use RPE daily in rehab to express subjective intensity level;Long Term:  Able to use RPE to guide intensity level when exercising independently       Able to understand and use Dyspnea scale  Yes       Intervention  Provide education and explanation on how to use Dyspnea scale        Expected Outcomes  Short Term: Able to use Dyspnea scale daily in rehab to express subjective sense of shortness of breath during exertion;Long Term: Able to use Dyspnea scale to guide intensity level when exercising independently       Knowledge and understanding of Target Heart Rate Range (THRR)  Yes       Intervention  Provide education and explanation of  THRR including how the numbers were predicted and where they are located for reference       Expected Outcomes  Short Term: Able to state/look up THRR;Long Term: Able to use THRR to govern intensity when exercising independently;Short Term: Able to use daily as guideline for intensity in rehab       Able to check pulse independently  Yes       Intervention  Provide education and demonstration on how to check pulse in carotid and radial arteries.;Review the importance of being able to check your own pulse for safety during independent exercise       Expected Outcomes  Short Term: Able to explain why pulse checking is important during independent exercise;Long Term: Able to check pulse independently and accurately       Understanding of Exercise Prescription  Yes       Intervention  Provide education, explanation, and written materials on patient's individual exercise prescription       Expected Outcomes  Short Term: Able to explain program exercise prescription;Long Term: Able to explain home exercise prescription to exercise independently          Exercise Goals Re-Evaluation: Exercise Goals Re-Evaluation    Row Name 12/19/17 0742 12/23/17 0816 12/26/17 1542 01/02/18 0853 01/09/18 1241     Exercise Goal Re-Evaluation   Exercise Goals Review  Increase Physical Activity;Increase Strength and Stamina;Able to understand and use rate of perceived exertion (RPE) scale;Knowledge and understanding of Target Heart Rate Range (THRR);Understanding of Exercise Prescription  Increase Physical Activity;Increase Strength and Stamina;Able to understand  and use rate of perceived exertion (RPE) scale;Knowledge and understanding of Target Heart Rate Range (THRR);Able to check pulse independently;Understanding of Exercise Prescription  Increase Physical Activity;Increase Strength and Stamina;Understanding of Exercise Prescription  Increase Physical Activity;Increase Strength and Stamina;Understanding of Exercise Prescription;Able to understand and use rate of perceived exertion (RPE) scale;Knowledge and understanding of Target Heart Rate Range (THRR)  Increase Physical Activity;Increase Strength and Stamina;Understanding of Exercise Prescription   Comments  Reviewed RPE scale, THR and program prescription with pt today.  Pt voiced understanding and was given a copy of goals to take home.   Reviewed home exercise with pt today.  Pt plans to walk and join gym for exercise.  He is trying to decide between [facility] and [employer].  Reviewed THR, pulse, RPE, sign and symptoms, and when to call 911 or MD.  Also discussed weather considerations and indoor options.  Pt voiced understanding.  Christopher [name] is off to a good start in rehab.  He is still taking some rest breaks on the treadmill because of his back and knees, but overall improving.  We will start to increase his seated equipment and continue to monitor his progress.   Christopher [name] is still taking breaks on the TM but has increased his walking time to 6 min before a break is needed. His main limiting factor is his knee pain and he is planning on eventually getting a knee replacement once he gets cleared by his cardiologist.   Christopher [name] continues to do well in rehab.  He is working on getting better at the treadmill and is up to level 3 on the NuStep.  We will continue to monitor his progression.    Expected Outcomes  Short: Use RPE daily to regulate intensity. Long: Follow program prescription in THR.  Short: Start to add in walking at home.  Long: Join gym  Short: Increase workloads on seated equipment.  Long: Continue to increase  strength and stamina.   Short: continue to increase walking time intervals on TM before rest break. Aim for 7-8 min of walking before a rest break is needed. Long: Continue with healhty lifestyle choices to stablize heart condition so that he can get his knee replacement.   Short: Increase workload on XR.  Long: Continue to increase strength and stamina.    [place] Name 01/25/18 0904             Exercise Goal Re-Evaluation   Exercise Goals Review  Increase Physical Activity;Increase Strength and Stamina;Understanding of Exercise Prescription       Comments  Christopher [name] is nearing graduation.  He was not able to improve his walk test due to his knee pain. He is feeling stronger overall and has more stamina, but still struggles with his knee and SOB.  He is planning to continue to exercise at the [facility], leading up to his knee replacement.  He feels that he is now ready, strength wise, for his surgery.       Expected Outcomes  Short: Graduation Long: Continue to exericse to maintain strength and stamina.           Nutrition & Weight - Outcomes: Pre Biometrics - 12/12/17 1241      Pre Biometrics   Height  6' 2.5" (1.892 m)    Weight  (!) 322 lb 11.2 oz (146.4 kg)    Waist Circumference  51 inches    Hip Circumference  47 inches    Waist to Hip Ratio  1.09 %    BMI (Calculated)  40.89    Single Leg Stand  2.94 seconds      Post Biometrics - 01/25/18 8676       Post  Biometrics   Height  6' 2.5" (1.892 m)    Weight  (!) 328 lb (148.8 kg)    Waist Circumference  53 inches    Hip Circumference  48 inches    Waist to Hip Ratio  1.1 %    BMI (Calculated)  41.56    Single Leg Stand  1.42 seconds       Nutrition: Nutrition Therapy & Goals - 12/26/17 1054      Nutrition Therapy   Diet  DASH    Drug/Food Interactions  Statins/Certain Fruits    Protein (specify units)  9oz    Fiber  35 grams    Whole Grain Foods  3 servings   chooses whole grains occasionally   Saturated Fats  15 max. grams     Fruits and Vegetables  6 servings/day   8 Ideal; eats fruits and vegetables each day   Sodium  1500 grams      Personal Nutrition Goals   Nutrition Goal  Limit late night snacking, espically on work days when you have a late dinner.    Personal Goal #2  Continue to work on reducing intake of fried foods    Personal Goal #3  Increase the amount of sugar free/ calorie free beverages you drink by at least 1 glass per day    Comments  He works in food service 4 days/week and does not have control over the amount of sodium added to the lunches he heats while there. Tries to choose a meat + 2 vegetables for lunches at work. He does not add salt to food but his son heavily salts meals at home which he sometimes eats. On his off days he is able to cook more meals for  himself such as grilled chicken sandwiches. Eats fruit and peanut butter crackers often for snacks. Does not always eat breakfast but occasionally does enjoy eggs and biscuits. He is working to reduce sugar sweetened beverage intake IE sweet tea      Intervention Plan   Intervention  Nutrition handout(s) given to patient.;Prescribe, educate and counsel regarding individualized specific dietary modifications aiming towards targeted core components such as weight, hypertension, lipid management, diabetes, heart failure and other comorbidities.   general heart healthy eating handout provided   Expected Outcomes  Short Term Goal: Understand basic principles of dietary content, such as calories, fat, sodium, cholesterol and nutrients.;Short Term Goal: A plan has been developed with personal nutrition goals set during dietitian appointment.;Long Term Goal: Adherence to prescribed nutrition plan.       Nutrition Discharge: Nutrition Assessments - 01/30/18 1616      MEDFICTS Scores   Pre Score  40    Post Score  52    Score Difference  12       Education Questionnaire Score: Knowledge Questionnaire Score - 01/30/18 1616      Knowledge  Questionnaire Score   Pre Score  21/26    Post Score  26/26       Goals reviewed with patient; copy given to patient.

## 2018-02-12 ENCOUNTER — Other Ambulatory Visit: Payer: Self-pay | Admitting: Family Medicine

## 2018-02-12 DIAGNOSIS — I10 Essential (primary) hypertension: Secondary | ICD-10-CM

## 2018-02-20 ENCOUNTER — Other Ambulatory Visit: Payer: Self-pay

## 2018-02-20 NOTE — Progress Notes (Unsigned)
Told pt to call Dr Johnney Ou to see what to do concerning them taking him off his etodolac

## 2018-02-23 ENCOUNTER — Other Ambulatory Visit: Payer: Self-pay | Admitting: Family Medicine

## 2018-02-23 DIAGNOSIS — G459 Transient cerebral ischemic attack, unspecified: Secondary | ICD-10-CM

## 2018-02-24 DIAGNOSIS — M1990 Unspecified osteoarthritis, unspecified site: Secondary | ICD-10-CM | POA: Diagnosis not present

## 2018-02-24 DIAGNOSIS — M1A0790 Idiopathic chronic gout, unspecified ankle and foot, without tophus (tophi): Secondary | ICD-10-CM | POA: Diagnosis not present

## 2018-03-13 ENCOUNTER — Ambulatory Visit (INDEPENDENT_AMBULATORY_CARE_PROVIDER_SITE_OTHER): Payer: Medicare Other | Admitting: Family Medicine

## 2018-03-13 ENCOUNTER — Encounter: Payer: Self-pay | Admitting: Family Medicine

## 2018-03-13 VITALS — BP 120/82 | HR 80 | Ht 74.5 in | Wt 305.0 lb

## 2018-03-13 DIAGNOSIS — I25118 Atherosclerotic heart disease of native coronary artery with other forms of angina pectoris: Secondary | ICD-10-CM | POA: Diagnosis not present

## 2018-03-13 DIAGNOSIS — E860 Dehydration: Secondary | ICD-10-CM | POA: Diagnosis not present

## 2018-03-13 NOTE — Progress Notes (Signed)
Date:  03/13/2018   Name:  Christopher [name]   DOB:  Dec 16, 1945   MRN:  [id number]   Chief Complaint: Follow-up (? dehydration from having diarrhea for 9 days- has cleared up, but started after taking pred taper for gout attack. nephrologist stopped his Etodolac and Amlodipine and put him on pred taper and Allopurinol)  Hypertension  This is a chronic (follow up dehydration) problem. The current episode started more than 1 year ago. The problem has been gradually improving since onset. The problem is controlled. Pertinent negatives include no anxiety, blurred vision, chest pain, headaches, malaise/fatigue, neck pain, orthopnea, palpitations, peripheral edema or shortness of breath. There are no associated agents to hypertension. Risk factors for coronary artery disease include diabetes mellitus and dyslipidemia. Past treatments include ACE inhibitors, beta blockers, calcium channel blockers and diuretics. The current treatment provides moderate improvement. There are no compliance problems.  There is no history of angina, kidney disease, CAD/MI, CVA, heart failure, left ventricular hypertrophy, PVD or retinopathy. There is no history of chronic renal disease, a hypertension causing med or renovascular disease.    Review of Systems  Constitutional: Negative for chills, fever and malaise/fatigue.  HENT: Negative for drooling, ear discharge, ear pain, postnasal drip, rhinorrhea, sinus pressure, sinus pain, sneezing and sore throat.   Eyes: Negative for blurred vision.  Respiratory: Negative for cough, shortness of breath and wheezing.   Cardiovascular: Negative for chest pain, palpitations, orthopnea and leg swelling.  Gastrointestinal: Negative for abdominal pain, blood in stool, constipation, diarrhea and nausea.  Endocrine: Negative for polydipsia.  Genitourinary: Negative for dysuria, frequency, hematuria and urgency.  Musculoskeletal: Negative for back pain, myalgias and neck pain.    Skin: Negative for rash.  Allergic/Immunologic: Negative for environmental allergies.  Neurological: Negative for dizziness and headaches.  Hematological: Does not bruise/bleed easily.  Psychiatric/Behavioral: Negative for suicidal ideas. The patient is not nervous/anxious.     Patient Active Problem List   Diagnosis Date Noted  . TIA (transient ischemic attack) 12/12/2017  . 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 Encounter for general adult medical examination without abnormal findings 10/22/2014  . Disorder of kidney and ureter 10/22/2014  . Renal colic 05/39/7673  . 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 Creatinine elevation 10/22/2014  . Essential (primary) hypertension 10/22/2014  . H/O hypercholesterolemia 10/22/2014  . Dysmetabolic syndrome 41/93/7902  . H/O acute myocardial infarction 10/22/2014  . Adiposity 10/22/2014  . Impaired renal function 10/22/2014  . Need for vaccination 10/22/2014  . 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 S/P coronary artery stent placement 04/09/2014  . Chronic kidney disease, stage [name] (moderate) ([facility]) 01/25/2011    No Known Allergies  Past Surgical History:  Procedure Laterality Date  . COLONOSCOPY  2011  . KNEE ARTHROSCOPY Right 2016    Social History   Tobacco Use  . Smoking status: Former Research scientist (life sciences)  . Smokeless tobacco: Never Used  Substance Use Topics  . Alcohol use: No    Alcohol/week: 0.0 standard drinks  . Drug use: No     Medication list has been reviewed and updated.  Current Meds  Medication Sig  . allopurinol (ZYLOPRIM) 100 MG tablet Take by mouth.  . Cholecalciferol (VITAMIN D) 2000 units tablet Take by mouth.  . clopidogrel (PLAVIX) 75 MG tablet TAKE 1 TABLET BY MOUTH EVERY DAY AT 6 AM  . etodolac (LODINE XL) 500 MG 24 hr tablet Take 500 mg by mouth daily.  . fenofibrate 160 MG tablet  Take 1 tablet (160 mg total) by mouth daily.  . metoprolol succinate (TOPROL-XL) 100  MG 24 hr tablet Take with or immediately following a meal.  . niacin 500 MG CR capsule Take 1 capsule (500 mg total) by mouth daily at 6 (six) AM.  . ramipril (ALTACE) 10 MG capsule TAKE 1 CAPSULE(10 MG) BY MOUTH DAILY  . simvastatin (ZOCOR) 40 MG tablet One a day  . torsemide (DEMADEX) 10 MG tablet Take 1 tablet by mouth daily. Dr Clayborn Bigness    [facility] 2/9 Scores 01/30/2018 01/24/2018 12/12/2017 05/20/2017  PHQ - 2 Score 1 0 0 0  PHQ- 9 Score 5 0 1 1    Physical Exam Vitals signs and nursing note reviewed.  HENT:     Head: Normocephalic.     Right Ear: Tympanic membrane and external ear normal.     Left Ear: Tympanic membrane and external ear normal.     Nose: Nose normal.  Eyes:     General: No scleral icterus.       Right eye: No discharge.        Left eye: No discharge.     Conjunctiva/sclera: Conjunctivae normal.     Pupils: Pupils are equal, round, and reactive to light.  Neck:     Musculoskeletal: Normal range of motion and neck supple.     Thyroid: No thyromegaly.     Vascular: No JVD.     Trachea: No tracheal deviation.  Cardiovascular:     Rate and Rhythm: Normal rate and regular rhythm.     Pulses: Normal pulses.     Heart sounds: Normal heart sounds. No murmur. No friction rub. No gallop.   Pulmonary:     Effort: No respiratory distress.     Breath sounds: Normal breath sounds. No wheezing or rales.  Abdominal:     General: Bowel sounds are normal.     Palpations: Abdomen is soft. There is no mass.     Tenderness: There is no abdominal tenderness. There is no guarding or rebound.  Musculoskeletal: Normal range of motion.        General: No tenderness.  Lymphadenopathy:     Cervical: No cervical adenopathy.  Skin:    General: Skin is warm.     Findings: No rash.  Neurological:     Mental Status: He is alert and oriented to person, place, and time.     Cranial Nerves: No cranial nerve deficit.     Deep Tendon Reflexes: Reflexes are normal and symmetric.     BP  120/82   Pulse 80   Ht 6' 2.5" (1.892 m)   Wt (!) 305 lb (138.3 kg)   BMI 38.64 kg/m   Assessment and Plan: 1. Dehydration New onset patient had a recent bout diarrhea several days.  It was noted that patient lost 20 pounds for the past couple of weeks.  Exam is consistent with dehydration.  Patient does not demonstrate any orthostatic symptoms or signs.  Oral hydration with fluids discussed with patient suggested that patient hold his diuretic for at least 1 to 2 weeks.  If nausea vomiting diarrhea diarrhea were to resume patient is to call.  Check renal panel to test possibility of prerenal azotemia recheck as needed. - Renal Function Panel

## 2018-03-13 NOTE — Patient Instructions (Signed)
Dehydration, Adult  Dehydration is a condition in which there is not enough fluid or water in the body. This happens when you lose more fluids than you take in. Important organs, such as the kidneys, brain, and heart, cannot function without a proper amount of fluids. Any loss of fluids from the body can lead to dehydration. Dehydration can range from mild to severe. This condition should be treated right away to prevent it from becoming severe. What are the causes? This condition may be caused by:  Vomiting.  Diarrhea.  Excessive sweating, such as from heat exposure or exercise.  Not drinking enough fluid, especially: ? When ill. ? While doing activity that requires a lot of energy.  Excessive urination.  Fever.  Infection.  Certain medicines, such as medicines that cause the body to lose excess fluid (diuretics).  Inability to access safe drinking water.  Reduced physical ability to get adequate water and food. What increases the risk? This condition is more likely to develop in people:  Who have a poorly controlled long-term (chronic) illness, such as diabetes, heart disease, or kidney disease.  Who are age 65 or older.  Who are disabled.  Who live in a place with high altitude.  Who play endurance sports. What are the signs or symptoms? Symptoms of mild dehydration may include:  Thirst.  Dry lips.  Slightly dry mouth.  Dry, warm skin.  Dizziness. Symptoms of moderate dehydration may include:  Very dry mouth.  Muscle cramps.  Dark urine. Urine may be the color of tea.  Decreased urine production.  Decreased tear production.  Heartbeat that is irregular or faster than normal (palpitations).  Headache.  Light-headedness, especially when you stand up from a sitting position.  Fainting (syncope). Symptoms of severe dehydration may include:  Changes in skin, such as: ? Cold and clammy skin. ? Blotchy (mottled) or pale skin. ? Skin that does  not quickly return to normal after being lightly pinched and released (poor skin turgor).  Changes in body fluids, such as: ? Extreme thirst. ? No tear production. ? Inability to sweat when body temperature is high, such as in hot weather. ? Very little urine production.  Changes in vital signs, such as: ? Weak pulse. ? Pulse that is more than 100 beats a minute when sitting still. ? Rapid breathing. ? Low blood pressure.  Other changes, such as: ? Sunken eyes. ? Cold hands and feet. ? Confusion. ? Lack of energy (lethargy). ? Difficulty waking up from sleep. ? Short-term weight loss. ? Unconsciousness. How is this diagnosed? This condition is diagnosed based on your symptoms and a physical exam. Blood and urine tests may be done to help confirm the diagnosis. How is this treated? Treatment for this condition depends on the severity. Mild or moderate dehydration can often be treated at home. Treatment should be started right away. Do not wait until dehydration becomes severe. Severe dehydration is an emergency and it needs to be treated in a hospital. Treatment for mild dehydration may include:  Drinking more fluids.  Replacing salts and minerals in your blood (electrolytes) that you may have lost. Treatment for moderate dehydration may include:  Drinking an oral rehydration solution (ORS). This is a drink that helps you replace fluids and electrolytes (rehydrate). It can be found at pharmacies and retail stores. Treatment for severe dehydration may include:  Receiving fluids through an IV tube.  Receiving an electrolyte solution through a feeding tube that is passed through your nose and   into your stomach (nasogastric tube, or NG tube).  Correcting any abnormalities in electrolytes.  Treating the underlying cause of dehydration. Follow these instructions at home:  If directed by your health care provider, drink an ORS: ? Make an ORS by following instructions on the  package. ? Start by drinking small amounts, about  cup (120 mL) every 5-10 minutes. ? Slowly increase how much you drink until you have taken the amount recommended by your health care provider.  Drink enough clear fluid to keep your urine clear or pale yellow. If you were told to drink an ORS, finish the ORS first, then start slowly drinking other clear fluids. Drink fluids such as: ? Water. Do not drink only water. Doing that can lead to having too little salt (sodium) in the body (hyponatremia). ? Ice chips. ? Fruit juice that you have added water to (diluted fruit juice). ? Low-calorie sports drinks.  Avoid: ? Alcohol. ? Drinks that contain a lot of sugar. These include high-calorie sports drinks, fruit juice that is not diluted, and soda. ? Caffeine. ? Foods that are greasy or contain a lot of fat or sugar.  Take over-the-counter and prescription medicines only as told by your health care provider.  Do not take sodium tablets. This can lead to having too much sodium in the body (hypernatremia).  Eat foods that contain a healthy balance of electrolytes, such as bananas, oranges, potatoes, tomatoes, and spinach.  Keep all follow-up visits as told by your health care provider. This is important. Contact a health care provider if:  You have abdominal pain that: ? Gets worse. ? Stays in one area (localizes).  You have a rash.  You have a stiff neck.  You are more irritable than usual.  You are sleepier or more difficult to wake up than usual.  You feel weak or dizzy.  You feel very thirsty.  You have urinated only a small amount of very dark urine over 6-8 hours. Get help right away if:  You have symptoms of severe dehydration.  You cannot drink fluids without vomiting.  Your symptoms get worse with treatment.  You have a fever.  You have a severe headache.  You have vomiting or diarrhea that: ? Gets worse. ? Does not go away.  You have blood or green matter  (bile) in your vomit.  You have blood in your stool. This may cause stool to look black and tarry.  You have not urinated in 6-8 hours.  You faint.  Your heart rate while sitting still is over 100 beats a minute.  You have trouble breathing. This information is not intended to replace advice given to you by your health care provider. Make sure you discuss any questions you have with your health care provider. Document Released: 03/01/2005 Document Revised: 09/26/2015 Document Reviewed: 04/25/2015 Elsevier Interactive Patient Education  2019 [employer]  

## 2018-03-14 LAB — RENAL FUNCTION PANEL
Albumin: 3.6 g/dL (ref 3.5–4.8)
BUN/Creatinine Ratio: 17 (ref 10–24)
BUN: 39 mg/dL — ABNORMAL HIGH (ref 8–27)
CALCIUM: 9.4 mg/dL (ref 8.6–10.2)
CO2: 15 mmol/L — AB (ref 20–29)
CREATININE: 2.29 mg/dL — AB (ref 0.76–1.27)
Chloride: 115 mmol/L — ABNORMAL HIGH (ref 96–106)
GFR calc Af Amer: 32 mL/min/{1.73_m2} — ABNORMAL LOW (ref >59)
GFR calc non Af Amer: 28 mL/min/{1.73_m2} — ABNORMAL LOW (ref >59)
Glucose: 79 mg/dL (ref 65–99)
POTASSIUM: 4.5 mmol/L (ref 3.5–5.2)
Phosphorus: 2.5 mg/dL (ref 2.5–4.5)
Sodium: 144 mmol/L (ref 134–144)

## 2018-03-28 ENCOUNTER — Other Ambulatory Visit: Payer: Self-pay | Admitting: Family Medicine

## 2018-03-28 DIAGNOSIS — I10 Essential (primary) hypertension: Secondary | ICD-10-CM

## 2018-04-14 ENCOUNTER — Other Ambulatory Visit: Payer: Self-pay | Admitting: Family Medicine

## 2018-04-14 DIAGNOSIS — I10 Essential (primary) hypertension: Secondary | ICD-10-CM

## 2018-04-24 DIAGNOSIS — I10 Essential (primary) hypertension: Secondary | ICD-10-CM | POA: Diagnosis not present

## 2018-04-24 DIAGNOSIS — M1A0790 Idiopathic chronic gout, unspecified ankle and foot, without tophus (tophi): Secondary | ICD-10-CM | POA: Diagnosis not present

## 2018-04-24 DIAGNOSIS — I129 Hypertensive chronic kidney disease with stage 1 through stage 4 chronic kidney disease, or unspecified chronic kidney disease: Secondary | ICD-10-CM | POA: Diagnosis not present

## 2018-04-24 DIAGNOSIS — N184 Chronic kidney disease, stage 4 (severe): Secondary | ICD-10-CM | POA: Diagnosis not present

## 2018-04-24 DIAGNOSIS — M170 Bilateral primary osteoarthritis of knee: Secondary | ICD-10-CM | POA: Diagnosis not present

## 2018-04-24 DIAGNOSIS — N183 Chronic kidney disease, stage 3 (moderate): Secondary | ICD-10-CM | POA: Diagnosis not present

## 2018-05-01 DIAGNOSIS — I25118 Atherosclerotic heart disease of native coronary artery with other forms of angina pectoris: Secondary | ICD-10-CM | POA: Diagnosis not present

## 2018-05-01 DIAGNOSIS — R600 Localized edema: Secondary | ICD-10-CM | POA: Diagnosis not present

## 2018-05-01 DIAGNOSIS — I509 Heart failure, unspecified: Secondary | ICD-10-CM | POA: Diagnosis not present

## 2018-05-01 DIAGNOSIS — I5022 Chronic systolic (congestive) heart failure: Secondary | ICD-10-CM | POA: Diagnosis not present

## 2018-05-01 DIAGNOSIS — R0602 Shortness of breath: Secondary | ICD-10-CM | POA: Diagnosis not present

## 2018-05-01 DIAGNOSIS — I739 Peripheral vascular disease, unspecified: Secondary | ICD-10-CM | POA: Diagnosis not present

## 2018-05-01 DIAGNOSIS — Z6837 Body mass index (BMI) 37.0-37.9, adult: Secondary | ICD-10-CM | POA: Diagnosis not present

## 2018-05-01 DIAGNOSIS — M1711 Unilateral primary osteoarthritis, right knee: Secondary | ICD-10-CM | POA: Diagnosis not present

## 2018-05-01 DIAGNOSIS — I639 Cerebral infarction, unspecified: Secondary | ICD-10-CM | POA: Diagnosis not present

## 2018-05-01 DIAGNOSIS — G459 Transient cerebral ischemic attack, unspecified: Secondary | ICD-10-CM | POA: Diagnosis not present

## 2018-05-01 DIAGNOSIS — E6609 Other obesity due to excess calories: Secondary | ICD-10-CM | POA: Diagnosis not present

## 2018-05-26 ENCOUNTER — Other Ambulatory Visit: Payer: Self-pay | Admitting: Family Medicine

## 2018-05-26 DIAGNOSIS — G459 Transient cerebral ischemic attack, unspecified: Secondary | ICD-10-CM

## 2018-08-04 ENCOUNTER — Other Ambulatory Visit: Payer: Self-pay | Admitting: Family Medicine

## 2018-08-04 DIAGNOSIS — I10 Essential (primary) hypertension: Secondary | ICD-10-CM

## 2018-09-04 ENCOUNTER — Ambulatory Visit: Payer: Medicare Other | Admitting: Family Medicine

## 2018-09-08 ENCOUNTER — Other Ambulatory Visit: Payer: Self-pay

## 2018-09-08 ENCOUNTER — Ambulatory Visit (INDEPENDENT_AMBULATORY_CARE_PROVIDER_SITE_OTHER): Payer: Medicare Other | Admitting: Family Medicine

## 2018-09-08 ENCOUNTER — Encounter: Payer: Self-pay | Admitting: Family Medicine

## 2018-09-08 VITALS — BP 104/58 | HR 84 | Ht 74.5 in | Wt 289.0 lb

## 2018-09-08 DIAGNOSIS — E7849 Other hyperlipidemia: Secondary | ICD-10-CM | POA: Diagnosis not present

## 2018-09-08 DIAGNOSIS — I679 Cerebrovascular disease, unspecified: Secondary | ICD-10-CM | POA: Diagnosis not present

## 2018-09-08 DIAGNOSIS — G459 Transient cerebral ischemic attack, unspecified: Secondary | ICD-10-CM

## 2018-09-08 DIAGNOSIS — I10 Essential (primary) hypertension: Secondary | ICD-10-CM | POA: Diagnosis not present

## 2018-09-08 MED ORDER — RAMIPRIL 10 MG PO CAPS: ORAL_CAPSULE | ORAL | 1 refills | Status: DC

## 2018-09-08 MED ORDER — SIMVASTATIN 40 MG PO TABS: ORAL_TABLET | ORAL | 1 refills | Status: DC

## 2018-09-08 MED ORDER — TORSEMIDE 20 MG PO TABS: 20.0000 mg | ORAL_TABLET | Freq: Every day | ORAL | 3 refills | Status: DC

## 2018-09-08 MED ORDER — FENOFIBRATE 160 MG PO TABS: 160.0000 mg | ORAL_TABLET | Freq: Every day | ORAL | 1 refills | Status: DC

## 2018-09-08 MED ORDER — AMLODIPINE BESYLATE 5 MG PO TABS: ORAL_TABLET | ORAL | 1 refills | Status: DC

## 2018-09-08 MED ORDER — METOPROLOL SUCCINATE ER 100 MG PO TB24: ORAL_TABLET | ORAL | 1 refills | Status: DC

## 2018-09-08 MED ORDER — CLOPIDOGREL BISULFATE 75 MG PO TABS: ORAL_TABLET | ORAL | 1 refills | Status: DC

## 2018-09-08 NOTE — Progress Notes (Signed)
Date:  09/08/2018   Name:  Christopher [name]   DOB:  [birth year]   MRN:  [id number]   Chief Complaint: Hypertension, Hyperlipidemia, and myocardial infarction (takes plavix for this)  Hypertension This is a chronic problem. The current episode started more than 1 year ago. The problem is unchanged. The problem is controlled. Pertinent negatives include no anxiety, blurred vision, chest pain, headaches, malaise/fatigue, neck pain, orthopnea, palpitations, peripheral edema, PND, shortness of breath or sweats. There are no associated agents to hypertension. There are no known risk factors for coronary artery disease. Past treatments include beta blockers, calcium channel blockers, ACE inhibitors and diuretics. The current treatment provides moderate improvement. There are no compliance problems.  There is no history of angina, kidney disease, CAD/MI, CVA, heart failure, left ventricular hypertrophy, PVD or retinopathy. There is no history of chronic renal disease, a hypertension causing med or renovascular disease.  Hyperlipidemia This is a chronic problem. The current episode started more than 1 year ago. The problem is controlled. Recent lipid tests were reviewed and are normal. Exacerbating diseases include obesity. He has no history of chronic renal disease, diabetes, hypothyroidism, liver disease or nephrotic syndrome. Pertinent negatives include no chest pain, focal sensory loss, focal weakness, myalgias or shortness of breath. The current treatment provides mild improvement of lipids. There are no compliance problems.  Risk factors for coronary artery disease include dyslipidemia.  Heart Problem This is a chronic problem. The current episode started more than 1 year ago. The problem occurs intermittently. The problem has been gradually improving. Pertinent negatives include no abdominal pain, chest pain, chills, congestion, coughing, fever, headaches, myalgias, nausea, neck pain, rash, sore  throat, visual change or weakness. The treatment provided moderate relief.  Neurologic Problem The patient's pertinent negatives include no altered mental status, clumsiness, focal sensory loss, focal weakness, loss of balance, memory loss, near-syncope, slurred speech, syncope, visual change or weakness. Primary symptoms comment: for cerebral vascular disease. This is a new problem. The current episode started more than 1 year ago. The problem has been gradually improving since onset. There was no focality noted. Pertinent negatives include no abdominal pain, back pain, chest pain, dizziness, fever, headaches, nausea, neck pain, palpitations or shortness of breath. The treatment provided moderate relief. There is no history of liver disease.    Review of Systems  Constitutional: Negative for chills, fever and malaise/fatigue.  HENT: Negative for congestion, drooling, ear discharge, ear pain and sore throat.   Eyes: Negative for blurred vision.  Respiratory: Negative for cough, shortness of breath and wheezing.   Cardiovascular: Negative for chest pain, palpitations, orthopnea, leg swelling, PND and near-syncope.  Gastrointestinal: Negative for abdominal pain, blood in stool, constipation, diarrhea and nausea.  Endocrine: Negative for polydipsia.  Genitourinary: Negative for dysuria, frequency, hematuria and urgency.  Musculoskeletal: Negative for back pain, myalgias and neck pain.  Skin: Negative for rash.  Allergic/Immunologic: Negative for environmental allergies.  Neurological: Negative for dizziness, focal weakness, syncope, weakness, headaches and loss of balance.  Hematological: Does not bruise/bleed easily.  Psychiatric/Behavioral: Negative for memory loss and suicidal ideas. The patient is not nervous/anxious.     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    Encounter for general adult medical examination without abnormal findings 10/22/2014   Disorder of kidney and ureter 24/23/5361   Renal colic 44/31/5400   Coronary artery abnormality 10/22/2014   Creatinine elevation  10/22/2014   Essential (primary) hypertension 10/22/2014   H/O hypercholesterolemia 39/76/7341   Dysmetabolic syndrome 93/79/0240   H/O acute myocardial infarction 10/22/2014   Adiposity 10/22/2014   Impaired renal function 10/22/2014   Need for vaccination 10/22/2014   Status post total right knee replacement 06/20/2014   S/P coronary artery stent placement 04/09/2014   Chronic kidney disease, stage [name] (moderate) ([place]) 01/25/2011    No Known Allergies  Past Surgical History:  Procedure Laterality Date   COLONOSCOPY  2011   KNEE ARTHROSCOPY Right 2016    Social History   Tobacco Use   Smoking status: Former Smoker   Smokeless tobacco: Never Used  Substance Use Topics   Alcohol use: No    Alcohol/week: 0.0 standard drinks   Drug use: No     Medication list has been reviewed and updated.  Current Meds  Medication Sig   allopurinol (ZYLOPRIM) 100 MG tablet Take by mouth.   amLODipine (NORVASC) 5 MG tablet TAKE 1 TABLET(5 MG) BY MOUTH DAILY   Cholecalciferol (VITAMIN D) 2000 units tablet Take by mouth.   clopidogrel (PLAVIX) 75 MG tablet TAKE 1 TABLET BY MOUTH EVERY DAY AT 6 AM   diclofenac sodium (VOLTAREN) 1 % GEL Apply 4 g topically 4 (four) times daily.   etodolac (LODINE XL) 500 MG 24 hr tablet Take 500 mg by mouth daily.   fenofibrate 160 MG tablet Take 1 tablet (160 mg total) by mouth daily.   metoprolol succinate (TOPROL-XL) 100 MG 24 hr tablet TAKE 1 TABLET BY MOUTH EVERY DAY IMMEDIATELY FOLLOWING A MEAL   ramipril (ALTACE) 10 MG capsule TAKE 1 CAPSULE(10 MG) BY MOUTH DAILY   simvastatin (ZOCOR) 40 MG tablet One a day   sodium bicarbonate 650 MG tablet Take 1 tablet by mouth 2 (two) times daily. Kidney specialist    torsemide (DEMADEX) 20 MG tablet Take 1 tablet by mouth daily. Dr Clayborn Bigness    [facility] 2/9 Scores 09/08/2018 01/30/2018 01/24/2018 12/12/2017  PHQ - 2 Score 0 1 0 0  PHQ- 9 Score 0 5 0 1    BP Readings from Last 3 Encounters:  09/08/18 (!) 104/58  03/13/18 120/82  01/24/18 120/70    Physical Exam Vitals signs and nursing note reviewed.  Constitutional:      Appearance: He is obese.  HENT:     Head: Normocephalic.     Right Ear: Tympanic membrane, ear canal and external ear normal.     Left Ear: Tympanic membrane, ear canal and external ear normal.     Nose: Nose normal. No congestion or rhinorrhea.  Eyes:     General: No scleral icterus.       Right eye: No discharge.        Left eye: No discharge.     Conjunctiva/sclera: Conjunctivae normal.     Pupils: Pupils are equal, round, and reactive to light.  Neck:     Musculoskeletal: Normal range of motion and neck supple.     Thyroid: No thyromegaly.     Vascular: No JVD.     Trachea: No tracheal deviation.  Cardiovascular:     Rate and Rhythm: Normal rate and regular rhythm.     Heart sounds: Normal heart sounds. No murmur. No friction rub. No gallop.   Pulmonary:     Effort: No respiratory distress.     Breath sounds: Normal breath sounds. No stridor. No wheezing, rhonchi or rales.  Chest:     Chest wall: No tenderness.  Abdominal:  General: Bowel sounds are normal.     Palpations: Abdomen is soft. There is no mass.     Tenderness: There is no abdominal tenderness. There is no guarding or rebound.  Musculoskeletal: Normal range of motion.        General: No swelling or tenderness.  Lymphadenopathy:     Cervical: No cervical adenopathy.  Skin:    General: Skin is warm.     Coloration: Skin is not jaundiced or pale.     Findings: No bruising, erythema, lesion or rash.  Neurological:     Mental Status: He is alert and oriented to person, place, and time.     Cranial Nerves: No cranial nerve deficit.     Deep Tendon  Reflexes: Reflexes are normal and symmetric.     Wt Readings from Last 3 Encounters:  09/08/18 289 lb (131.1 kg)  03/13/18 (!) 305 lb (138.3 kg)  01/25/18 (!) 328 lb (148.8 kg)    BP (!) 104/58    Pulse 84    Ht 6' 2.5" (1.892 m)    Wt 289 lb (131.1 kg)    BMI 36.61 kg/m   Assessment and Plan: 1. Essential (primary) hypertension Chronic.  Controlled.  Continue ramipril 10 mg once a day metoprolol XL 100 mg once a day amlodipine 5 mg once a day and Demadex 20 mg once a day.  Will check a renal function panel today.  And will recheck patient in 6 months. - Renal Function Panel - ramipril (ALTACE) 10 MG capsule; One a day  Dispense: 90 capsule; Refill: 1 - metoprolol succinate (TOPROL-XL) 100 MG 24 hr tablet; TAKE 1 TABLET BY MOUTH EVERY DAY IMMEDIATELY FOLLOWING A MEAL  Dispense: 90 tablet; Refill: 1 - amLODipine (NORVASC) 5 MG tablet; One a day  Dispense: 90 tablet; Refill: 1 - torsemide (DEMADEX) 20 MG tablet; Take 1 tablet (20 mg total) by mouth daily. Dr Clayborn Bigness  Dispense: 90 tablet; Refill: 3  2. Familial multiple lipoprotein-type hyperlipidemia Chronic.  Controlled.  Continue fenofibrate 160 mg once a day and simvastatin 40 mg once a day. - fenofibrate 160 MG tablet; Take 1 tablet (160 mg total) by mouth daily.  Dispense: 90 tablet; Refill: 1 - simvastatin (ZOCOR) 40 MG tablet; One a day  Dispense: 90 tablet; Refill: 1  3. TIA (transient ischemic attack) Patient's had a stent to the carotid.  Will continue Plavix 75 mg once a day. - clopidogrel (PLAVIX) 75 MG tablet; One a day  Dispense: 90 tablet; Refill: 1  4. Cerebral vascular disease Patient has a history also of stroke.  Patient is under control with Plavix 75 mg once a day

## 2018-09-09 LAB — RENAL FUNCTION PANEL
Albumin: 4 g/dL (ref 3.7–4.7)
BUN/Creatinine Ratio: 20 (ref 10–24)
BUN: 61 mg/dL — ABNORMAL HIGH (ref 8–27)
CO2: 17 mmol/L — ABNORMAL LOW (ref 20–29)
Calcium: 9.5 mg/dL (ref 8.6–10.2)
Chloride: 104 mmol/L (ref 96–106)
Creatinine, Ser: 3.12 mg/dL — ABNORMAL HIGH (ref 0.76–1.27)
GFR calc Af Amer: 22 mL/min/{1.73_m2} — ABNORMAL LOW (ref >59)
GFR calc non Af Amer: 19 mL/min/{1.73_m2} — ABNORMAL LOW (ref >59)
Glucose: 87 mg/dL (ref 65–99)
Phosphorus: 4 mg/dL (ref 2.8–4.1)
Potassium: 4.4 mmol/L (ref 3.5–5.2)
Sodium: 141 mmol/L (ref 134–144)

## 2018-10-02 DIAGNOSIS — L578 Other skin changes due to chronic exposure to nonionizing radiation: Secondary | ICD-10-CM | POA: Diagnosis not present

## 2018-10-02 DIAGNOSIS — L570 Actinic keratosis: Secondary | ICD-10-CM | POA: Diagnosis not present

## 2018-10-13 ENCOUNTER — Ambulatory Visit
Admission: RE | Admit: 2018-10-13 | Discharge: 2018-10-13 | Disposition: A | Discharge status: Home / Self Care | Payer: Medicare Other | Attending: Family Medicine | Admitting: Family Medicine

## 2018-10-13 ENCOUNTER — Encounter: Payer: Self-pay | Admitting: Family Medicine

## 2018-10-13 ENCOUNTER — Other Ambulatory Visit: Payer: Self-pay

## 2018-10-13 ENCOUNTER — Ambulatory Visit
Admission: RE | Admit: 2018-10-13 | Discharge: 2018-10-13 | Disposition: A | Discharge status: Home / Self Care | Payer: Medicare Other | Source: Ambulatory Visit | Attending: Family Medicine | Admitting: Family Medicine

## 2018-10-13 ENCOUNTER — Ambulatory Visit (INDEPENDENT_AMBULATORY_CARE_PROVIDER_SITE_OTHER): Payer: Medicare Other | Admitting: Family Medicine

## 2018-10-13 ENCOUNTER — Other Ambulatory Visit
Admission: RE | Admit: 2018-10-13 | Discharge: 2018-10-13 | Disposition: A | Discharge status: Home / Self Care | Payer: Medicare Other | Source: Home / Self Care | Attending: Family Medicine | Admitting: Family Medicine

## 2018-10-13 VITALS — BP 104/66 | HR 107 | Ht 74.5 in | Wt 308.0 lb

## 2018-10-13 DIAGNOSIS — R0602 Shortness of breath: Secondary | ICD-10-CM | POA: Diagnosis not present

## 2018-10-13 DIAGNOSIS — I5020 Unspecified systolic (congestive) heart failure: Secondary | ICD-10-CM | POA: Diagnosis not present

## 2018-10-13 DIAGNOSIS — Z862 Personal history of diseases of the blood and blood-forming organs and certain disorders involving the immune mechanism: Secondary | ICD-10-CM | POA: Diagnosis not present

## 2018-10-13 DIAGNOSIS — Z8673 Personal history of transient ischemic attack (TIA), and cerebral infarction without residual deficits: Secondary | ICD-10-CM | POA: Diagnosis not present

## 2018-10-13 DIAGNOSIS — Z87891 Personal history of nicotine dependence: Secondary | ICD-10-CM | POA: Diagnosis not present

## 2018-10-13 DIAGNOSIS — I2510 Atherosclerotic heart disease of native coronary artery without angina pectoris: Secondary | ICD-10-CM | POA: Diagnosis present

## 2018-10-13 DIAGNOSIS — R9389 Abnormal findings on diagnostic imaging of other specified body structures: Secondary | ICD-10-CM | POA: Diagnosis not present

## 2018-10-13 DIAGNOSIS — I5023 Acute on chronic systolic (congestive) heart failure: Secondary | ICD-10-CM | POA: Diagnosis present

## 2018-10-13 DIAGNOSIS — E8770 Fluid overload, unspecified: Secondary | ICD-10-CM | POA: Diagnosis not present

## 2018-10-13 DIAGNOSIS — Z79899 Other long term (current) drug therapy: Secondary | ICD-10-CM | POA: Diagnosis not present

## 2018-10-13 DIAGNOSIS — I252 Old myocardial infarction: Secondary | ICD-10-CM | POA: Diagnosis not present

## 2018-10-13 DIAGNOSIS — I340 Nonrheumatic mitral (valve) insufficiency: Secondary | ICD-10-CM | POA: Diagnosis not present

## 2018-10-13 DIAGNOSIS — N189 Chronic kidney disease, unspecified: Secondary | ICD-10-CM | POA: Diagnosis not present

## 2018-10-13 DIAGNOSIS — E871 Hypo-osmolality and hyponatremia: Secondary | ICD-10-CM | POA: Diagnosis not present

## 2018-10-13 DIAGNOSIS — I77819 Aortic ectasia, unspecified site: Secondary | ICD-10-CM | POA: Diagnosis not present

## 2018-10-13 DIAGNOSIS — M109 Gout, unspecified: Secondary | ICD-10-CM | POA: Diagnosis present

## 2018-10-13 DIAGNOSIS — Z7902 Long term (current) use of antithrombotics/antiplatelets: Secondary | ICD-10-CM | POA: Diagnosis not present

## 2018-10-13 DIAGNOSIS — I517 Cardiomegaly: Secondary | ICD-10-CM | POA: Diagnosis not present

## 2018-10-13 DIAGNOSIS — J811 Chronic pulmonary edema: Secondary | ICD-10-CM | POA: Diagnosis not present

## 2018-10-13 DIAGNOSIS — I129 Hypertensive chronic kidney disease with stage 1 through stage 4 chronic kidney disease, or unspecified chronic kidney disease: Secondary | ICD-10-CM | POA: Diagnosis not present

## 2018-10-13 DIAGNOSIS — I4892 Unspecified atrial flutter: Secondary | ICD-10-CM | POA: Diagnosis not present

## 2018-10-13 DIAGNOSIS — N179 Acute kidney failure, unspecified: Secondary | ICD-10-CM | POA: Diagnosis not present

## 2018-10-13 DIAGNOSIS — Z01810 Encounter for preprocedural cardiovascular examination: Secondary | ICD-10-CM | POA: Diagnosis not present

## 2018-10-13 DIAGNOSIS — E870 Hyperosmolality and hypernatremia: Secondary | ICD-10-CM | POA: Diagnosis present

## 2018-10-13 DIAGNOSIS — Z20828 Contact with and (suspected) exposure to other viral communicable diseases: Secondary | ICD-10-CM | POA: Diagnosis not present

## 2018-10-13 DIAGNOSIS — E872 Acidosis: Secondary | ICD-10-CM | POA: Diagnosis not present

## 2018-10-13 DIAGNOSIS — N183 Chronic kidney disease, stage 3 (moderate): Secondary | ICD-10-CM | POA: Diagnosis not present

## 2018-10-13 DIAGNOSIS — Z955 Presence of coronary angioplasty implant and graft: Secondary | ICD-10-CM | POA: Diagnosis not present

## 2018-10-13 DIAGNOSIS — I5022 Chronic systolic (congestive) heart failure: Secondary | ICD-10-CM | POA: Diagnosis not present

## 2018-10-13 DIAGNOSIS — I499 Cardiac arrhythmia, unspecified: Secondary | ICD-10-CM | POA: Diagnosis not present

## 2018-10-13 DIAGNOSIS — I493 Ventricular premature depolarization: Secondary | ICD-10-CM | POA: Diagnosis not present

## 2018-10-13 DIAGNOSIS — N289 Disorder of kidney and ureter, unspecified: Secondary | ICD-10-CM | POA: Diagnosis not present

## 2018-10-13 DIAGNOSIS — I130 Hypertensive heart and chronic kidney disease with heart failure and stage 1 through stage 4 chronic kidney disease, or unspecified chronic kidney disease: Secondary | ICD-10-CM | POA: Diagnosis not present

## 2018-10-13 DIAGNOSIS — G4733 Obstructive sleep apnea (adult) (pediatric): Secondary | ICD-10-CM | POA: Diagnosis present

## 2018-10-13 DIAGNOSIS — E785 Hyperlipidemia, unspecified: Secondary | ICD-10-CM | POA: Diagnosis not present

## 2018-10-13 DIAGNOSIS — I472 Ventricular tachycardia: Secondary | ICD-10-CM | POA: Diagnosis not present

## 2018-10-13 LAB — RENAL FUNCTION PANEL
Albumin: 3.2 g/dL — ABNORMAL LOW (ref 3.5–5.0)
Anion gap: 14 (ref 5–15)
BUN: 124 mg/dL — ABNORMAL HIGH (ref 8–23)
CO2: 14 mmol/L — ABNORMAL LOW (ref 22–32)
Calcium: 8.8 mg/dL — ABNORMAL LOW (ref 8.9–10.3)
Chloride: 112 mmol/L — ABNORMAL HIGH (ref 98–111)
Creatinine, Ser: 5.77 mg/dL — ABNORMAL HIGH (ref 0.61–1.24)
GFR calc Af Amer: 10 mL/min — ABNORMAL LOW (ref >60)
GFR calc non Af Amer: 9 mL/min — ABNORMAL LOW (ref >60)
Glucose, Bld: 105 mg/dL — ABNORMAL HIGH (ref 70–99)
Phosphorus: 8.6 mg/dL — ABNORMAL HIGH (ref 2.5–4.6)
Potassium: 4.3 mmol/L (ref 3.5–5.1)
Sodium: 140 mmol/L (ref 135–145)

## 2018-10-13 LAB — CBC WITH DIFFERENTIAL/PLATELET
Abs Immature Granulocytes: 0.04 10*3/uL (ref 0.00–0.07)
Basophils Absolute: 0.1 10*3/uL (ref 0.0–0.1)
Basophils Relative: 1 %
Eosinophils Absolute: 0.2 10*3/uL (ref 0.0–0.5)
Eosinophils Relative: 2 %
HCT: 34.7 % — ABNORMAL LOW (ref 39.0–52.0)
Hemoglobin: 10.8 g/dL — ABNORMAL LOW (ref 13.0–17.0)
Immature Granulocytes: 1 %
Lymphocytes Relative: 17 %
Lymphs Abs: 1.4 10*3/uL (ref 0.7–4.0)
MCH: 29.4 pg (ref 26.0–34.0)
MCHC: 31.1 g/dL (ref 30.0–36.0)
MCV: 94.6 fL (ref 80.0–100.0)
Monocytes Absolute: 0.8 10*3/uL (ref 0.1–1.0)
Monocytes Relative: 10 %
Neutro Abs: 5.4 10*3/uL (ref 1.7–7.7)
Neutrophils Relative %: 69 %
Platelets: 167 10*3/uL (ref 150–400)
RBC: 3.67 MIL/uL — ABNORMAL LOW (ref 4.22–5.81)
RDW: 20 % — ABNORMAL HIGH (ref 11.5–15.5)
WBC: 7.8 10*3/uL (ref 4.0–10.5)
nRBC: 0 % (ref 0.0–0.2)

## 2018-10-13 NOTE — Progress Notes (Signed)
Date:  10/13/2018   Name:  Christopher [name]   DOB:  02/07/1946   MRN:  [id number]   Chief Complaint: Anemia (SOB x 1 week)  Noted needs follow up for abn chest ct.  Shortness of Breath This is a new problem. The current episode started 1 to 4 weeks ago. The problem occurs daily. The problem has been gradually worsening. Associated symptoms include leg swelling, orthopnea and PND. Pertinent negatives include no abdominal pain, chest pain, claudication, ear pain, fever, headaches, neck pain, rash, sore throat or wheezing. Risk factors include smoking.  Other This is a new problem. Associated symptoms include fatigue. Pertinent negatives include no abdominal pain, chest pain, chills, coughing, diaphoresis, fever, headaches, myalgias, nausea, neck pain, numbness, rash or sore throat. The symptoms are aggravated by walking.  Congestive Heart Failure Presents for follow-up visit. Associated symptoms include edema, fatigue, nocturia, paroxysmal nocturnal dyspnea, shortness of breath and unexpected weight change. Pertinent negatives include no abdominal pain, chest pain, chest pressure, claudication, orthopnea or palpitations. The symptoms have been stable.    Review of Systems  Constitutional: Positive for fatigue and unexpected weight change. Negative for chills, diaphoresis and fever.  HENT: Negative for drooling, ear discharge, ear pain and sore throat.   Respiratory: Positive for shortness of breath. Negative for cough and wheezing.   Cardiovascular: Positive for orthopnea, leg swelling and PND. Negative for chest pain, palpitations and claudication.  Gastrointestinal: Negative for abdominal pain, blood in stool, constipation, diarrhea and nausea.  Endocrine: Negative for polydipsia.  Genitourinary: Positive for nocturia. Negative for dysuria, frequency, hematuria and urgency.  Musculoskeletal: Negative for back pain, myalgias and neck pain.  Skin: Negative for rash.   Allergic/Immunologic: Negative for environmental allergies.  Neurological: Negative for dizziness, numbness and headaches.  Hematological: Does not bruise/bleed easily.  Psychiatric/Behavioral: Negative for suicidal ideas. The patient is not nervous/anxious.     Patient Active Problem List   Diagnosis Date Noted  . Cerebral vascular disease 09/08/2018  . TIA (transient ischemic attack) 12/12/2017  . 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 Encounter for general adult medical examination without abnormal findings 10/22/2014  . Disorder of kidney and ureter 10/22/2014  . Renal colic 72/53/6644  . 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 Creatinine elevation 10/22/2014  . Essential (primary) hypertension 10/22/2014  . H/O hypercholesterolemia 10/22/2014  . Dysmetabolic syndrome 03/47/4259  . H/O acute myocardial infarction 10/22/2014  . Adiposity 10/22/2014  . Impaired renal function 10/22/2014  . Need for vaccination 10/22/2014  . 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 S/P coronary artery stent placement 04/09/2014  . Chronic kidney disease, stage [name] (moderate) (HCC) 01/25/2011    No Known Allergies  Past Surgical History:  Procedure Laterality Date  . COLONOSCOPY  2011  . KNEE ARTHROSCOPY Right 2016    Social History   Tobacco Use  . Smoking status: Former Research scientist (life sciences)  . Smokeless tobacco: Never Used  Substance Use Topics  . Alcohol use: No    Alcohol/week: 0.0 standard drinks  . Drug use: No     Medication list has been reviewed and updated.  No outpatient medications have been marked as taking for the 10/13/18 encounter (Office Visit) with Juline Patch, MD.    [facility] 2/9 Scores 09/08/2018 01/30/2018 01/24/2018 12/12/2017  PHQ - 2 Score 0 1 0 0  PHQ- 9 Score 0 5 0 1    BP Readings from Last 3 Encounters:  10/13/18 104/66  09/08/18 (!) 104/58  03/13/18 120/82    Physical Exam Vitals signs and nursing note reviewed.   HENT:     Head: Normocephalic.     Right Ear: Tympanic membrane, ear canal and external ear normal. There is no impacted cerumen.     Left Ear: Tympanic membrane, ear canal and external ear normal. There is no impacted cerumen.     Nose: Nose normal. No congestion or rhinorrhea.  Eyes:     General: No scleral icterus.       Right eye: No discharge.        Left eye: No discharge.     Conjunctiva/sclera: Conjunctivae normal.     Pupils: Pupils are equal, round, and reactive to light.  Neck:     Musculoskeletal: Normal range of motion and neck supple.     Thyroid: No thyromegaly.     Vascular: No JVD.     Trachea: No tracheal deviation.  Cardiovascular:     Rate and Rhythm: Normal rate and regular rhythm.     Heart sounds: Normal heart sounds. No murmur. No friction rub. No gallop.   Pulmonary:     Effort: No respiratory distress.     Breath sounds: Normal breath sounds. No decreased breath sounds, wheezing, rhonchi or rales.  Abdominal:     General: Bowel sounds are normal.     Palpations: Abdomen is soft. There is no mass.     Tenderness: There is no abdominal tenderness. There is no guarding or rebound.  Musculoskeletal: Normal range of motion.        General: No tenderness.  Lymphadenopathy:     Cervical: No cervical adenopathy.  Skin:    General: Skin is warm.     Coloration: Skin is pale.     Findings: No rash.  Neurological:     Mental Status: He is alert and oriented to person, place, and time.     Cranial Nerves: No cranial nerve deficit.     Deep Tendon Reflexes: Reflexes are normal and symmetric.     Wt Readings from Last 3 Encounters:  10/13/18 (!) 308 lb (139.7 kg)  09/08/18 289 lb (131.1 kg)  03/13/18 (!) 305 lb (138.3 kg)    BP 104/66   Pulse (!) 107   Ht 6' 2.5" (1.892 m)   Wt (!) 308 lb (139.7 kg)   SpO2 99%   BMI 39.02 kg/m   Assessment and Plan:  1. Impaired renal function Patient is currently followed by Dr. Radene Knee at [facility] nephrology clinics.   Patient has had most recently a GFR of 29 8 months ago.  However when patient was seen 1 month ago it was noted that his GFR had decreased to 19 and [facility] nephrology was informed of this and patient was given an appointment for next week.  However today's visit was noted that patient was short of breath and that he had gained some weight suggesting that he may have retained fluid.  At which time which recheck a renal panel today and GFR was noted to have decreased to 9 with a creatinine of 5.77.  Nephrology was called about this sudden drop in his renal function and the decision was made to send to main hospital at [facility] by Dr. Radene Knee and her colleagues.  2. Abnormal chest CT Patient was noted to have an abnormal CT approximately 13 months ago at which time patient was to have had a repeat chest x-ray chest CT we will go ahead and get a chest x-ray today  while we were waiting for nephrology to see if there is any suggestion of congestive heart failure given the patient's worsening symptoms of shortness of breath. - DG Chest 2 View; Future  3. Chronic systolic heart failure (HCC) Over the past week patient's had increasing DOE, with episodes of PND and orthopnea.  As noted above patient was profoundly dyspneic upon walking to the clinic at which time it was noted that he was pale and diaphoretic.  We proceeded with initially looking at a CBC to assess his hemoglobin status as well as a repeat of his renal panel to evaluate his GFR.  Patient has had a 19 pound weight gain since last seen in clinic 1 month ago.  This would explain his worsening of congestive heart failure symptoms and is likely due to his decreasing GFR.  4. History of anemia due to chronic kidney disease Patient was noted to have a history of anemia we were concerned that he may have had an even further decrease but upon checking his CBC his hemoglobin was noted to be 10.8 presumably from his renal insufficiency.  5. 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 Clayborn Bigness in [place].  Patient has an appointment next week but given the circumstances of his renal concerns this will likely need to be changed. I spent 90 minutes with this patient, More than 50% of that time was spent in face to face education, counseling and care coordination. Current disposition patient is proceeding to [facility] for evaluation of significant worsening of GFR and likely dialysis.  Because of the urgent nature of the patient's condition patient was sent directly to emergency room for evaluation after consultation with Dr. Radene Knee.

## 2018-10-14 DIAGNOSIS — Z8673 Personal history of transient ischemic attack (TIA), and cerebral infarction without residual deficits: Secondary | ICD-10-CM | POA: Insufficient documentation

## 2018-10-14 DIAGNOSIS — N179 Acute kidney failure, unspecified: Secondary | ICD-10-CM | POA: Insufficient documentation

## 2018-10-14 DIAGNOSIS — N183 Chronic kidney disease, stage 3 unspecified: Secondary | ICD-10-CM | POA: Insufficient documentation

## 2018-10-14 DIAGNOSIS — I4892 Unspecified atrial flutter: Secondary | ICD-10-CM | POA: Insufficient documentation

## 2018-10-14 DIAGNOSIS — E8770 Fluid overload, unspecified: Secondary | ICD-10-CM | POA: Insufficient documentation

## 2018-10-14 DIAGNOSIS — I5022 Chronic systolic (congestive) heart failure: Secondary | ICD-10-CM | POA: Insufficient documentation

## 2018-10-16 MED ORDER — POLYETHYLENE GLYCOL 3350 17 G PO PACK
17.00 | PACK | ORAL | Status: DC
Start: ? — End: 2018-10-16

## 2018-10-16 MED ORDER — MELATONIN 3 MG PO TABS
3.00 | ORAL_TABLET | ORAL | Status: DC
Start: ? — End: 2018-10-16

## 2018-10-16 MED ORDER — CLOPIDOGREL BISULFATE 75 MG PO TABS
75.00 | ORAL_TABLET | ORAL | Status: DC
Start: 2018-10-20 — End: 2018-10-16

## 2018-10-16 MED ORDER — FUROSEMIDE 10 MG/ML IJ SOLN
60.00 | INTRAMUSCULAR | Status: DC
Start: 2018-10-16 — End: 2018-10-16

## 2018-10-16 MED ORDER — METOPROLOL TARTRATE 25 MG PO TABS
25.00 | ORAL_TABLET | ORAL | Status: DC
Start: 2018-10-19 — End: 2018-10-16

## 2018-10-16 MED ORDER — ACETAMINOPHEN 325 MG PO TABS
650.00 | ORAL_TABLET | ORAL | Status: DC
Start: ? — End: 2018-10-16

## 2018-10-16 MED ORDER — AMIODARONE HCL 200 MG PO TABS
400.00 | ORAL_TABLET | ORAL | Status: DC
Start: 2018-10-20 — End: 2018-10-16

## 2018-10-16 MED ORDER — ATORVASTATIN CALCIUM 80 MG PO TABS
80.00 | ORAL_TABLET | ORAL | Status: DC
Start: 2018-10-19 — End: 2018-10-16

## 2018-10-16 MED ORDER — HEPARIN SODIUM (PORCINE) 10000 UNIT/ML IJ SOLN
7500.00 | INTRAMUSCULAR | Status: DC
Start: 2018-10-16 — End: 2018-10-16

## 2018-10-16 MED ORDER — SODIUM BICARBONATE 650 MG PO TABS
650.00 | ORAL_TABLET | ORAL | Status: DC
Start: 2018-10-19 — End: 2018-10-16

## 2018-10-19 MED ORDER — APIXABAN 5 MG PO TABS
5.00 | ORAL_TABLET | ORAL | Status: DC
Start: 2018-10-19 — End: 2018-10-19

## 2018-10-21 DIAGNOSIS — Z8673 Personal history of transient ischemic attack (TIA), and cerebral infarction without residual deficits: Secondary | ICD-10-CM | POA: Diagnosis not present

## 2018-10-21 DIAGNOSIS — E669 Obesity, unspecified: Secondary | ICD-10-CM | POA: Diagnosis not present

## 2018-10-21 DIAGNOSIS — M109 Gout, unspecified: Secondary | ICD-10-CM | POA: Diagnosis not present

## 2018-10-21 DIAGNOSIS — I2510 Atherosclerotic heart disease of native coronary artery without angina pectoris: Secondary | ICD-10-CM | POA: Diagnosis not present

## 2018-10-21 DIAGNOSIS — N179 Acute kidney failure, unspecified: Secondary | ICD-10-CM | POA: Diagnosis not present

## 2018-10-21 DIAGNOSIS — Z955 Presence of coronary angioplasty implant and graft: Secondary | ICD-10-CM | POA: Diagnosis not present

## 2018-10-21 DIAGNOSIS — I252 Old myocardial infarction: Secondary | ICD-10-CM | POA: Diagnosis not present

## 2018-10-21 DIAGNOSIS — I472 Ventricular tachycardia: Secondary | ICD-10-CM | POA: Diagnosis not present

## 2018-10-21 DIAGNOSIS — I5022 Chronic systolic (congestive) heart failure: Secondary | ICD-10-CM | POA: Diagnosis not present

## 2018-10-21 DIAGNOSIS — N183 Chronic kidney disease, stage 3 (moderate): Secondary | ICD-10-CM | POA: Diagnosis not present

## 2018-10-21 DIAGNOSIS — I130 Hypertensive heart and chronic kidney disease with heart failure and stage 1 through stage 4 chronic kidney disease, or unspecified chronic kidney disease: Secondary | ICD-10-CM | POA: Diagnosis not present

## 2018-10-21 DIAGNOSIS — I4892 Unspecified atrial flutter: Secondary | ICD-10-CM | POA: Diagnosis not present

## 2018-10-21 DIAGNOSIS — M170 Bilateral primary osteoarthritis of knee: Secondary | ICD-10-CM | POA: Diagnosis not present

## 2018-10-23 ENCOUNTER — Ambulatory Visit (INDEPENDENT_AMBULATORY_CARE_PROVIDER_SITE_OTHER): Payer: Medicare Other | Admitting: Family Medicine

## 2018-10-23 ENCOUNTER — Encounter: Payer: Self-pay | Admitting: Family Medicine

## 2018-10-23 ENCOUNTER — Other Ambulatory Visit: Payer: Self-pay

## 2018-10-23 VITALS — BP 100/68 | HR 66 | Ht 74.5 in | Wt 277.0 lb

## 2018-10-23 DIAGNOSIS — Z09 Encounter for follow-up examination after completed treatment for conditions other than malignant neoplasm: Secondary | ICD-10-CM | POA: Diagnosis not present

## 2018-10-23 DIAGNOSIS — N183 Chronic kidney disease, stage 3 unspecified: Secondary | ICD-10-CM

## 2018-10-23 DIAGNOSIS — I2510 Atherosclerotic heart disease of native coronary artery without angina pectoris: Secondary | ICD-10-CM

## 2018-10-23 DIAGNOSIS — I10 Essential (primary) hypertension: Secondary | ICD-10-CM

## 2018-10-23 DIAGNOSIS — I509 Heart failure, unspecified: Secondary | ICD-10-CM

## 2018-10-23 NOTE — Progress Notes (Signed)
Date:  10/23/2018   Name:  Christopher [name]   DOB:  1945/10/25   MRN:  [id number]   Chief Complaint: Follow-up (hospital d/c 10/19/2018- f/up cardio on 13 and f/up neph on 18th of Aug)  Pt was recently admitted to [facility] on 10/13/18 for CHF/AKD/CKD and was discharged on 10/19/18. Transition of care call placed on 10/20/18.   Congestive Heart Failure Presents for follow-up visit. Associated symptoms include fatigue. Pertinent negatives include no chest pressure, claudication, edema, muscle weakness, near-syncope, nocturia, orthopnea, palpitations, paroxysmal nocturnal dyspnea, shortness of breath or unexpected weight change. (S/p 25 lb diuresis) The symptoms have been stable.    Review of Systems  Constitutional: Positive for fatigue. Negative for chills and unexpected weight change.  HENT: Negative for congestion, dental problem, drooling, ear discharge, ear pain, facial swelling, hearing loss, mouth sores, nosebleeds, postnasal drip, rhinorrhea, sinus pressure, sinus pain, sneezing, sore throat, tinnitus, trouble swallowing and voice change.   Respiratory: Negative for cough, shortness of breath and wheezing.   Cardiovascular: Negative for palpitations, claudication, leg swelling and near-syncope.  Gastrointestinal: Negative for blood in stool, constipation, diarrhea and nausea.  Endocrine: Negative for polydipsia.  Genitourinary: Negative for frequency, hematuria, nocturia and urgency.  Musculoskeletal: Negative for back pain, myalgias, muscle weakness and neck pain.  Skin: Negative for rash.  Allergic/Immunologic: Negative for environmental allergies.  Neurological: Negative for dizziness.  Hematological: Does not bruise/bleed easily.  Psychiatric/Behavioral: Negative for suicidal ideas. The patient is not nervous/anxious.     Patient Active Problem List   Diagnosis Date Noted  . Cerebral vascular disease 09/08/2018  . TIA (transient ischemic attack) 12/12/2017  .  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 Encounter for general adult medical examination without abnormal findings 10/22/2014  . Disorder of kidney and ureter 10/22/2014  . Renal colic 03/47/4259  . 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 Creatinine elevation 10/22/2014  . Essential (primary) hypertension 10/22/2014  . H/O hypercholesterolemia 10/22/2014  . Dysmetabolic syndrome 56/38/7564  . H/O acute myocardial infarction 10/22/2014  . Adiposity 10/22/2014  . Impaired renal function 10/22/2014  . Need for vaccination 10/22/2014  . 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 S/P coronary artery stent placement 04/09/2014  . Chronic kidney disease, stage [name] (moderate) (HCC) 01/25/2011    No Known Allergies  Past Surgical History:  Procedure Laterality Date  . COLONOSCOPY  2011  . KNEE ARTHROSCOPY Right 2016    Social History   Tobacco Use  . Smoking status: Former Research scientist (life sciences)  . Smokeless tobacco: Never Used  Substance Use Topics  . Alcohol use: No    Alcohol/week: 0.0 standard drinks  . Drug use: No     Medication list has been reviewed and updated.  Current Meds  Medication Sig  . allopurinol (ZYLOPRIM) 100 MG tablet Take by mouth.  Marland Kitchen amiodarone (PACERONE) 400 MG tablet Take 1 tablet by mouth daily. cardio  . apixaban (ELIQUIS) 5 MG TABS tablet Take 1 tablet by mouth 2 (two) times daily. cardio  . atorvastatin (LIPITOR) 80 MG tablet Take 1 tablet by mouth daily. cardio  . clopidogrel (PLAVIX) 75 MG tablet One a day (Patient taking differently: One a day/ cardio)  . diclofenac sodium (VOLTAREN) 1 % GEL Apply 4 g topically 4 (four) times daily.  . metoprolol succinate (TOPROL-XL) 100 MG 24 hr tablet TAKE 1 TABLET BY MOUTH EVERY DAY IMMEDIATELY FOLLOWING A MEAL  . sodium bicarbonate 650 MG tablet Take 1 tablet by mouth 2 (  two) times daily. Kidney specialist    PHQ 2/9 Scores 09/08/2018 01/30/2018 01/24/2018 12/12/2017  PHQ - 2 Score 0  1 0 0  PHQ- 9 Score 0 5 0 1    BP Readings from Last 3 Encounters:  10/23/18 100/68  10/13/18 104/66  09/08/18 (!) 104/58    Physical Exam Vitals signs and nursing note reviewed.  Constitutional:      Appearance: He is normal weight.  HENT:     Head: Normocephalic.     Right Ear: Tympanic membrane, ear canal and external ear normal. There is no impacted cerumen.     Left Ear: Tympanic membrane, ear canal and external ear normal. There is no impacted cerumen.     Nose: Nose normal. No congestion or rhinorrhea.     Mouth/Throat:     Mouth: Mucous membranes are moist.  Eyes:     General: No scleral icterus.       Right eye: No discharge.        Left eye: No discharge.     Conjunctiva/sclera: Conjunctivae normal.     Pupils: Pupils are equal, round, and reactive to light.  Neck:     Musculoskeletal: Normal range of motion and neck supple.     Thyroid: No thyromegaly.     Vascular: No JVD.     Trachea: No tracheal deviation.  Cardiovascular:     Rate and Rhythm: Normal rate and regular rhythm.     Heart sounds: Normal heart sounds. No murmur. No friction rub. No gallop.   Pulmonary:     Effort: No respiratory distress.     Breath sounds: Normal breath sounds. No wheezing, rhonchi or rales.  Abdominal:     General: Bowel sounds are normal.     Palpations: Abdomen is soft. There is no mass.     Tenderness: There is no abdominal tenderness. There is no guarding or rebound.  Musculoskeletal: Normal range of motion.        General: No tenderness.  Lymphadenopathy:     Cervical: No cervical adenopathy.  Skin:    General: Skin is warm.     Capillary Refill: Capillary refill takes less than 2 seconds.     Coloration: Skin is not jaundiced or pale.     Findings: No rash.  Neurological:     Mental Status: He is alert and oriented to person, place, and time.     Cranial Nerves: No cranial nerve deficit.     Motor: No weakness.     Deep Tendon Reflexes: Reflexes are normal and  symmetric.  Psychiatric:        Mood and Affect: Mood normal.     Wt Readings from Last 3 Encounters:  10/23/18 277 lb (125.6 kg)  10/13/18 (!) 308 lb (139.7 kg)  09/08/18 289 lb (131.1 kg)    BP 100/68   Pulse 66   Ht 6' 2.5" (1.892 m)   Wt 277 lb (125.6 kg)   BMI 35.09 kg/m   Assessment and Plan: 1. Hospital discharge follow-up Patient is discharged after a week hospitalization at [facility] for acute on chronic kidney disease and chronic congestive heart failure exacerbation.  Patient had a major diuresis of greater than 25 pounds is doing well with improved symptomatology and wellbeing.  Will repeat renal function panel to see status of creatinine and GFR.  Patient's previous hospitalization discharge was reviewed as well as labs and other tests. - Renal Function Panel  2. Chronic kidney disease,  stage [name] (moderate) [facility]) Patient is followed by [facility] for renal disease by Dr. Radene Knee.  Patient had a major diuresis because of the acute injury of the kidneys because of decrease cardiac output from chronic congestive heart failure worsening. - Renal Function Panel  3. Chronic congestive heart failure, unspecified heart failure type [facility]) Patient is followed by Dr. Clayborn Bigness for congestive heart failure.  Patient's had multiple changes on medication and is scheduled to see Dr. Towanda Malkin later this week for follow-up. - Renal Function Panel  4. Essential (primary) hypertension Patient is noted to be somewhat hypo-tensive in the 100/70 range on his blood pressure.  We will check his creatinine to make sure that there is no prerenal possibility and further evaluate in 6 weeks.

## 2018-10-24 DIAGNOSIS — N183 Chronic kidney disease, stage 3 (moderate): Secondary | ICD-10-CM | POA: Diagnosis not present

## 2018-10-24 DIAGNOSIS — N179 Acute kidney failure, unspecified: Secondary | ICD-10-CM | POA: Diagnosis not present

## 2018-10-24 DIAGNOSIS — I130 Hypertensive heart and chronic kidney disease with heart failure and stage 1 through stage 4 chronic kidney disease, or unspecified chronic kidney disease: Secondary | ICD-10-CM | POA: Diagnosis not present

## 2018-10-24 DIAGNOSIS — I472 Ventricular tachycardia: Secondary | ICD-10-CM | POA: Diagnosis not present

## 2018-10-24 DIAGNOSIS — I5022 Chronic systolic (congestive) heart failure: Secondary | ICD-10-CM | POA: Diagnosis not present

## 2018-10-24 DIAGNOSIS — I2510 Atherosclerotic heart disease of native coronary artery without angina pectoris: Secondary | ICD-10-CM | POA: Diagnosis not present

## 2018-10-24 LAB — RENAL FUNCTION PANEL
Albumin: 4.2 g/dL (ref 3.7–4.7)
BUN/Creatinine Ratio: 22 (ref 10–24)
BUN: 74 mg/dL — ABNORMAL HIGH (ref 8–27)
CO2: 20 mmol/L (ref 20–29)
Calcium: 9.8 mg/dL (ref 8.6–10.2)
Chloride: 100 mmol/L (ref 96–106)
Creatinine, Ser: 3.3 mg/dL — ABNORMAL HIGH (ref 0.76–1.27)
GFR calc Af Amer: 21 mL/min/{1.73_m2} — ABNORMAL LOW (ref >59)
GFR calc non Af Amer: 18 mL/min/{1.73_m2} — ABNORMAL LOW (ref >59)
Glucose: 92 mg/dL (ref 65–99)
Phosphorus: 3.9 mg/dL (ref 2.8–4.1)
Potassium: 4.9 mmol/L (ref 3.5–5.2)
Sodium: 139 mmol/L (ref 134–144)

## 2018-10-26 DIAGNOSIS — E6609 Other obesity due to excess calories: Secondary | ICD-10-CM | POA: Diagnosis not present

## 2018-10-26 DIAGNOSIS — I25118 Atherosclerotic heart disease of native coronary artery with other forms of angina pectoris: Secondary | ICD-10-CM | POA: Diagnosis not present

## 2018-10-26 DIAGNOSIS — I739 Peripheral vascular disease, unspecified: Secondary | ICD-10-CM | POA: Diagnosis not present

## 2018-10-26 DIAGNOSIS — I472 Ventricular tachycardia: Secondary | ICD-10-CM | POA: Diagnosis not present

## 2018-10-26 DIAGNOSIS — I255 Ischemic cardiomyopathy: Secondary | ICD-10-CM | POA: Diagnosis not present

## 2018-10-26 DIAGNOSIS — I639 Cerebral infarction, unspecified: Secondary | ICD-10-CM | POA: Diagnosis not present

## 2018-10-26 DIAGNOSIS — I208 Other forms of angina pectoris: Secondary | ICD-10-CM | POA: Diagnosis not present

## 2018-10-26 DIAGNOSIS — N179 Acute kidney failure, unspecified: Secondary | ICD-10-CM | POA: Diagnosis not present

## 2018-10-26 DIAGNOSIS — I5022 Chronic systolic (congestive) heart failure: Secondary | ICD-10-CM | POA: Diagnosis not present

## 2018-10-26 DIAGNOSIS — I130 Hypertensive heart and chronic kidney disease with heart failure and stage 1 through stage 4 chronic kidney disease, or unspecified chronic kidney disease: Secondary | ICD-10-CM | POA: Diagnosis not present

## 2018-10-26 DIAGNOSIS — M1711 Unilateral primary osteoarthritis, right knee: Secondary | ICD-10-CM | POA: Diagnosis not present

## 2018-10-26 DIAGNOSIS — R600 Localized edema: Secondary | ICD-10-CM | POA: Diagnosis not present

## 2018-10-26 DIAGNOSIS — G459 Transient cerebral ischemic attack, unspecified: Secondary | ICD-10-CM | POA: Diagnosis not present

## 2018-10-26 DIAGNOSIS — N183 Chronic kidney disease, stage 3 (moderate): Secondary | ICD-10-CM | POA: Diagnosis not present

## 2018-10-26 DIAGNOSIS — I2510 Atherosclerotic heart disease of native coronary artery without angina pectoris: Secondary | ICD-10-CM | POA: Diagnosis not present

## 2018-10-26 DIAGNOSIS — I509 Heart failure, unspecified: Secondary | ICD-10-CM | POA: Diagnosis not present

## 2018-10-26 DIAGNOSIS — R0602 Shortness of breath: Secondary | ICD-10-CM | POA: Diagnosis not present

## 2018-10-30 ENCOUNTER — Inpatient Hospital Stay: Payer: Medicare Other | Admitting: Family Medicine

## 2018-10-31 DIAGNOSIS — Z955 Presence of coronary angioplasty implant and graft: Secondary | ICD-10-CM | POA: Diagnosis not present

## 2018-10-31 DIAGNOSIS — Z8673 Personal history of transient ischemic attack (TIA), and cerebral infarction without residual deficits: Secondary | ICD-10-CM | POA: Diagnosis not present

## 2018-10-31 DIAGNOSIS — Z87891 Personal history of nicotine dependence: Secondary | ICD-10-CM | POA: Diagnosis not present

## 2018-10-31 DIAGNOSIS — I10 Essential (primary) hypertension: Secondary | ICD-10-CM | POA: Diagnosis not present

## 2018-10-31 DIAGNOSIS — E785 Hyperlipidemia, unspecified: Secondary | ICD-10-CM | POA: Diagnosis not present

## 2018-10-31 DIAGNOSIS — N179 Acute kidney failure, unspecified: Secondary | ICD-10-CM | POA: Diagnosis not present

## 2018-10-31 DIAGNOSIS — Z79899 Other long term (current) drug therapy: Secondary | ICD-10-CM | POA: Diagnosis not present

## 2018-10-31 DIAGNOSIS — M1A0790 Idiopathic chronic gout, unspecified ankle and foot, without tophus (tophi): Secondary | ICD-10-CM | POA: Diagnosis not present

## 2018-10-31 DIAGNOSIS — I252 Old myocardial infarction: Secondary | ICD-10-CM | POA: Diagnosis not present

## 2018-10-31 DIAGNOSIS — N183 Chronic kidney disease, stage 3 (moderate): Secondary | ICD-10-CM | POA: Diagnosis not present

## 2018-10-31 DIAGNOSIS — I2510 Atherosclerotic heart disease of native coronary artery without angina pectoris: Secondary | ICD-10-CM | POA: Diagnosis not present

## 2018-10-31 DIAGNOSIS — Z7901 Long term (current) use of anticoagulants: Secondary | ICD-10-CM | POA: Diagnosis not present

## 2018-10-31 DIAGNOSIS — I129 Hypertensive chronic kidney disease with stage 1 through stage 4 chronic kidney disease, or unspecified chronic kidney disease: Secondary | ICD-10-CM | POA: Diagnosis not present

## 2018-11-01 DIAGNOSIS — I472 Ventricular tachycardia: Secondary | ICD-10-CM | POA: Diagnosis not present

## 2018-11-01 DIAGNOSIS — I2510 Atherosclerotic heart disease of native coronary artery without angina pectoris: Secondary | ICD-10-CM | POA: Diagnosis not present

## 2018-11-01 DIAGNOSIS — N183 Chronic kidney disease, stage 3 (moderate): Secondary | ICD-10-CM | POA: Diagnosis not present

## 2018-11-01 DIAGNOSIS — I130 Hypertensive heart and chronic kidney disease with heart failure and stage 1 through stage 4 chronic kidney disease, or unspecified chronic kidney disease: Secondary | ICD-10-CM | POA: Diagnosis not present

## 2018-11-01 DIAGNOSIS — I5022 Chronic systolic (congestive) heart failure: Secondary | ICD-10-CM | POA: Diagnosis not present

## 2018-11-01 DIAGNOSIS — N179 Acute kidney failure, unspecified: Secondary | ICD-10-CM | POA: Diagnosis not present

## 2018-11-02 ENCOUNTER — Telehealth: Payer: Self-pay

## 2018-11-02 NOTE — Telephone Encounter (Signed)
Pt called stating his creatinine is 2.1 at neph- started him back on torsemide, unsure of dosing/ 10mg  or 20?

## 2018-11-03 DIAGNOSIS — N183 Chronic kidney disease, stage 3 (moderate): Secondary | ICD-10-CM | POA: Diagnosis not present

## 2018-11-03 DIAGNOSIS — N179 Acute kidney failure, unspecified: Secondary | ICD-10-CM | POA: Diagnosis not present

## 2018-11-03 DIAGNOSIS — I5022 Chronic systolic (congestive) heart failure: Secondary | ICD-10-CM | POA: Diagnosis not present

## 2018-11-03 DIAGNOSIS — I130 Hypertensive heart and chronic kidney disease with heart failure and stage 1 through stage 4 chronic kidney disease, or unspecified chronic kidney disease: Secondary | ICD-10-CM | POA: Diagnosis not present

## 2018-11-03 DIAGNOSIS — I472 Ventricular tachycardia: Secondary | ICD-10-CM | POA: Diagnosis not present

## 2018-11-03 DIAGNOSIS — I2510 Atherosclerotic heart disease of native coronary artery without angina pectoris: Secondary | ICD-10-CM | POA: Diagnosis not present

## 2018-11-06 DIAGNOSIS — I130 Hypertensive heart and chronic kidney disease with heart failure and stage 1 through stage 4 chronic kidney disease, or unspecified chronic kidney disease: Secondary | ICD-10-CM | POA: Diagnosis not present

## 2018-11-06 DIAGNOSIS — N183 Chronic kidney disease, stage 3 (moderate): Secondary | ICD-10-CM | POA: Diagnosis not present

## 2018-11-06 DIAGNOSIS — I2510 Atherosclerotic heart disease of native coronary artery without angina pectoris: Secondary | ICD-10-CM | POA: Diagnosis not present

## 2018-11-06 DIAGNOSIS — I5022 Chronic systolic (congestive) heart failure: Secondary | ICD-10-CM | POA: Diagnosis not present

## 2018-11-06 DIAGNOSIS — I472 Ventricular tachycardia: Secondary | ICD-10-CM | POA: Diagnosis not present

## 2018-11-06 DIAGNOSIS — N179 Acute kidney failure, unspecified: Secondary | ICD-10-CM | POA: Diagnosis not present

## 2018-11-16 ENCOUNTER — Other Ambulatory Visit: Payer: Self-pay

## 2018-11-16 MED ORDER — APIXABAN 5 MG PO TABS: 5.0000 mg | ORAL_TABLET | Freq: Two times a day (BID) | ORAL | 2 refills | Status: DC

## 2018-11-16 MED ORDER — ATORVASTATIN CALCIUM 80 MG PO TABS: 80.0000 mg | ORAL_TABLET | Freq: Every day | ORAL | 2 refills | Status: DC

## 2018-11-29 DIAGNOSIS — N179 Acute kidney failure, unspecified: Secondary | ICD-10-CM | POA: Diagnosis not present

## 2018-11-29 DIAGNOSIS — N183 Chronic kidney disease, stage 3 (moderate): Secondary | ICD-10-CM | POA: Diagnosis not present

## 2018-11-29 DIAGNOSIS — I129 Hypertensive chronic kidney disease with stage 1 through stage 4 chronic kidney disease, or unspecified chronic kidney disease: Secondary | ICD-10-CM | POA: Diagnosis not present

## 2018-11-29 DIAGNOSIS — M1A0790 Idiopathic chronic gout, unspecified ankle and foot, without tophus (tophi): Secondary | ICD-10-CM | POA: Diagnosis not present

## 2018-12-01 DIAGNOSIS — E785 Hyperlipidemia, unspecified: Secondary | ICD-10-CM | POA: Diagnosis not present

## 2018-12-01 DIAGNOSIS — N183 Chronic kidney disease, stage 3 (moderate): Secondary | ICD-10-CM | POA: Diagnosis not present

## 2018-12-01 DIAGNOSIS — I483 Typical atrial flutter: Secondary | ICD-10-CM | POA: Diagnosis not present

## 2018-12-01 DIAGNOSIS — I5022 Chronic systolic (congestive) heart failure: Secondary | ICD-10-CM | POA: Diagnosis not present

## 2018-12-01 DIAGNOSIS — N179 Acute kidney failure, unspecified: Secondary | ICD-10-CM | POA: Diagnosis not present

## 2018-12-01 DIAGNOSIS — I639 Cerebral infarction, unspecified: Secondary | ICD-10-CM | POA: Diagnosis not present

## 2018-12-01 DIAGNOSIS — I2510 Atherosclerotic heart disease of native coronary artery without angina pectoris: Secondary | ICD-10-CM | POA: Diagnosis not present

## 2018-12-01 DIAGNOSIS — I10 Essential (primary) hypertension: Secondary | ICD-10-CM | POA: Diagnosis not present

## 2018-12-01 DIAGNOSIS — I5020 Unspecified systolic (congestive) heart failure: Secondary | ICD-10-CM | POA: Diagnosis not present

## 2018-12-01 DIAGNOSIS — Z9889 Other specified postprocedural states: Secondary | ICD-10-CM | POA: Diagnosis not present

## 2018-12-01 DIAGNOSIS — Z95828 Presence of other vascular implants and grafts: Secondary | ICD-10-CM | POA: Diagnosis not present

## 2018-12-01 DIAGNOSIS — I130 Hypertensive heart and chronic kidney disease with heart failure and stage 1 through stage 4 chronic kidney disease, or unspecified chronic kidney disease: Secondary | ICD-10-CM | POA: Diagnosis not present

## 2018-12-04 DIAGNOSIS — I5022 Chronic systolic (congestive) heart failure: Secondary | ICD-10-CM | POA: Diagnosis not present

## 2018-12-04 DIAGNOSIS — I5020 Unspecified systolic (congestive) heart failure: Secondary | ICD-10-CM | POA: Diagnosis not present

## 2018-12-04 DIAGNOSIS — N183 Chronic kidney disease, stage 3 (moderate): Secondary | ICD-10-CM | POA: Diagnosis not present

## 2018-12-04 DIAGNOSIS — I10 Essential (primary) hypertension: Secondary | ICD-10-CM | POA: Diagnosis not present

## 2018-12-06 ENCOUNTER — Ambulatory Visit: Payer: Medicare Other | Admitting: Family Medicine

## 2018-12-18 DIAGNOSIS — I5022 Chronic systolic (congestive) heart failure: Secondary | ICD-10-CM | POA: Diagnosis not present

## 2018-12-18 DIAGNOSIS — I5020 Unspecified systolic (congestive) heart failure: Secondary | ICD-10-CM | POA: Diagnosis not present

## 2018-12-19 DIAGNOSIS — I5022 Chronic systolic (congestive) heart failure: Secondary | ICD-10-CM | POA: Diagnosis not present

## 2018-12-19 DIAGNOSIS — I255 Ischemic cardiomyopathy: Secondary | ICD-10-CM | POA: Insufficient documentation

## 2018-12-25 DIAGNOSIS — I5022 Chronic systolic (congestive) heart failure: Secondary | ICD-10-CM | POA: Diagnosis not present

## 2018-12-25 DIAGNOSIS — R931 Abnormal findings on diagnostic imaging of heart and coronary circulation: Secondary | ICD-10-CM | POA: Diagnosis not present

## 2018-12-25 DIAGNOSIS — I700 Atherosclerosis of aorta: Secondary | ICD-10-CM | POA: Diagnosis not present

## 2018-12-25 DIAGNOSIS — I517 Cardiomegaly: Secondary | ICD-10-CM | POA: Diagnosis not present

## 2019-01-01 DIAGNOSIS — Z23 Encounter for immunization: Secondary | ICD-10-CM | POA: Diagnosis not present

## 2019-01-03 DIAGNOSIS — I5022 Chronic systolic (congestive) heart failure: Secondary | ICD-10-CM | POA: Diagnosis not present

## 2019-02-13 DIAGNOSIS — I5020 Unspecified systolic (congestive) heart failure: Secondary | ICD-10-CM | POA: Diagnosis not present

## 2019-02-16 DIAGNOSIS — N1832 Chronic kidney disease, stage 3b: Secondary | ICD-10-CM | POA: Diagnosis not present

## 2019-02-16 DIAGNOSIS — M170 Bilateral primary osteoarthritis of knee: Secondary | ICD-10-CM | POA: Diagnosis not present

## 2019-02-16 DIAGNOSIS — I255 Ischemic cardiomyopathy: Secondary | ICD-10-CM | POA: Diagnosis not present

## 2019-02-16 DIAGNOSIS — I5042 Chronic combined systolic (congestive) and diastolic (congestive) heart failure: Secondary | ICD-10-CM | POA: Diagnosis not present

## 2019-02-16 DIAGNOSIS — I639 Cerebral infarction, unspecified: Secondary | ICD-10-CM | POA: Diagnosis not present

## 2019-02-16 DIAGNOSIS — Z955 Presence of coronary angioplasty implant and graft: Secondary | ICD-10-CM | POA: Diagnosis not present

## 2019-02-16 DIAGNOSIS — I2510 Atherosclerotic heart disease of native coronary artery without angina pectoris: Secondary | ICD-10-CM | POA: Diagnosis not present

## 2019-02-16 DIAGNOSIS — I5022 Chronic systolic (congestive) heart failure: Secondary | ICD-10-CM | POA: Diagnosis not present

## 2019-02-16 DIAGNOSIS — I483 Typical atrial flutter: Secondary | ICD-10-CM | POA: Diagnosis not present

## 2019-02-22 DIAGNOSIS — I5042 Chronic combined systolic (congestive) and diastolic (congestive) heart failure: Secondary | ICD-10-CM | POA: Diagnosis not present

## 2019-02-27 ENCOUNTER — Other Ambulatory Visit: Payer: Self-pay | Admitting: Family Medicine

## 2019-03-05 ENCOUNTER — Other Ambulatory Visit: Payer: Self-pay | Admitting: Family Medicine

## 2019-03-05 DIAGNOSIS — M170 Bilateral primary osteoarthritis of knee: Secondary | ICD-10-CM

## 2019-03-13 ENCOUNTER — Encounter: Payer: Self-pay | Admitting: Family Medicine

## 2019-03-13 ENCOUNTER — Other Ambulatory Visit: Payer: Self-pay | Admitting: *Deleted

## 2019-03-13 ENCOUNTER — Other Ambulatory Visit: Payer: Self-pay

## 2019-03-13 ENCOUNTER — Ambulatory Visit (INDEPENDENT_AMBULATORY_CARE_PROVIDER_SITE_OTHER): Payer: Medicare Other | Admitting: Family Medicine

## 2019-03-13 VITALS — BP 120/62 | HR 68 | Ht 74.5 in | Wt 279.0 lb

## 2019-03-13 DIAGNOSIS — E790 Hyperuricemia without signs of inflammatory arthritis and tophaceous disease: Secondary | ICD-10-CM

## 2019-03-13 DIAGNOSIS — L089 Local infection of the skin and subcutaneous tissue, unspecified: Secondary | ICD-10-CM

## 2019-03-13 DIAGNOSIS — Q245 Malformation of coronary vessels: Secondary | ICD-10-CM

## 2019-03-13 DIAGNOSIS — I2510 Atherosclerotic heart disease of native coronary artery without angina pectoris: Secondary | ICD-10-CM | POA: Diagnosis not present

## 2019-03-13 DIAGNOSIS — R9389 Abnormal findings on diagnostic imaging of other specified body structures: Secondary | ICD-10-CM

## 2019-03-13 DIAGNOSIS — G459 Transient cerebral ischemic attack, unspecified: Secondary | ICD-10-CM

## 2019-03-13 DIAGNOSIS — R911 Solitary pulmonary nodule: Secondary | ICD-10-CM

## 2019-03-13 MED ORDER — AMOXICILLIN-POT CLAVULANATE 875-125 MG PO TABS: 1.0000 | ORAL_TABLET | Freq: Two times a day (BID) | ORAL | 0 refills | Status: DC

## 2019-03-13 MED ORDER — ALLOPURINOL 100 MG PO TABS: ORAL_TABLET | ORAL | 1 refills | Status: DC

## 2019-03-13 MED ORDER — CLOPIDOGREL BISULFATE 75 MG PO TABS: ORAL_TABLET | ORAL | 1 refills | Status: DC

## 2019-03-13 MED ORDER — MUPIROCIN 2 % EX OINT: 1.0000 "application " | TOPICAL_OINTMENT | Freq: Two times a day (BID) | CUTANEOUS | 0 refills | Status: DC

## 2019-03-13 NOTE — Progress Notes (Signed)
Date:  03/13/2019   Name:  Christopher [name]   DOB:  07/07/1945   MRN:  [id number]   Chief Complaint: Gout and Coronary Artery Disease  Patient is a 73 year old male who presents for a follow up pulmonary nodule exam. The patient reports the following problems: no lung concerns. Health maintenance has been reviewed up to date.  Coronary Artery Disease Presents for follow-up visit. Pertinent negatives include no chest pain, chest pressure, chest tightness, dizziness, leg swelling, muscle weakness, palpitations, shortness of breath or weight gain. The symptoms have been stable. Compliance with diet is good. Compliance with exercise is good. Compliance with medications is good.  Arthritis Presents for follow-up (for gout) visit. He complains of pain. He reports no stiffness, joint swelling or joint warmth. The symptoms have been stable. Affected locations include the right ankle, left ankle, right foot and left foot (when occurs). Pertinent negatives include no diarrhea, dry eyes, dry mouth, dysuria, fatigue, fever, pain at night, pain while resting, rash, Raynaud's syndrome, uveitis or weight loss.    Lab Results  Component Value Date   CREATININE 3.30 (H) 10/23/2018   BUN 74 (H) 10/23/2018   NA 139 10/23/2018   K 4.9 10/23/2018   CL 100 10/23/2018   CO2 20 10/23/2018   Lab Results  Component Value Date   CHOL 104 01/24/2018   HDL 21 (L) 01/24/2018   LDLCALC 61 01/24/2018   TRIG 112 01/24/2018   CHOLHDL 5.0 01/24/2018   Lab Results  Component Value Date   TSH 3.423 06/12/2014   No results found for: HGBA1C   Review of Systems  Constitutional: Negative for chills, fatigue, fever, weight gain and weight loss.  HENT: Negative for drooling, ear discharge, ear pain and sore throat.   Respiratory: Negative for cough, chest tightness, shortness of breath and wheezing.   Cardiovascular: Negative for chest pain, palpitations and leg swelling.  Gastrointestinal: Negative for  abdominal pain, blood in stool, constipation, diarrhea and nausea.  Endocrine: Negative for polydipsia.  Genitourinary: Negative for dysuria, frequency, hematuria and urgency.  Musculoskeletal: Positive for arthritis. Negative for back pain, joint swelling, myalgias, muscle weakness, neck pain and stiffness.  Skin: Negative for rash.  Allergic/Immunologic: Negative for environmental allergies.  Neurological: Negative for dizziness and headaches.  Hematological: Does not bruise/bleed easily.  Psychiatric/Behavioral: Negative for suicidal ideas. The patient is not nervous/anxious.     Patient Active Problem List   Diagnosis Date Noted  . Cerebral vascular disease 09/08/2018  . TIA (transient ischemic attack) 12/12/2017  . 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 Encounter for general adult medical examination without abnormal findings 10/22/2014  . Disorder of kidney and ureter 10/22/2014  . Renal colic 24/40/1027  . 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 Creatinine elevation 10/22/2014  . Essential (primary) hypertension 10/22/2014  . H/O hypercholesterolemia 10/22/2014  . Dysmetabolic syndrome 25/36/6440  . H/O acute myocardial infarction 10/22/2014  . Adiposity 10/22/2014  . Impaired renal function 10/22/2014  . Need for vaccination 10/22/2014  . 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 S/P coronary artery stent placement 04/09/2014  . Chronic kidney disease, stage [name] (moderate) 01/25/2011    No Known Allergies  Past Surgical History:  Procedure Laterality Date  . COLONOSCOPY  2011  . KNEE ARTHROSCOPY Right 2016    Social History   Tobacco Use  . Smoking status: Former Research scientist (life sciences)  . Smokeless tobacco: Never Used  Substance Use Topics  . Alcohol use: No  Alcohol/week: 0.0 standard drinks  . Drug use: No     Medication list has been reviewed and updated.  Current Meds  Medication Sig  . allopurinol (ZYLOPRIM) 100 MG tablet  TAKE 1 TABLET(100 MG) BY MOUTH DAILY  . amiodarone (PACERONE) 400 MG tablet Take 1 tablet by mouth daily. cardio  . apixaban (ELIQUIS) 5 MG TABS tablet Take 1 tablet (5 mg total) by mouth 2 (two) times daily. cardio  . atorvastatin (LIPITOR) 80 MG tablet Take 1 tablet (80 mg total) by mouth daily. cardio  . clopidogrel (PLAVIX) 75 MG tablet One a day (Patient taking differently: One a day/ cardio)  . diclofenac sodium (VOLTAREN) 1 % GEL Apply 4 g topically 4 (four) times daily.  . metoprolol succinate (TOPROL-XL) 100 MG 24 hr tablet TAKE 1 TABLET BY MOUTH EVERY DAY IMMEDIATELY FOLLOWING A MEAL (Patient taking differently: Take 150 mg by mouth daily. TAKE 1 1/2 TABLET BY MOUTH EVERY DAY IMMEDIATELY FOLLOWING A MEAL)  . sodium bicarbonate 650 MG tablet Take 1 tablet by mouth 2 (two) times daily. Kidney specialist    PHQ 2/9 Scores 03/13/2019 09/08/2018 01/30/2018 01/24/2018  PHQ - 2 Score 0 0 1 0  PHQ- 9 Score 0 0 5 0    BP Readings from Last 3 Encounters:  03/13/19 120/62  10/23/18 100/68  10/13/18 104/66    Physical Exam Vitals and nursing note reviewed.  HENT:     Head: Normocephalic.     Right Ear: Tympanic membrane, ear canal and external ear normal.     Left Ear: Tympanic membrane, ear canal and external ear normal.     Nose: Nose normal. No congestion or rhinorrhea.  Eyes:     General: No scleral icterus.       Right eye: No discharge.        Left eye: No discharge.     Conjunctiva/sclera: Conjunctivae normal.     Pupils: Pupils are equal, round, and reactive to light.  Neck:     Thyroid: No thyroid mass, thyromegaly or thyroid tenderness.     Vascular: No JVD.     Trachea: No tracheal deviation.  Cardiovascular:     Rate and Rhythm: Normal rate and regular rhythm.     Chest Wall: PMI is not displaced.     Pulses: Normal pulses.     Heart sounds: Normal heart sounds, S1 normal and S2 normal. No murmur. No systolic murmur. No diastolic murmur. No friction rub. No gallop.  No S3 or S4 sounds.   Pulmonary:     Effort: No respiratory distress.     Breath sounds: Normal breath sounds. No transmitted upper airway sounds. No decreased breath sounds, wheezing, rhonchi or rales.  Chest:     Chest wall: No tenderness.  Abdominal:     General: Bowel sounds are normal.     Palpations: Abdomen is soft. There is no mass.     Tenderness: There is no abdominal tenderness. There is no right CVA tenderness, left CVA tenderness, guarding or rebound.  Musculoskeletal:        General: No tenderness. Normal range of motion.     Cervical back: Normal range of motion and neck supple.     Right lower leg: No edema.     Left lower leg: No edema.  Lymphadenopathy:     Cervical: No cervical adenopathy.  Skin:    General: Skin is warm.     Findings: Abrasion and erythema present. No rash.  Comments: Right nasal  Neurological:     Mental Status: He is alert and oriented to person, place, and time.     Cranial Nerves: No cranial nerve deficit.     Deep Tendon Reflexes: Reflexes are normal and symmetric.     Wt Readings from Last 3 Encounters:  03/13/19 279 lb (126.6 kg)  10/23/18 277 lb (125.6 kg)  10/13/18 (!) 308 lb (139.7 kg)    BP 120/62   Pulse 68   Ht 6' 2.5" (1.892 m)   Wt 279 lb (126.6 kg)   SpO2 99%   BMI 35.34 kg/m   Assessment and Plan:  1. TIA (transient ischemic attack) Chronic.  Controlled.  Stable.  Patient with history of TIA is doing well with no residual and no new symptoms.  We will continue Plavix 75 mg once a day. - clopidogrel (PLAVIX) 75 MG tablet; One a day  Dispense: 90 tablet; Refill: 1  2. Coronary artery abnormality As noted on CTs and chest x-rays patient has coronary atherosclerosis.  We will continue Plavix 75 mg 1 a day.  3. Abnormal chest CT On review of chest CT in 2019 there was a nodule for follow-up.  We will contact Sean to see if we can get him into the lung protocol for evaluation.  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  With occasional gout patient has been able to control this with allopurinol 100 mg once a day. - allopurinol (ZYLOPRIM) 100 MG tablet; TAKE 1 TABLET(100 MG) BY MOUTH DAILY  Dispense: 90 tablet; Refill: 1  5. Abrasion, face with infection, initial encounter Patient has an area on the right side of his nose that he scratched which has some surrounding erythema.  Will treat with Bactroban applied twice a day and Augmentin 875 mg twice a day. - mupirocin ointment (BACTROBAN) 2 %; Apply 1 application topically 2 (two) times daily.  Dispense: 22 g; Refill: 0 - amoxicillin-clavulanate (AUGMENTIN) 875-125 MG tablet; Take 1 tablet by mouth 2 (two) times daily.  Dispense: 20 tablet; Refill: 0

## 2019-03-20 ENCOUNTER — Telehealth: Payer: Self-pay | Admitting: *Deleted

## 2019-03-20 ENCOUNTER — Other Ambulatory Visit: Payer: Self-pay | Admitting: Oncology

## 2019-03-20 DIAGNOSIS — R911 Solitary pulmonary nodule: Secondary | ICD-10-CM

## 2019-03-20 NOTE — Progress Notes (Signed)
  Pulmonary Nodule Clinic Telephone Note  Received referral from Dr. Ronnald Ramp.  Patient had CT chest without contrast completed on 07/07/2017 for a lung nodule that was seen on a prior chest x-ray.  He has no history of lung cancer.  A noncalcified 6 mm average nodule seen in the medial left lower lobe.  A noncontrasted chest CT was recommended in 3 to 6 months.  If stable at that time, it was recommended to follow-up within another CT scan in approximately 18 to 24 months from original exam.  This is recommended for moderate to high risk patients.  He was referred to the lung nodule program for surveillance and follow-up.  I personally reviewed his CT chest without contrast from 07/07/2017 and recommend a CT chest without contrast in the next 1 to 2 weeks.  No additional imaging is available for comparison.  He is a former smoker.  Unclear of when he quit.  High risk factors include: History of heavy smoking, exposure to asbestos, radium or uranium, personal family history of lung cancer, older age, sex (females greater than males), race (black and native [place] greater than weight), marginal speculation, upper lobe location, multiplicity (less than 5 nodules increases risk for malignancy) and emphysema and/or pulmonary fibrosis.   This recommendation follows the consensus statement: Guidelines for Management of Incidental Pulmonary Nodules Detected on CT Images: From the Fleischner Society 2017; Radiology 2017; 284:228-243.    I have placed order for CT scan without contrast to be completed approximately 1-2 weeks.  Orders placed. Scheduling message sent to [facility] for next available CT chest without contrast. [facility], lung navigator will contact patient with appointments. I will touch base with patient virtually after appointment to review findings and recommendations.  Faythe Casa, NP 03/20/2019 9:52 AM

## 2019-03-20 NOTE — Telephone Encounter (Signed)
Referral received for pt to be seen in Lung Nodule Clinic for further workup of incidental lung nodule with follow up CT scan. Left message with patient to call back to discuss clinic and review recommendations and upcoming appts including follow up CT scan and visit with Jenny Burns, NP to discuss results. Awaiting call back.  

## 2019-03-22 DIAGNOSIS — N184 Chronic kidney disease, stage 4 (severe): Secondary | ICD-10-CM | POA: Diagnosis not present

## 2019-03-22 DIAGNOSIS — I10 Essential (primary) hypertension: Secondary | ICD-10-CM | POA: Diagnosis not present

## 2019-03-22 DIAGNOSIS — M1A0790 Idiopathic chronic gout, unspecified ankle and foot, without tophus (tophi): Secondary | ICD-10-CM | POA: Diagnosis not present

## 2019-03-30 ENCOUNTER — Telehealth: Payer: Self-pay | Admitting: *Deleted

## 2019-03-30 NOTE — Telephone Encounter (Signed)
Pt made aware of referral to Lung Nodule Clinic from PCP. Pt is in agreement to have upcoming CT scan and follow up as recommended.  Pt has been made aware of upcoming appts for follow up CT scan and follow up appt with Jennifer Burns, NP in the Lung Nodule Clinic. Pt verbalized understanding. Nothing further needed at this time.  

## 2019-04-02 ENCOUNTER — Ambulatory Visit (INDEPENDENT_AMBULATORY_CARE_PROVIDER_SITE_OTHER): Payer: Medicare Other

## 2019-04-02 VITALS — BP 124/79 | Ht 75.0 in | Wt 276.0 lb

## 2019-04-02 DIAGNOSIS — Z0000 Encounter for general adult medical examination without abnormal findings: Secondary | ICD-10-CM | POA: Diagnosis not present

## 2019-04-02 DIAGNOSIS — Z1211 Encounter for screening for malignant neoplasm of colon: Secondary | ICD-10-CM | POA: Diagnosis not present

## 2019-04-02 NOTE — Patient Instructions (Signed)
Mr. Christopher [name] , Thank you for taking time to come for your Medicare Wellness Visit. I appreciate your ongoing commitment to your health goals. Please review the following plan we discussed and let me know if I can assist you in the future.   Screening recommendations/referrals: Colonoscopy: done 2011. Referral sent to [facility] today. They will contact your for an appointment.  Recommended yearly ophthalmology/optometry visit for glaucoma screening and checkup Recommended yearly dental visit for hygiene and checkup  Vaccinations: Influenza vaccine: done 11/14/18 Pneumococcal vaccine: done 12/16/16 Tdap vaccine: done 02/18/16 Shingles vaccine: Shingrix series complete 12/16/17    Advanced directives: Please bring a copy of your health care power of attorney and living will to the office at your convenience.  Conditions/risks identified: Recommend increasing physical activity as tolerated.   Next appointment: Please follow up in one year for your Medicare Annual Wellness visit.    Preventive Care 72 Years and Older, Male Preventive care refers to lifestyle choices and visits with your health care provider that can promote health and wellness. What does preventive care include?  A yearly physical exam. This is also called an annual well check.  Dental exams once or twice a year.  Routine eye exams. Ask your health care provider how often you should have your eyes checked.  Personal lifestyle choices, including:  Daily care of your teeth and gums.  Regular physical activity.  Eating a healthy diet.  Avoiding tobacco and drug use.  Limiting alcohol use.  Practicing safe sex.  Taking low doses of aspirin every day.  Taking vitamin and mineral supplements as recommended by your health care provider. What happens during an annual well check? The services and screenings done by your health care provider during your annual well check will depend on your age, overall  health, lifestyle risk factors, and family history of disease. Counseling  Your health care provider may ask you questions about your:  Alcohol use.  Tobacco use.  Drug use.  Emotional well-being.  Home and relationship well-being.  Sexual activity.  Eating habits.  History of falls.  Memory and ability to understand (cognition).  Work and work Statistician. Screening  You may have the following tests or measurements:  Height, weight, and BMI.  Blood pressure.  Lipid and cholesterol levels. These may be checked every 5 years, or more frequently if you are over 77 years old.  Skin check.  Lung cancer screening. You may have this screening every year starting at age 51 if you have a 30-pack-year history of smoking and currently smoke or have quit within the past 15 years.  Fecal occult blood test (FOBT) of the stool. You may have this test every year starting at age 17.  Flexible sigmoidoscopy or colonoscopy. You may have a sigmoidoscopy every 5 years or a colonoscopy every 10 years starting at age 54.  Prostate cancer screening. Recommendations will vary depending on your family history and other risks.  Hepatitis C blood test.  Hepatitis B blood test.  Sexually transmitted disease (STD) testing.  Diabetes screening. This is done by checking your blood sugar (glucose) after you have not eaten for a while (fasting). You may have this done every 1-3 years.  Abdominal aortic aneurysm (AAA) screening. You may need this if you are a current or former smoker.  Osteoporosis. You may be screened starting at age 25 if you are at high risk. Talk with your health care provider about your test results, treatment options, and if necessary, the need  for more tests. Vaccines  Your health care provider may recommend certain vaccines, such as:  Influenza vaccine. This is recommended every year.  Tetanus, diphtheria, and acellular pertussis (Tdap, Td) vaccine. You may need a Td  booster every 10 years.  Zoster vaccine. You may need this after age 8.  Pneumococcal 13-valent conjugate (PCV13) vaccine. One dose is recommended after age 62.  Pneumococcal polysaccharide (PPSV23) vaccine. One dose is recommended after age 70. Talk to your health care provider about which screenings and vaccines you need and how often you need them. This information is not intended to replace advice given to you by your health care provider. Make sure you discuss any questions you have with your health care provider. Document Released: 03/28/2015 Document Revised: 11/19/2015 Document Reviewed: 12/31/2014 Elsevier Interactive Patient Education  2017 [place] Prevention in the Home Falls can cause injuries. They can happen to people of all ages. There are many things you can do to make your home safe and to help prevent falls. What can I do on the outside of my home?  Regularly fix the edges of walkways and driveways and fix any cracks.  Remove anything that might make you trip as you walk through a door, such as a raised step or threshold.  Trim any bushes or trees on the path to your home.  Use bright outdoor lighting.  Clear any walking paths of anything that might make someone trip, such as rocks or tools.  Regularly check to see if handrails are loose or broken. Make sure that both sides of any steps have handrails.  Any raised decks and porches should have guardrails on the edges.  Have any leaves, snow, or ice cleared regularly.  Use sand or salt on walking paths during winter.  Clean up any spills in your garage right away. This includes oil or grease spills. What can I do in the bathroom?  Use night lights.  Install grab bars by the toilet and in the tub and shower. Do not use towel bars as grab bars.  Use non-skid mats or decals in the tub or shower.  If you need to sit down in the shower, use a plastic, non-slip stool.  Keep the floor dry. Clean up  any water that spills on the floor as soon as it happens.  Remove soap buildup in the tub or shower regularly.  Attach bath mats securely with double-sided non-slip rug tape.  Do not have throw rugs and other things on the floor that can make you trip. What can I do in the bedroom?  Use night lights.  Make sure that you have a light by your bed that is easy to reach.  Do not use any sheets or blankets that are too big for your bed. They should not hang down onto the floor.  Have a firm chair that has side arms. You can use this for support while you get dressed.  Do not have throw rugs and other things on the floor that can make you trip. What can I do in the kitchen?  Clean up any spills right away.  Avoid walking on wet floors.  Keep items that you use a lot in easy-to-reach places.  If you need to reach something above you, use a strong step stool that has a grab bar.  Keep electrical cords out of the way.  Do not use floor polish or wax that makes floors slippery. If you must use wax, use  non-skid floor wax.  Do not have throw rugs and other things on the floor that can make you trip. What can I do with my stairs?  Do not leave any items on the stairs.  Make sure that there are handrails on both sides of the stairs and use them. Fix handrails that are broken or loose. Make sure that handrails are as long as the stairways.  Check any carpeting to make sure that it is firmly attached to the stairs. Fix any carpet that is loose or worn.  Avoid having throw rugs at the top or bottom of the stairs. If you do have throw rugs, attach them to the floor with carpet tape.  Make sure that you have a light switch at the top of the stairs and the bottom of the stairs. If you do not have them, ask someone to add them for you. What else can I do to help prevent falls?  Wear shoes that:  Do not have high heels.  Have rubber bottoms.  Are comfortable and fit you well.  Are  closed at the toe. Do not wear sandals.  If you use a stepladder:  Make sure that it is fully opened. Do not climb a closed stepladder.  Make sure that both sides of the stepladder are locked into place.  Ask someone to hold it for you, if possible.  Clearly mark and make sure that you can see:  Any grab bars or handrails.  First and last steps.  Where the edge of each step is.  Use tools that help you move around (mobility aids) if they are needed. These include:  Canes.  Walkers.  Scooters.  Crutches.  Turn on the lights when you go into a dark area. Replace any light bulbs as soon as they burn out.  Set up your furniture so you have a clear path. Avoid moving your furniture around.  If any of your floors are uneven, fix them.  If there are any pets around you, be aware of where they are.  Review your medicines with your doctor. Some medicines can make you feel dizzy. This can increase your chance of falling. Ask your doctor what other things that you can do to help prevent falls. This information is not intended to replace advice given to you by your health care provider. Make sure you discuss any questions you have with your health care provider. Document Released: 12/26/2008 Document Revised: 08/07/2015 Document Reviewed: 04/05/2014 Elsevier Interactive Patient Education  2017 [employer].

## 2019-04-02 NOTE — Progress Notes (Signed)
Subjective:   Christopher [name] is a 74 y.o. male who presents for Medicare Annual/Subsequent preventive examination.  Virtual Visit via Telephone Note  I connected with Christopher [name] on 04/02/19 at  3:20 PM EST by telephone and verified that I am speaking with the correct person using two identifiers.  Medicare Annual Wellness visit completed telephonically due to Covid-19 pandemic.   Location: Patient: home Provider: office   I discussed the limitations, risks, security and privacy concerns of performing an evaluation and management service by telephone and the availability of in person appointments. The patient expressed understanding and agreed to proceed.  Some vital signs may be absent or patient reported.   Clemetine Marker, LPN     Review of Systems:   Cardiac Risk Factors include: advanced age (>74men, >57 women);dyslipidemia;male gender;hypertension;obesity (BMI >30kg/m2)     Objective:    Vitals: BP 124/79   Ht 6\' 3"  (1.905 m)   Wt 276 lb (125.2 kg)   BMI 34.50 kg/m   Body mass index is 34.5 kg/m.  Advanced Directives 04/02/2019 12/12/2017 06/19/2017 10/22/2014  Does Patient Have a Medical Advance Directive? Yes Yes Yes Yes  Type of Paramedic of [place];Living will Healthcare Power of [place] will  Does patient want to make changes to medical advance directive? - No - Patient declined - -  Copy of [place] in Chart? No - copy requested No - copy requested - No - copy requested    Tobacco Social History   Tobacco Use  Smoking Status Former Smoker  Smokeless Tobacco Never Used     Counseling given: Not Answered   Clinical Intake:  Pre-visit preparation completed: Yes  Pain : No/denies pain     BMI - recorded: 34.5 Nutritional Risks: None  How often do you need to have someone help you when you read instructions, pamphlets, or other written materials  from your doctor or pharmacy?: 1 - Never  Interpreter Needed?: No  Information entered by :: Clemetine Marker LPN  Past Medical History:  Diagnosis Date  . Hyperlipidemia   . Hypertension   . Myocardial infarction [facility])    Past Surgical History:  Procedure Laterality Date  . COLONOSCOPY  2011  . KNEE ARTHROSCOPY Right 2016   Family History  Problem Relation Age of Onset  . Cancer Father   . Heart disease Father   . Diabetes Paternal Grandmother    Social History   Socioeconomic History  . Marital status: Widowed    Spouse name: Not on file  . Number of children: 2  . Years of education: Not on file  . Highest education level: Not on file  Occupational History  . Not on file  Tobacco Use  . Smoking status: Former Research scientist (life sciences)  . Smokeless tobacco: Never Used  Substance and Sexual Activity  . Alcohol use: No    Alcohol/week: 0.0 standard drinks  . Drug use: No  . Sexual activity: Never  Other Topics Concern  . Not on file  Social History Narrative  . Not on file   Social Determinants of Health   Financial Resource Strain: Low Risk   . Difficulty of Paying Living Expenses: Not hard at all  Food Insecurity: No Food Insecurity  . Worried About Charity fundraiser in the Last Year: Never true  . Ran Out of Food in the Last Year: Never true  Transportation Needs: No Transportation Needs  .  Lack of Transportation (Medical): No  . Lack of Transportation (Non-Medical): No  Physical Activity: Insufficiently Active  . Days of Exercise per Week: 7 days  . Minutes of Exercise per Session: 10 min  Stress: No Stress Concern Present  . Feeling of Stress : Not at all  Social Connections: Unknown  . Frequency of Communication with Friends and Family: Patient refused  . Frequency of Social Gatherings with Friends and Family: Patient refused  . Attends Religious Services: Patient refused  . Active Member of Clubs or Organizations: Patient refused  . Attends Archivist  Meetings: Patient refused  . Marital Status: Widowed    Outpatient Encounter Medications as of 04/02/2019  Medication Sig  . allopurinol (ZYLOPRIM) 100 MG tablet TAKE 1 TABLET(100 MG) BY MOUTH DAILY  . amiodarone (PACERONE) 200 MG tablet Take 200 mg by mouth daily.  . clopidogrel (PLAVIX) 75 MG tablet One a day  . diclofenac sodium (VOLTAREN) 1 % GEL Apply 4 g topically 4 (four) times daily.  Marland Kitchen ENTRESTO 97-103 MG Take 1 tablet by mouth 2 (two) times daily.  Marland Kitchen etodolac (LODINE) 500 MG tablet Take 500 mg by mouth 2 (two) times daily.  . mupirocin ointment (BACTROBAN) 2 % Apply 1 application topically 2 (two) times daily.  . sodium bicarbonate 650 MG tablet Take 1 tablet by mouth 2 (two) times daily. Kidney specialist  . torsemide (DEMADEX) 20 MG tablet Take 20 mg by mouth daily.  Marland Kitchen apixaban (ELIQUIS) 5 MG TABS tablet Take 1 tablet (5 mg total) by mouth 2 (two) times daily. cardio  . atorvastatin (LIPITOR) 80 MG tablet Take 1 tablet (80 mg total) by mouth daily. cardio  . [DISCONTINUED] amiodarone (PACERONE) 400 MG tablet Take 1 tablet by mouth daily. cardio  . [DISCONTINUED] amoxicillin-clavulanate (AUGMENTIN) 875-125 MG tablet Take 1 tablet by mouth 2 (two) times daily.  . [DISCONTINUED] metoprolol succinate (TOPROL-XL) 100 MG 24 hr tablet TAKE 1 TABLET BY MOUTH EVERY DAY IMMEDIATELY FOLLOWING A MEAL (Patient taking differently: Take 150 mg by mouth daily. TAKE 1 1/2 TABLET BY MOUTH EVERY DAY IMMEDIATELY FOLLOWING A MEAL)   No facility-administered encounter medications on file as of 04/02/2019.    Activities of Daily Living In your present state of health, do you have any difficulty performing the following activities: 04/02/2019  Hearing? N  Comment declines hearing aids  Vision? N  Difficulty concentrating or making decisions? N  Walking or climbing stairs? N  Dressing or bathing? N  Doing errands, shopping? N  Preparing Food and eating ? N  Using the Toilet? N  In the past six  months, have you accidently leaked urine? N  Do you have problems with loss of bowel control? N  Managing your Medications? N  Managing your Finances? N  Housekeeping or managing your Housekeeping? N  Some recent data might be hidden    Patient Care Team: Juline Patch, MD as PCP - General (Family Medicine)   Assessment:   This is a routine wellness examination for [place].  Exercise Activities and Dietary recommendations Current Exercise Habits: Home exercise routine, Type of exercise: Other - see comments(elliptical), Time (Minutes): 10, Frequency (Times/Week): 7, Weekly Exercise (Minutes/Week): 70, Intensity: Mild, Exercise limited by: orthopedic condition(s)  Goals    . Increase physical activity     Pt would like to increase physical activity to 150 minutes per week       Fall Risk Fall Risk  04/02/2019 03/13/2019 01/24/2018 12/12/2017 05/20/2017  Falls in  the past year? 0 0 0 No No  Number falls in past yr: 0 - - - -  Injury with Fall? 0 - - - -  Risk for fall due to : No Fall Risks - - - -  Follow up Falls prevention discussed Falls evaluation completed Falls evaluation completed - -   FALL RISK PREVENTION PERTAINING TO THE HOME:  Any stairs in or around the home? Yes  If so, do they handrails? Yes   Home free of loose throw rugs in walkways, pet beds, electrical cords, etc? Yes  Adequate lighting in your home to reduce risk of falls? Yes   ASSISTIVE DEVICES UTILIZED TO PREVENT FALLS:  Life alert? No  Use of a cane, walker or w/c? No  Grab bars in the bathroom? Yes  Shower chair or bench in shower? Yes  Elevated toilet seat or a handicapped toilet? Yes   DME ORDERS:  DME order needed?  No   TIMED UP AND GO:  Was the test performed? No . Telephonic visit.   Education: Fall risk prevention has been discussed.  Intervention(s) required? No    Depression Screen PHQ 2/9 Scores 04/02/2019 03/13/2019 09/08/2018 01/30/2018  PHQ - 2 Score 0 0 0 1  PHQ- 9 Score -  0 0 5    Cognitive Function - 6CIT deferred for 2021 AWV, pt has no memory issues.         Immunization History  Administered Date(s) Administered  . Influenza, High Dose Seasonal PF 11/18/2016, 01/01/2019  . Influenza,inj,Quad PF,6+ Mos 11/04/2017, 11/14/2018  . Influenza-Unspecified 11/13/2016  . Pneumococcal Conjugate-13 02/18/2016  . Pneumococcal Polysaccharide-23 12/01/2012, 12/16/2016  . Tdap 12/01/2012, 02/18/2016  . Zoster Recombinat (Shingrix) 09/30/2017, 12/16/2017    Qualifies for Shingles Vaccine?  Yes  Shingrix series completed.   Tdap: Up to date  Flu Vaccine: Up to date  Pneumococcal Vaccine: Up to date    Screening Tests Health Maintenance  Topic Date Due  . COLONOSCOPY  03/12/2020 (Originally 08/04/1997)  . TETANUS/TDAP  02/17/2026  . INFLUENZA VACCINE  Completed  . Hepatitis C Screening  Completed  . PNA vac Low Risk Adult  Completed   Cancer Screenings:  Colorectal Screening: Completed 2011. Repeat every 10 years. Referral to GI placed today. Pt aware the office will call re: appt.  Lung Cancer Screening: (Low Dose CT Chest recommended if Age 80-80 years, 30 pack-year currently smoking OR have quit w/in 15years.) does not qualify.   Additional Screening:  Hepatitis C Screening: does qualify; Completed 02/18/16  Vision Screening: Recommended annual ophthalmology exams for early detection of glaucoma and other disorders of the eye. Is the patient up to date with their annual eye exam?  No  Who is the provider or what is the name of the office in which the pt attends annual eye exams? [facility]  Dental Screening: Recommended annual dental exams for proper oral hygiene  Community Resource Referral:  CRR required this visit?  No       Plan:    I have personally reviewed and addressed the Medicare Annual Wellness questionnaire and have noted the following in the patient's chart:  A. Medical and social history B. Use of alcohol, tobacco or  illicit drugs  C. Current medications and supplements D. Functional ability and status E.  Nutritional status F.  Physical activity G. Advance directives H. List of other physicians I.  Hospitalizations, surgeries, and ER visits in previous 12 months J.  Vitals K. Screenings such as hearing  and vision if needed, cognitive and depression L. Referrals and appointments   In addition, I have reviewed and discussed with patient certain preventive protocols, quality metrics, and best practice recommendations. A written personalized care plan for preventive services as well as general preventive health recommendations were provided to patient.   Signed,  Clemetine Marker, LPN  Nurse Health Advisor   Nurse Notes: none

## 2019-04-04 ENCOUNTER — Other Ambulatory Visit: Payer: Self-pay

## 2019-04-04 ENCOUNTER — Ambulatory Visit
Admission: RE | Admit: 2019-04-04 | Discharge: 2019-04-04 | Disposition: A | Discharge status: Home / Self Care | Payer: Medicare Other | Source: Ambulatory Visit | Attending: Oncology | Admitting: Oncology

## 2019-04-04 DIAGNOSIS — R918 Other nonspecific abnormal finding of lung field: Secondary | ICD-10-CM | POA: Diagnosis not present

## 2019-04-04 DIAGNOSIS — R911 Solitary pulmonary nodule: Secondary | ICD-10-CM

## 2019-04-04 DIAGNOSIS — J439 Emphysema, unspecified: Secondary | ICD-10-CM | POA: Diagnosis not present

## 2019-04-05 ENCOUNTER — Inpatient Hospital Stay: Payer: Medicare Other | Attending: Oncology | Admitting: Oncology

## 2019-04-05 DIAGNOSIS — R911 Solitary pulmonary nodule: Secondary | ICD-10-CM | POA: Diagnosis not present

## 2019-04-05 NOTE — Progress Notes (Signed)
Pulmonary Nodule Clinic Consult note Christopher [name]  [phone] [phone]  Patient Care Team: Juline Patch, MD as PCP - General (Family Medicine)   Name of the patient: Christopher [name]  [id number]  January 06, 1946   Date of visit: 04/05/2019   Diagnosis- Lung Nodule  Virtual Visit via Telephone Note   I connected with Christopher [name] on 04/10/19 at 1:30pm by telephone visit and verified that I am speaking with the correct person using two identifiers.   I discussed the limitations, risks, security and privacy concerns of performing an evaluation and management service by telemedicine and the availability of in-person appointments. I also discussed with the patient that there may be a patient responsible charge related to this service. The patient expressed understanding and agreed to proceed.   Other persons participating in the visit and their role in the encounter:   Patient's location: Home  Provider's location: Office  Chief complaint/ Reason for visit- Pulmonary Nodule Clinic Initial Visit  Past Medical History:  Patient was referred by Dr. Ronnald Ramp.  Patient had CT chest without contrast completed on 07/07/2017 for a lung nodule that was seen on a prior chest x-ray.  He has no history of lung cancer.  A noncalcified 6 mm average nodule seen in the medial left lower lobe.  A noncontrast chest CT was recommended in 3 to 6 months.  If stable at that time, it was recommended to follow-up with another CT scan in approximately 18 to 24 months from original exam.  This is recommended for moderate to high risk patients.  He was referred to our program for surveillance.  I personally reviewed his CT chest without contrast from 07/07/2017 and recommend a CT chest without contrast in the next 1 to 2 weeks.  No additional imaging was available for comparison.  He is a former smoker.   Interval history-patient presents to pulmonary nodule clinic to discuss CT  scan results from 04/04/2019.  He is a current non-smoker.  Patient smoked half a pack of cigarettes for 2 to 3 years.  He quit greater than 40 years ago.  He is currently retired.  He worked in [employer] for greater than 45 years.  He denies any known occupational exposures to chemicals known to cause cancer.  He denies any exposure to secondhand smoke.  His dad had colon cancer and his mother had breast cancer.  She had a mastectomy.  No other family history.  No personal history of cancer.  He has a past medical history positive for: Past Medical History:  Diagnosis Date  . Hyperlipidemia   . Hypertension   . Myocardial infarction [facility])    He has a past surgical history positive for: Past Surgical History:  Procedure Laterality Date  . COLONOSCOPY  2011  . KNEE ARTHROSCOPY Right 2016   Currently, patient is doing well.  He denies any symptoms including shortness of breath or cough.  Patient has history of coronary artery disease.  He has been compliant with a good diet and attempting to exercise.  He is compliant with his medications.  He also has history of gout.  Affected locations include right ankle, left ankle, right foot and left foot.  Patient has history of TIA and currently on Plavix 75 mg once a day.  He is taking allopurinol for gout control.  He was recently treated for an abrasion to his face with Bactroban ointment and Augmentin.  Symptoms are currently resolved.  He denies  any neurological complaints, recent fevers or illness.  He denies any chest pain, nausea, vomiting, constipation or diarrhea.  ECOG FS:0 - Asymptomatic  Review of systems- Review of Systems  Constitutional: Negative.  Negative for chills, fever, malaise/fatigue and weight loss.  HENT: Negative for congestion, ear pain and tinnitus.   Eyes: Negative.  Negative for blurred vision and double vision.  Respiratory: Negative.  Negative for cough, sputum production and shortness of breath.     Cardiovascular: Negative.  Negative for chest pain, palpitations and leg swelling.  Gastrointestinal: Negative.  Negative for abdominal pain, constipation, diarrhea, nausea and vomiting.  Genitourinary: Negative for dysuria, frequency and urgency.  Musculoskeletal: Positive for joint pain. Negative for back pain and falls.  Skin: Negative.  Negative for rash.  Neurological: Negative.  Negative for weakness and headaches.  Endo/Heme/Allergies: Negative.  Does not bruise/bleed easily.  Psychiatric/Behavioral: Negative.  Negative for depression. The patient is not nervous/anxious and does not have insomnia.      No Known Allergies   Past Medical History:  Diagnosis Date  . Hyperlipidemia   . Hypertension   . Myocardial infarction [name])      Past Surgical History:  Procedure Laterality Date  . COLONOSCOPY  2011  . KNEE ARTHROSCOPY Right 2016    Social History   Socioeconomic History  . Marital status: Widowed    Spouse name: Not on file  . Number of children: 2  . Years of education: Not on file  . Highest education level: Not on file  Occupational History  . Not on file  Tobacco Use  . Smoking status: Former Research scientist (life sciences)  . Smokeless tobacco: Never Used  Substance and Sexual Activity  . Alcohol use: No    Alcohol/week: 0.0 standard drinks  . Drug use: No  . Sexual activity: Never  Other Topics Concern  . Not on file  Social History Narrative  . Not on file   Social Determinants of Health   Financial Resource Strain: Low Risk   . Difficulty of Paying Living Expenses: Not hard at all  Food Insecurity: No Food Insecurity  . Worried About Charity fundraiser in the Last Year: Never true  . Ran Out of Food in the Last Year: Never true  Transportation Needs: No Transportation Needs  . Lack of Transportation (Medical): No  . Lack of Transportation (Non-Medical): No  Physical Activity: Insufficiently Active  . Days of Exercise per Week: 7 days  . Minutes of Exercise per  Session: 10 min  Stress: No Stress Concern Present  . Feeling of Stress : Not at all  Social Connections: Unknown  . Frequency of Communication with Friends and Family: Patient refused  . Frequency of Social Gatherings with Friends and Family: Patient refused  . Attends Religious Services: Patient refused  . Active Member of Clubs or Organizations: Patient refused  . Attends Archivist Meetings: Patient refused  . Marital Status: Widowed  Intimate Partner Violence: Not At Risk  . Fear of Current or Ex-Partner: No  . Emotionally Abused: No  . Physically Abused: No  . Sexually Abused: No    Family History  Problem Relation Age of Onset  . Cancer Father   . Heart disease Father   . Diabetes Paternal Grandmother      Current Outpatient Medications:  .  allopurinol (ZYLOPRIM) 100 MG tablet, TAKE 1 TABLET(100 MG) BY MOUTH DAILY, Disp: 90 tablet, Rfl: 1 .  amiodarone (PACERONE) 200 MG tablet, Take 200 mg by mouth  daily., Disp: , Rfl:  .  apixaban (ELIQUIS) 5 MG TABS tablet, Take 1 tablet (5 mg total) by mouth 2 (two) times daily. cardio, Disp: 60 tablet, Rfl: 2 .  atorvastatin (LIPITOR) 80 MG tablet, Take 1 tablet (80 mg total) by mouth daily. cardio, Disp: 30 tablet, Rfl: 2 .  clopidogrel (PLAVIX) 75 MG tablet, One a day, Disp: 90 tablet, Rfl: 1 .  diclofenac sodium (VOLTAREN) 1 % GEL, Apply 4 g topically 4 (four) times daily., Disp: 1 Tube, Rfl: 1 .  ENTRESTO 97-103 MG, Take 1 tablet by mouth 2 (two) times daily., Disp: , Rfl:  .  etodolac (LODINE) 500 MG tablet, Take 500 mg by mouth 2 (two) times daily., Disp: , Rfl:  .  mupirocin ointment (BACTROBAN) 2 %, Apply 1 application topically 2 (two) times daily., Disp: 22 g, Rfl: 0 .  sodium bicarbonate 650 MG tablet, Take 1 tablet by mouth 2 (two) times daily. Kidney specialist, Disp: , Rfl:  .  torsemide (DEMADEX) 20 MG tablet, Take 20 mg by mouth daily., Disp: , Rfl:   Physical exam: There were no vitals filed for this  visit. Limited d/t telephone visit  CMP Latest Ref Rng & Units 10/23/2018  Glucose 65 - 99 mg/dL 92  BUN 8 - 27 mg/dL 74(H)  Creatinine 0.76 - 1.27 mg/dL 3.30(H)  Sodium 134 - 144 mmol/L 139  Potassium 3.5 - 5.2 mmol/L 4.9  Chloride 96 - 106 mmol/L 100  CO2 20 - 29 mmol/L 20  Calcium 8.6 - 10.2 mg/dL 9.8  Total Protein 6.0 - 8.5 g/dL -  Total Bilirubin 0.0 - 1.2 mg/dL -  Alkaline Phos 39 - 117 IU/L -  AST 0 - 40 IU/L -  ALT 0 - 44 IU/L -   CBC Latest Ref Rng & Units 10/13/2018  WBC 4.0 - 10.5 K/uL 7.8  Hemoglobin 13.0 - 17.0 g/dL 10.8(L)  Hematocrit 39.0 - 52.0 % 34.7(L)  Platelets 150 - 400 K/uL 167    No images are attached to the encounter.  CT Chest Wo Contrast  Result Date: 04/04/2019 CLINICAL DATA:  Followup pulmonary nodule. EXAM: CT CHEST WITHOUT CONTRAST TECHNIQUE: Multidetector CT imaging of the chest was performed following the standard protocol without IV contrast. COMPARISON:  CT scan 07/07/2017 FINDINGS: Cardiovascular: The heart is normal in size. No pericardial effusion. Stable tortuosity and calcification of the thoracic aorta. No focal aneurysm. Stable advanced three-vessel coronary artery calcifications. Mediastinum/Nodes: Stable scattered mediastinal and hilar lymph nodes. No mass or overt adenopathy. The esophagus is grossly normal. There is a small hiatal hernia. Lungs/Pleura: Stable emphysematous changes and pulmonary scarring. Stable small calcified granulomas. No worrisome pulmonary lesions or worrisome pulmonary nodules. The 6 mm nodule seen in the left lower lobe on the prior study has resolved. No infiltrates or effusions. The tracheobronchial tree is unremarkable. Stable basilar peripheral interstitial scarring changes. Upper Abdomen: Stable mild cirrhotic changes involving the liver. No worrisome hepatic lesions. Hyperdense/hemorrhagic cyst noted in the upper pole region of the left kidney. Musculoskeletal: No chest wall mass, supraclavicular or axillary  adenopathy. The bony thorax is intact. No worrisome bone lesions. IMPRESSION: 1. Stable emphysematous changes and pulmonary scarring. No acute overlying pulmonary process. 2. Small scattered calcified granulomas. No worrisome pulmonary lesions or pulmonary nodules. 3. Stable scattered mediastinal and hilar lymph nodes but no mass or adenopathy. 4. Stable advanced three-vessel coronary artery calcifications. Aortic Atherosclerosis (ICD10-I70.0) and Emphysema (ICD10-J43.9). Electronically Signed   By: Ricky Stabs.D.  On: 04/04/2019 13:24     Assessment and plan- Patient is a 74 y.o. male who presents to pulmonary nodule clinic for follow-up of incidental lung nodules.  A telephone visit was conducted to review most recent CT scan results.    CT chest without contrast from 04/04/2019 revealed stable emphysema changes and pulmonary scarring, small scattered calcified granulomas with no worrisome pulmonary lesions or pulmonary nodules and stable scattered mediastinal and hilar lymph nodes but no mass or adenopathy.  He has stable advanced three-vessel coronary artery calcifications.   Calculating malignancy probability of a pulmonary nodule: Risk factors include: 1.  Age. 2.  Cancer history. 3.  Diameter of pulmonary nodule and mm 4.  Location 5.  Smoking history 6.  Spiculation present   Based on risk factors, this patient is  low risk for the development of lung cancer.  No additional follow-up needed in our pulmonary nodule clinic.  No worrisome pulmonary lesions or worrisome pulmonary nodules on today's exam.  The 6 mm nodule seen in left lower lobe on prior study has completely resolved.  Patient also has a very remote history of smoking for approximately 2 to 3 years greater than 40 years ago.  He does not meet criteria for our lung screening program.  During our visit, we discussed pulmonary nodules are a common incidental finding and are often how lung cancer is discovered.  Lung cancer  survival is directly related to the stage at diagnosis.  We discussed that nodules can vary in presentation from solitary pulmonary nodules to masses, to groundglass opacities and multiple nodules.  Pulmonary nodules in the majority of cases are benign but the probability of these becoming malignant cannot be undermined.  Early identification of malignant nodules could lead to early diagnosis and increased survival.   We discussed the probability of pulmonary nodules becoming malignant increase with age, pack years of tobacco use, size/characteristics of the nodule and location; with upper lobe involvement being most worrisome.   We discussed the goal of our clinic is to thoroughly evaluate each nodule, developed a comprehensive, individualized plan of care utilizing the most advanced technology and significantly reduce the time from detection to treatment.  A dedicated pulmonary nodule clinic has proven to indeed expedite the detection and treatment of lung cancer.   Patient education in fact sheet provided along with most recent CT scans.  Plan: Review Imaging Review social history. Discuss family/personal history.   Disposition: No follow-up needed in our clinic.  He does not meet criteria for LDCT screening program (Quit date > 40 years ago) Refer back to PCP-Dr. Ronnald Ramp No need for routine imaging unless symptomatic.   Visit Diagnosis 1. Solitary pulmonary nodule     Patient expressed understanding and was in agreement with this plan. He also understands that He can call clinic at any time with any questions, concerns, or complaints.   Greater than 50% was spent in counseling and coordination of care with this patient including but not limited to discussion of the relevant topics above (See A&P) including, but not limited to diagnosis and management of acute and chronic medical conditions.   Thank you for allowing me to participate in the care of this very pleasant patient.     Jacquelin Hawking, NP [facility] Cell - [phone] Pager- [phone] 04/10/2019 11:54 AM

## 2019-04-10 ENCOUNTER — Encounter: Payer: Self-pay | Admitting: *Deleted

## 2019-04-11 ENCOUNTER — Telehealth: Payer: Self-pay

## 2019-04-11 NOTE — Telephone Encounter (Signed)
Called pt due to message we received from GI trying to reach pt for appt. Had to leave a message with Michelle's name and number

## 2019-05-14 DIAGNOSIS — I5022 Chronic systolic (congestive) heart failure: Secondary | ICD-10-CM | POA: Diagnosis not present

## 2019-05-30 DIAGNOSIS — I5022 Chronic systolic (congestive) heart failure: Secondary | ICD-10-CM | POA: Diagnosis not present

## 2019-07-18 DIAGNOSIS — I5022 Chronic systolic (congestive) heart failure: Secondary | ICD-10-CM | POA: Diagnosis not present

## 2019-08-22 ENCOUNTER — Other Ambulatory Visit: Payer: Self-pay

## 2019-08-22 ENCOUNTER — Encounter: Payer: Self-pay | Admitting: Family Medicine

## 2019-08-22 ENCOUNTER — Ambulatory Visit (INDEPENDENT_AMBULATORY_CARE_PROVIDER_SITE_OTHER): Payer: Medicare Other | Admitting: Family Medicine

## 2019-08-22 VITALS — BP 120/78 | HR 60 | Ht 75.0 in | Wt 285.0 lb

## 2019-08-22 DIAGNOSIS — E790 Hyperuricemia without signs of inflammatory arthritis and tophaceous disease: Secondary | ICD-10-CM | POA: Diagnosis not present

## 2019-08-22 DIAGNOSIS — Z1211 Encounter for screening for malignant neoplasm of colon: Secondary | ICD-10-CM | POA: Diagnosis not present

## 2019-08-22 DIAGNOSIS — M25562 Pain in left knee: Secondary | ICD-10-CM | POA: Diagnosis not present

## 2019-08-22 MED ORDER — ALLOPURINOL 100 MG PO TABS: ORAL_TABLET | ORAL | 1 refills | Status: DC

## 2019-08-22 NOTE — Progress Notes (Signed)
Date:  08/22/2019   Name:  Christopher [name]   DOB:  1946-01-21   MRN:  [id number]   Chief Complaint: Gout and Knee Pain (refill diclofenac)  Patient is a 74 year old male who presents for a hyperuricacidemia exam. The patient reports the following problems: left knee pain. Health maintenance has been reviewed   Knee Pain  The incident occurred more than 1 week ago. There was no injury mechanism. The pain is present in the left knee. The quality of the pain is described as aching. The pain is at a severity of 5/10. The pain is moderate. The pain has been improving since onset. Pertinent negatives include no inability to bear weight, loss of motion, loss of sensation, muscle weakness, numbness or tingling. The symptoms are aggravated by movement. He has tried NSAIDs for the symptoms. The treatment provided mild relief.    Lab Results  Component Value Date   CREATININE 3.30 (H) 10/23/2018   BUN 74 (H) 10/23/2018   NA 139 10/23/2018   K 4.9 10/23/2018   CL 100 10/23/2018   CO2 20 10/23/2018   Lab Results  Component Value Date   CHOL 104 01/24/2018   HDL 21 (L) 01/24/2018   LDLCALC 61 01/24/2018   TRIG 112 01/24/2018   CHOLHDL 5.0 01/24/2018   Lab Results  Component Value Date   TSH 3.423 06/12/2014   No results found for: HGBA1C Lab Results  Component Value Date   WBC 7.8 10/13/2018   HGB 10.8 (L) 10/13/2018   HCT 34.7 (L) 10/13/2018   MCV 94.6 10/13/2018   PLT 167 10/13/2018   Lab Results  Component Value Date   ALT 18 12/16/2016   AST 18 12/16/2016   ALKPHOS 96 12/16/2016   BILITOT 0.5 12/16/2016     Review of Systems  Constitutional: Negative for chills and fever.  HENT: Negative for drooling, ear discharge, ear pain and sore throat.   Respiratory: Negative for cough, shortness of breath and wheezing.   Cardiovascular: Negative for chest pain, palpitations and leg swelling.  Gastrointestinal: Negative for abdominal pain, blood in stool, constipation,  diarrhea and nausea.  Endocrine: Negative for polydipsia.  Genitourinary: Negative for dysuria, frequency, hematuria and urgency.  Musculoskeletal: Negative for back pain, myalgias and neck pain.  Skin: Negative for rash.  Allergic/Immunologic: Negative for environmental allergies.  Neurological: Negative for dizziness, tingling, numbness and headaches.  Hematological: Does not bruise/bleed easily.  Psychiatric/Behavioral: Negative for suicidal ideas. The patient is not nervous/anxious.     Patient Active Problem List   Diagnosis Date Noted  . Ischemic cardiomyopathy 12/19/2018  . Acute renal failure superimposed on stage 3 chronic kidney disease ([place]) 10/14/2018  . Atrial flutter ([place]) 10/14/2018  . Chronic systolic heart failure ([place]) 10/14/2018  . History of TIA (transient ischemic attack) 10/14/2018  . Volume overload 10/14/2018  . Cerebral vascular disease 09/08/2018  . TIA (transient ischemic attack) 12/12/2017  . 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 Encounter for general adult medical examination without abnormal findings 10/22/2014  . Disorder of kidney and ureter 10/22/2014  . Renal colic 40/98/1191  . 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 Creatinine elevation 10/22/2014  . Essential (primary) hypertension 10/22/2014  . H/O hypercholesterolemia 10/22/2014  . Dysmetabolic syndrome 47/82/9562  . H/O acute myocardial infarction 10/22/2014  . Adiposity 10/22/2014  . Impaired renal function 10/22/2014  . Need for vaccination 10/22/2014  . 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 S/P coronary artery  stent placement 04/09/2014  . Primary osteoarthritis of right knee 03/26/2014  . Chronic kidney disease, stage [name] (moderate) 01/25/2011    No Known Allergies  Past Surgical History:  Procedure Laterality Date  . COLONOSCOPY  2011  . KNEE ARTHROSCOPY Right 2016    Social History   Tobacco Use  . Smoking status: Former  Research scientist (life sciences)  . Smokeless tobacco: Never Used  Substance Use Topics  . Alcohol use: No    Alcohol/week: 0.0 standard drinks  . Drug use: No     Medication list has been reviewed and updated.  Current Meds  Medication Sig  . allopurinol (ZYLOPRIM) 100 MG tablet TAKE 1 TABLET(100 MG) BY MOUTH DAILY  . amiodarone (PACERONE) 200 MG tablet Take 200 mg by mouth daily.  Marland Kitchen apixaban (ELIQUIS) 5 MG TABS tablet Take 1 tablet (5 mg total) by mouth 2 (two) times daily. cardio  . atorvastatin (LIPITOR) 80 MG tablet Take 1 tablet (80 mg total) by mouth daily. cardio  . dapagliflozin propanediol (FARXIGA) 10 MG TABS tablet Take 1 tablet by mouth daily. [facility]  . diclofenac sodium (VOLTAREN) 1 % GEL Apply 4 g topically 4 (four) times daily.  Marland Kitchen ENTRESTO 97-103 MG Take 1 tablet by mouth 2 (two) times daily.  . metoprolol (TOPROL-XL) 200 MG 24 hr tablet Take 1 tablet by mouth daily. cardio  . mupirocin ointment (BACTROBAN) 2 % Apply 1 application topically 2 (two) times daily.  Marland Kitchen spironolactone (ALDACTONE) 25 MG tablet Take 1 tablet by mouth daily. cardio  . torsemide (DEMADEX) 20 MG tablet Take 20 mg by mouth daily.    PHQ 2/9 Scores 08/22/2019 04/02/2019 03/13/2019 09/08/2018  PHQ - 2 Score 0 0 0 0  PHQ- 9 Score 0 - 0 0    BP Readings from Last 3 Encounters:  08/22/19 120/78  04/02/19 124/79  03/13/19 120/62    Physical Exam Vitals and nursing note reviewed.  HENT:     Head: Normocephalic.     Right Ear: Tympanic membrane, ear canal and external ear normal.     Left Ear: Tympanic membrane, ear canal and external ear normal.     Nose: Nose normal.  Eyes:     General: No scleral icterus.       Right eye: No discharge.        Left eye: No discharge.     Conjunctiva/sclera: Conjunctivae normal.     Pupils: Pupils are equal, round, and reactive to light.  Neck:     Thyroid: No thyromegaly.     Vascular: No JVD.     Trachea: No tracheal deviation.  Cardiovascular:     Rate and Rhythm: Normal rate  and regular rhythm.     Heart sounds: Normal heart sounds. No murmur. No friction rub. No gallop.   Pulmonary:     Effort: No respiratory distress.     Breath sounds: Normal breath sounds. No wheezing, rhonchi or rales.  Abdominal:     General: Bowel sounds are normal.     Palpations: Abdomen is soft. There is no mass.     Tenderness: There is no abdominal tenderness. There is no guarding or rebound.  Musculoskeletal:        General: No tenderness. Normal range of motion.     Cervical back: Normal range of motion and neck supple.  Lymphadenopathy:     Cervical: No cervical adenopathy.  Skin:    General: Skin is warm.     Findings: No rash.  Neurological:  Mental Status: He is alert and oriented to person, place, and time.     Cranial Nerves: No cranial nerve deficit.     Deep Tendon Reflexes: Reflexes are normal and symmetric.     Wt Readings from Last 3 Encounters:  08/22/19 285 lb (129.3 kg)  04/02/19 276 lb (125.2 kg)  03/13/19 279 lb (126.6 kg)    BP 120/78   Pulse 60   Ht 6\' 3"  (1.905 m)   Wt 285 lb (129.3 kg)   BMI 35.62 kg/m   Assessment and Plan: 1. Hyperuricemia Chronic.  Controlled.  Stable.  Continue allopurinol 100 mg daily. - allopurinol (ZYLOPRIM) 100 MG tablet; TAKE 1 TABLET(100 MG) BY MOUTH DAILY  Dispense: 90 tablet; Refill: 1  2. Arthralgia of left knee Chronic.  Uncontrolled.  Unstable.  Patient has had surgery on his right knee and has approached the states that he is needs to be considered for surgery on his left knee.  Patient will contact Dr. Marry Guan concerning this.  In the meantime patient will continue Voltaren gel to the area given that he is on Eliquis.  Patient will be offered to provide a referral to Dr. Marry Guan.  3. Colon cancer screening Gust with patient and he has agreed to do the fecal occult immunochemical test. - Fecal occult blood, imunochemical

## 2019-08-27 DIAGNOSIS — Z1211 Encounter for screening for malignant neoplasm of colon: Secondary | ICD-10-CM | POA: Diagnosis not present

## 2019-08-28 ENCOUNTER — Telehealth: Payer: Self-pay | Admitting: Family Medicine

## 2019-08-28 LAB — FECAL OCCULT BLOOD, IMMUNOCHEMICAL: Fecal Occult Bld: POSITIVE — AB

## 2019-08-28 NOTE — Telephone Encounter (Signed)
Copied from [place] [id number]. Topic: General - Call Back - No Documentation >> Aug 28, 2019  2:52 PM Sheran Luz wrote: Patient states he is returning call to [place].

## 2019-08-28 NOTE — Telephone Encounter (Signed)
Called and left message for pt to let us know if he is going to turn in his FIT test. Needs to be done this week

## 2019-08-30 ENCOUNTER — Ambulatory Visit: Payer: Medicare Other | Admitting: Family Medicine

## 2019-09-03 ENCOUNTER — Telehealth: Payer: Self-pay | Admitting: Family Medicine

## 2019-09-03 NOTE — Telephone Encounter (Signed)
Copied from [place] [id number]. Topic: Quick Communication - Lab Results (Clinic Use ONLY) >> Aug 30, 2019 11:14 AM Fredderick Severance wrote: Called patient to inform them of positive FIT test lab results. When patient returns call, triage nurse may disclose results and find out what GI he wants to go to. [facility] or Dr Allen Norris >> Sep 03, 2019  8:58 AM Celene Kras wrote: Pt called regarding his colonoscopy referral. Pt states that he does not have a preference. Please advise.

## 2019-09-03 NOTE — Telephone Encounter (Signed)
Will send to [facility]

## 2019-09-05 ENCOUNTER — Other Ambulatory Visit: Payer: Self-pay

## 2019-09-05 ENCOUNTER — Encounter: Payer: Self-pay | Admitting: Gastroenterology

## 2019-09-05 ENCOUNTER — Telehealth (INDEPENDENT_AMBULATORY_CARE_PROVIDER_SITE_OTHER): Payer: Self-pay | Admitting: Gastroenterology

## 2019-09-05 DIAGNOSIS — Z1211 Encounter for screening for malignant neoplasm of colon: Secondary | ICD-10-CM

## 2019-09-05 DIAGNOSIS — Z800 Family history of malignant neoplasm of digestive organs: Secondary | ICD-10-CM

## 2019-09-05 NOTE — Progress Notes (Signed)
Gastroenterology Pre-Procedure Review  Request Date: Tuesday 09/25/19 Requesting Physician: Dr. Bonna Gains  PATIENT REVIEW QUESTIONS: The patient responded to the following health history questions as indicated:    1. Are you having any GI issues? no 2. Do you have a personal history of Polyps? no 3. Do you have a family history of Colon Cancer or Polyps? yes (father colon cancer) 4. Diabetes Mellitus? no 5. Joint replacements in the past 12 months?no 6. Major health problems in the past 3 months?no 7. Any artificial heart valves, MVP, or defibrillator?no    MEDICATIONS & ALLERGIES:    Patient reports the following regarding taking any anticoagulation/antiplatelet therapy:   Plavix, Coumadin, Eliquis, Xarelto, Lovenox, Pradaxa, Brilinta, or Effient? yes (Eliquis-Blood thinner request to be sent to Dr. Sharyn Blitz at [facility]ologist) Aspirin? no  Patient confirms/reports the following medications:  Current Outpatient Medications  Medication Sig Dispense Refill  . allopurinol (ZYLOPRIM) 100 MG tablet TAKE 1 TABLET(100 MG) BY MOUTH DAILY 90 tablet 1  . amiodarone (PACERONE) 200 MG tablet Take 200 mg by mouth daily.    Marland Kitchen apixaban (ELIQUIS) 5 MG TABS tablet Take 1 tablet (5 mg total) by mouth 2 (two) times daily. cardio 60 tablet 2  . atorvastatin (LIPITOR) 80 MG tablet Take 1 tablet (80 mg total) by mouth daily. cardio 30 tablet 2  . dapagliflozin propanediol (FARXIGA) 10 MG TABS tablet Take 1 tablet by mouth daily. [facility]    . diclofenac sodium (VOLTAREN) 1 % GEL Apply 4 g topically 4 (four) times daily. 1 Tube 1  . ENTRESTO 97-103 MG Take 1 tablet by mouth 2 (two) times daily.    . metoprolol (TOPROL-XL) 200 MG 24 hr tablet Take 1 tablet by mouth daily. cardio    . mupirocin ointment (BACTROBAN) 2 % Apply 1 application topically 2 (two) times daily. 22 g 0  . spironolactone (ALDACTONE) 25 MG tablet Take 1 tablet by mouth daily. cardio    . torsemide (DEMADEX) 20 MG tablet Take 20 mg by  mouth daily.     No current facility-administered medications for this visit.    Patient confirms/reports the following allergies:  No Known Allergies  Orders Placed This Encounter  Procedures  . Procedural/ Surgical Case Request: COLONOSCOPY WITH PROPOFOL    Standing Status:   Standing    Number of Occurrences:   1    Order Specific Question:   Pre-op diagnosis    Answer:   screening colonoscopy, father colon cancer    Order Specific Question:   CPT Code    Answer:   [phone]    AUTHORIZATION INFORMATION Primary Insurance: 1D#: Group #:  Secondary Insurance: 1D#: Group #:  SCHEDULE INFORMATION: Date: Tuesday 09/25/19 Time: Location:[facility]

## 2019-09-11 ENCOUNTER — Telehealth: Payer: Self-pay

## 2019-09-11 NOTE — Telephone Encounter (Signed)
Blood thinner advice received from Dr. Molli Barrows CiCi's office advising patient to stop Eliquis 2-3 days prior to having his colonoscopy on 09/25/19.  Patient has been advised of this.  Thanks,  [place], [place]

## 2019-09-19 ENCOUNTER — Other Ambulatory Visit: Payer: Self-pay

## 2019-09-19 ENCOUNTER — Encounter: Payer: Self-pay | Admitting: Gastroenterology

## 2019-09-21 ENCOUNTER — Other Ambulatory Visit: Payer: Self-pay

## 2019-09-21 ENCOUNTER — Other Ambulatory Visit
Admission: RE | Admit: 2019-09-21 | Discharge: 2019-09-21 | Disposition: A | Discharge status: Home / Self Care | Payer: Medicare Other | Source: Ambulatory Visit | Attending: Gastroenterology | Admitting: Gastroenterology

## 2019-09-21 DIAGNOSIS — Z01812 Encounter for preprocedural laboratory examination: Secondary | ICD-10-CM | POA: Diagnosis not present

## 2019-09-21 DIAGNOSIS — Z20822 Contact with and (suspected) exposure to covid-19: Secondary | ICD-10-CM | POA: Insufficient documentation

## 2019-09-22 LAB — SARS CORONAVIRUS 2 (TAT 6-24 HRS): SARS Coronavirus 2: NEGATIVE

## 2019-09-24 NOTE — Discharge Instructions (Signed)
General Anesthesia, Adult, Care After This sheet gives you information about how to care for yourself after your procedure. Your health care provider may also give you more specific instructions. If you have problems or questions, contact your health care provider. What can I expect after the procedure? After the procedure, the following side effects are common:  Pain or discomfort at the IV site.  Nausea.  Vomiting.  Sore throat.  Trouble concentrating.  Feeling cold or chills.  Weak or tired.  Sleepiness and fatigue.  Soreness and body aches. These side effects can affect parts of the body that were not involved in surgery. Follow these instructions at home:  For at least 24 hours after the procedure:  Have a responsible adult stay with you. It is important to have someone help care for you until you are awake and alert.  Rest as needed.  Do not: ? Participate in activities in which you could fall or become injured. ? Drive. ? Use heavy machinery. ? Drink alcohol. ? Take sleeping pills or medicines that cause drowsiness. ? Make important decisions or sign legal documents. ? Take care of children on your own. Eating and drinking  Follow any instructions from your health care provider about eating or drinking restrictions.  When you feel hungry, start by eating small amounts of foods that are soft and easy to digest (bland), such as toast. Gradually return to your regular diet.  Drink enough fluid to keep your urine pale yellow.  If you vomit, rehydrate by drinking water, juice, or clear broth. General instructions  If you have sleep apnea, surgery and certain medicines can increase your risk for breathing problems. Follow instructions from your health care provider about wearing your sleep device: ? Anytime you are sleeping, including during daytime naps. ? While taking prescription pain medicines, sleeping medicines, or medicines that make you drowsy.  Return to  your normal activities as told by your health care provider. Ask your health care provider what activities are safe for you.  Take over-the-counter and prescription medicines only as told by your health care provider.  If you smoke, do not smoke without supervision.  Keep all follow-up visits as told by your health care provider. This is important. Contact a health care provider if:  You have nausea or vomiting that does not get better with medicine.  You cannot eat or drink without vomiting.  You have pain that does not get better with medicine.  You are unable to pass urine.  You develop a skin rash.  You have a fever.  You have redness around your IV site that gets worse. Get help right away if:  You have difficulty breathing.  You have chest pain.  You have blood in your urine or stool, or you vomit blood. Summary  After the procedure, it is common to have a sore throat or nausea. It is also common to feel tired.  Have a responsible adult stay with you for the first 24 hours after general anesthesia. It is important to have someone help care for you until you are awake and alert.  When you feel hungry, start by eating small amounts of foods that are soft and easy to digest (bland), such as toast. Gradually return to your regular diet.  Drink enough fluid to keep your urine pale yellow.  Return to your normal activities as told by your health care provider. Ask your health care provider what activities are safe for you. This information is not   intended to replace advice given to you by your health care provider. Make sure you discuss any questions you have with your health care provider. Document Revised: 03/04/2017 Document Reviewed: 10/15/2016 Elsevier Patient Education  2020 Elsevier Inc.  

## 2019-09-25 ENCOUNTER — Ambulatory Visit
Admission: RE | Admit: 2019-09-25 | Discharge: 2019-09-25 | Disposition: A | Discharge status: Home / Self Care | Payer: Medicare Other | Attending: Gastroenterology | Admitting: Gastroenterology

## 2019-09-25 ENCOUNTER — Other Ambulatory Visit: Payer: Self-pay | Admitting: Gastroenterology

## 2019-09-25 ENCOUNTER — Ambulatory Visit: Payer: Medicare Other | Admitting: Anesthesiology

## 2019-09-25 ENCOUNTER — Encounter: Payer: Self-pay | Admitting: Gastroenterology

## 2019-09-25 ENCOUNTER — Other Ambulatory Visit: Payer: Self-pay

## 2019-09-25 ENCOUNTER — Encounter
Admission: RE | Disposition: A | Discharge status: Home / Self Care | Payer: Self-pay | Source: Home / Self Care | Attending: Gastroenterology

## 2019-09-25 DIAGNOSIS — Z7901 Long term (current) use of anticoagulants: Secondary | ICD-10-CM | POA: Insufficient documentation

## 2019-09-25 DIAGNOSIS — N183 Chronic kidney disease, stage 3 unspecified: Secondary | ICD-10-CM | POA: Insufficient documentation

## 2019-09-25 DIAGNOSIS — D124 Benign neoplasm of descending colon: Secondary | ICD-10-CM | POA: Insufficient documentation

## 2019-09-25 DIAGNOSIS — Z8673 Personal history of transient ischemic attack (TIA), and cerebral infarction without residual deficits: Secondary | ICD-10-CM | POA: Diagnosis not present

## 2019-09-25 DIAGNOSIS — Z800 Family history of malignant neoplasm of digestive organs: Secondary | ICD-10-CM

## 2019-09-25 DIAGNOSIS — K648 Other hemorrhoids: Secondary | ICD-10-CM | POA: Diagnosis not present

## 2019-09-25 DIAGNOSIS — Z1211 Encounter for screening for malignant neoplasm of colon: Secondary | ICD-10-CM | POA: Insufficient documentation

## 2019-09-25 DIAGNOSIS — I252 Old myocardial infarction: Secondary | ICD-10-CM | POA: Diagnosis not present

## 2019-09-25 DIAGNOSIS — M1990 Unspecified osteoarthritis, unspecified site: Secondary | ICD-10-CM | POA: Insufficient documentation

## 2019-09-25 DIAGNOSIS — K635 Polyp of colon: Secondary | ICD-10-CM

## 2019-09-25 DIAGNOSIS — E785 Hyperlipidemia, unspecified: Secondary | ICD-10-CM | POA: Diagnosis not present

## 2019-09-25 DIAGNOSIS — I2510 Atherosclerotic heart disease of native coronary artery without angina pectoris: Secondary | ICD-10-CM | POA: Diagnosis not present

## 2019-09-25 DIAGNOSIS — Z87891 Personal history of nicotine dependence: Secondary | ICD-10-CM | POA: Insufficient documentation

## 2019-09-25 DIAGNOSIS — I509 Heart failure, unspecified: Secondary | ICD-10-CM | POA: Diagnosis not present

## 2019-09-25 DIAGNOSIS — D125 Benign neoplasm of sigmoid colon: Secondary | ICD-10-CM | POA: Insufficient documentation

## 2019-09-25 DIAGNOSIS — Z7984 Long term (current) use of oral hypoglycemic drugs: Secondary | ICD-10-CM | POA: Diagnosis not present

## 2019-09-25 DIAGNOSIS — D122 Benign neoplasm of ascending colon: Secondary | ICD-10-CM | POA: Diagnosis not present

## 2019-09-25 DIAGNOSIS — M109 Gout, unspecified: Secondary | ICD-10-CM | POA: Insufficient documentation

## 2019-09-25 DIAGNOSIS — Z79899 Other long term (current) drug therapy: Secondary | ICD-10-CM | POA: Insufficient documentation

## 2019-09-25 DIAGNOSIS — K5730 Diverticulosis of large intestine without perforation or abscess without bleeding: Secondary | ICD-10-CM | POA: Insufficient documentation

## 2019-09-25 DIAGNOSIS — I130 Hypertensive heart and chronic kidney disease with heart failure and stage 1 through stage 4 chronic kidney disease, or unspecified chronic kidney disease: Secondary | ICD-10-CM | POA: Diagnosis not present

## 2019-09-25 HISTORY — PX: POLYPECTOMY: SHX5525

## 2019-09-25 HISTORY — DX: Gout, unspecified: M10.9

## 2019-09-25 HISTORY — PX: COLONOSCOPY WITH PROPOFOL: SHX5780

## 2019-09-25 HISTORY — DX: Presence of dental prosthetic device (complete) (partial): Z97.2

## 2019-09-25 HISTORY — DX: Chronic kidney disease, stage 3 unspecified: N18.30

## 2019-09-25 HISTORY — DX: Atherosclerotic heart disease of native coronary artery without angina pectoris: I25.10

## 2019-09-25 HISTORY — DX: Unspecified osteoarthritis, unspecified site: M19.90

## 2019-09-25 HISTORY — DX: Heart failure, unspecified: I50.9

## 2019-09-25 LAB — HM COLONOSCOPY

## 2019-09-25 SURGERY — COLONOSCOPY WITH PROPOFOL
Anesthesia: General | Site: Rectum

## 2019-09-25 MED ORDER — ONDANSETRON HCL 4 MG/2ML IJ SOLN
INTRAMUSCULAR | Status: DC | PRN
Start: 2019-09-25 — End: 2019-09-25
  Administered 2019-09-25: 4 mg via INTRAVENOUS

## 2019-09-25 MED ORDER — STERILE WATER FOR IRRIGATION IR SOLN
Status: DC | PRN
  Administered 2019-09-25: 150 mL

## 2019-09-25 MED ORDER — LIDOCAINE HCL (CARDIAC) PF 100 MG/5ML IV SOSY
PREFILLED_SYRINGE | INTRAVENOUS | Status: DC | PRN
  Administered 2019-09-25: 40 mg via INTRAVENOUS

## 2019-09-25 MED ORDER — LACTATED RINGERS IV SOLN: INTRAVENOUS | Status: DC

## 2019-09-25 MED ORDER — PROPOFOL 10 MG/ML IV BOLUS
INTRAVENOUS | Status: DC | PRN
  Administered 2019-09-25: 20 mg via INTRAVENOUS
  Administered 2019-09-25: 30 mg via INTRAVENOUS
  Administered 2019-09-25 (×7): 20 mg via INTRAVENOUS
  Administered 2019-09-25: 70 mg via INTRAVENOUS
  Administered 2019-09-25 (×2): 20 mg via INTRAVENOUS

## 2019-09-25 SURGICAL SUPPLY — 25 items
CLIP HMST 235XBRD CATH ROT (MISCELLANEOUS) IMPLANT
CLIP RESOLUTION 360 11X235 (MISCELLANEOUS)
ELECT REM PT RETURN 9FT ADLT (ELECTROSURGICAL)
ELECTRODE REM PT RTRN 9FT ADLT (ELECTROSURGICAL) IMPLANT
FCP ESCP3.2XJMB 240X2.8X (MISCELLANEOUS)
FORCEPS BIOP RAD 4 LRG CAP 4 (CUTTING FORCEPS) IMPLANT
FORCEPS BIOP RJ4 240 W/NDL (MISCELLANEOUS)
FORCEPS ESCP3.2XJMB 240X2.8X (MISCELLANEOUS) IMPLANT
GOWN CVR UNV OPN BCK APRN NK (MISCELLANEOUS) ×4 IMPLANT
GOWN ISOL THUMB LOOP REG UNIV (MISCELLANEOUS) ×4
INJECTOR VARIJECT VIN23 (MISCELLANEOUS) IMPLANT
KIT DEFENDO VALVE AND CONN (KITS) IMPLANT
KIT ENDO PROCEDURE OLY (KITS) ×4 IMPLANT
MANIFOLD NEPTUNE II (INSTRUMENTS) ×4 IMPLANT
MARKER SPOT ENDO TATTOO 5ML (MISCELLANEOUS) IMPLANT
PROBE APC STR FIRE (PROBE) IMPLANT
RETRIEVER NET ROTH 2.5X230 LF (MISCELLANEOUS) IMPLANT
SNARE COLD EXACTO (MISCELLANEOUS) ×4 IMPLANT
SNARE SHORT THROW 13M SML OVAL (MISCELLANEOUS) IMPLANT
SNARE SHORT THROW 30M LRG OVAL (MISCELLANEOUS) IMPLANT
SNARE SNG USE RND 15MM (INSTRUMENTS) IMPLANT
SPOT EX ENDOSCOPIC TATTOO (MISCELLANEOUS)
TRAP ETRAP POLY (MISCELLANEOUS) ×4 IMPLANT
VARIJECT INJECTOR VIN23 (MISCELLANEOUS)
WATER STERILE IRR 250ML POUR (IV SOLUTION) ×4 IMPLANT

## 2019-09-25 NOTE — Anesthesia Postprocedure Evaluation (Signed)
Anesthesia Post Note  Patient: Christopher [name]  Procedure(s) Performed: COLONOSCOPY WITH PROPOFOL (N/A ) POLYPECTOMY (N/A Rectum)     Patient location during evaluation: PACU Anesthesia Type: General Level of consciousness: awake and alert Pain management: pain level controlled Vital Signs Assessment: post-procedure vital signs reviewed and stable Respiratory status: spontaneous breathing, nonlabored ventilation, respiratory function stable and patient connected to nasal cannula oxygen Cardiovascular status: blood pressure returned to baseline and stable Postop Assessment: no apparent nausea or vomiting Anesthetic complications: no   No complications documented.  Alisa Graff

## 2019-09-25 NOTE — Anesthesia Preprocedure Evaluation (Signed)
Anesthesia Evaluation  Patient identified by MRN, date of birth, ID band Patient awake    Reviewed: Allergy & Precautions, H&P , NPO status , Patient's Chart, lab work & pertinent test results, reviewed documented beta blocker date and time   Airway Mallampati: II  TM Distance: >3 FB Neck ROM: full    Dental  (+) Upper Dentures   Pulmonary neg pulmonary ROS, former smoker,    Pulmonary exam normal breath sounds clear to auscultation       Cardiovascular Exercise Tolerance: Good hypertension, + CAD and +CHF   Rhythm:regular Rate:Normal     Neuro/Psych TIAnegative psych ROS   GI/Hepatic negative GI ROS, Neg liver ROS,   Endo/Other  negative endocrine ROS  Renal/GU CRF  negative genitourinary   Musculoskeletal  (+) Arthritis ,   Abdominal   Peds  Hematology negative hematology ROS (+)   Anesthesia Other Findings   Reproductive/Obstetrics negative OB ROS                             Anesthesia Physical Anesthesia Plan  ASA: III  Anesthesia Plan: General   Post-op Pain Management:    Induction:   PONV Risk Score and Plan: 2 and Propofol infusion and TIVA  Airway Management Planned:   Additional Equipment:   Intra-op Plan:   Post-operative Plan:   Informed Consent: I have reviewed the patients History and Physical, chart, labs and discussed the procedure including the risks, benefits and alternatives for the proposed anesthesia with the patient or authorized representative who has indicated his/her understanding and acceptance.     Dental Advisory Given  Plan Discussed with: CRNA  Anesthesia Plan Comments:         Anesthesia Quick Evaluation

## 2019-09-25 NOTE — Op Note (Signed)
[facility] Gastroenterology Patient Name: Christopher [name] Procedure Date: 09/25/2019 10:33 AM MRN: [id number] Account #: [id number] Date of Birth: 30-Oct-1945 Admit Type: Outpatient Age: 74 Room: [facility] OR ROOM 01 Gender: Male Note Status: Finalized Procedure:             Colonoscopy Indications:           Screening in patient at increased risk: Family history                         of 1st-degree relative with colorectal cancer Providers:             Jaystin Mcgarvey B. Bonna Gains MD, MD Referring MD:          Juline Patch, MD (Referring MD) Medicines:             Monitored Anesthesia Care Complications:         No immediate complications. Procedure:             Pre-Anesthesia Assessment:                        - ASA Grade Assessment: II - A patient with mild                         systemic disease.                        - Prior to the procedure, a History and Physical was                         performed, and patient medications, allergies and                         sensitivities were reviewed. The patient's tolerance                         of previous anesthesia was reviewed.                        - The risks and benefits of the procedure and the                         sedation options and risks were discussed with the                         patient. All questions were answered and informed                         consent was obtained.                        - Patient identification and proposed procedure were                         verified prior to the procedure by the physician, the                         nurse, the anesthesiologist, the anesthetist and the  technician. The procedure was verified in the                         procedure room.                        After obtaining informed consent, the colonoscope was                         passed under direct vision. Throughout the procedure,                         the patient's  blood pressure, pulse, and oxygen                         saturations were monitored continuously. The was                         introduced through the anus and advanced to the the                         cecum, identified by appendiceal orifice and ileocecal                         valve. The colonoscopy was performed with ease. The                         patient tolerated the procedure well. The quality of                         the bowel preparation was fair except the ascending                         colon was poor. Findings:      The perianal and digital rectal examinations were normal.      Three sessile polyps were found in the sigmoid colon, descending colon       and ascending colon. The polyps were 5 to 7 mm in size. These polyps       were removed with a cold snare. Resection and retrieval were complete.      Multiple diverticula were found in the sigmoid colon.      The exam was otherwise without abnormality.      Non-bleeding internal hemorrhoids were found during retroflexion. Impression:            - Three 5 to 7 mm polyps in the sigmoid colon, in the                         descending colon and in the ascending colon, removed                         with a cold snare. Resected and retrieved.                        - Diverticulosis in the sigmoid colon.                        - The examination was otherwise normal.                        -  Non-bleeding internal hemorrhoids. Recommendation:        - Discharge patient to home (with escort).                        - High fiber diet.                        - Advance diet as tolerated.                        - Continue present medications.                        - Await pathology results.                        - Repeat colonoscopy within 6 months with 2 day prep.                        - The findings and recommendations were discussed with                         the patient.                        - The findings and  recommendations were discussed with                         the patient's family.                        - Return to primary care physician as previously                         scheduled. Procedure Code(s):     --- Professional ---                        [phone], Colonoscopy, flexible; with removal of                         tumor(s), polyp(s), or other lesion(s) by snare                         technique Diagnosis Code(s):     --- Professional ---                        Z12.11, Encounter for screening for malignant neoplasm                         of colon                        K63.5, Polyp of colon CPT copyright 2019 American Medical Association. All rights reserved. The codes documented in this report are preliminary and upon coder review may  be revised to meet current compliance requirements.  Vonda Antigua, MD Margretta Sidle B. Bonna Gains MD, MD 09/25/2019 11:34:10 AM This report has been signed electronically. Number of Addenda: 0 Note Initiated On: 09/25/2019 10:33 AM Scope Withdrawal Time: 0 hours 11 minutes 27 seconds  Total Procedure Duration: 0 hours 20 minutes 16 seconds  Estimated Blood Loss:  Estimated blood loss: none.      [facility]

## 2019-09-25 NOTE — H&P (Addendum)
Vonda Antigua, MD [address], [place], [place], [place], [address] [address], [place], [place], [place], [address] Phone: [phone]  Fax: [phone]  Primary Care Physician:  Juline Patch, MD   Pre-Procedure History & Physical: HPI:  Christopher [name] is a 74 y.o. male is here for a colonoscopy.   Past Medical History:  Diagnosis Date  . Arthritis    ankles, hands, left knee  . CHF (congestive heart failure) ([facility])   . Chronic kidney disease (CKD), stage [name] (moderate)   . Coronary artery disease   . Gout   . Hyperlipidemia   . Hypertension   . Myocardial infarction ([place]) 2000  . TIA (transient ischemic attack) 2003  . Wears dentures    full upper    Past Surgical History:  Procedure Laterality Date  . CARDIAC CATHETERIZATION  2000   stent   . COLONOSCOPY  2011  . JOINT REPLACEMENT Right    knee  . KNEE ARTHROSCOPY Right 2016    Prior to Admission medications   Medication Sig Start Date End Date Taking? Authorizing Provider  allopurinol (ZYLOPRIM) 100 MG tablet TAKE 1 TABLET(100 MG) BY MOUTH DAILY 08/22/19  Yes Juline Patch, MD  amiodarone (PACERONE) 200 MG tablet Take 200 mg by mouth daily. 02/16/19  Yes [provider]  apixaban (ELIQUIS) 5 MG TABS tablet Take 1 tablet (5 mg total) by mouth 2 (two) times daily. cardio 11/16/18 09/19/19 Yes Juline Patch, MD  atorvastatin (LIPITOR) 80 MG tablet Take 1 tablet (80 mg total) by mouth daily. cardio 11/16/18 09/19/19 Yes Juline Patch, MD  dapagliflozin propanediol (FARXIGA) 10 MG TABS tablet Take 1 tablet by mouth daily. [facility] 07/04/19  Yes [provider]  diclofenac sodium (VOLTAREN) 1 % GEL Apply 4 g topically 4 (four) times daily. 10/24/14  Yes Juline Patch, MD  ENTRESTO 97-103 MG Take 1 tablet by mouth 2 (two) times daily. 03/02/19  Yes [provider]  metoprolol (TOPROL-XL) 200 MG 24 hr tablet Take 1 tablet by mouth daily. cardio 07/27/19  Yes [provider]    mupirocin ointment (BACTROBAN) 2 % Apply 1 application topically 2 (two) times daily. 03/13/19  Yes Juline Patch, MD  SODIUM BICARBONATE PO Take by mouth 2 (two) times daily.   Yes [provider]  spironolactone (ALDACTONE) 25 MG tablet Take 1 tablet by mouth daily. cardio 07/09/19  Yes [provider]  torsemide (DEMADEX) 20 MG tablet Take 20 mg by mouth daily. 03/06/19   [provider]    Allergies as of 09/05/2019  . (No Known Allergies)    Family History  Problem Relation Age of Onset  . Cancer Father   . Heart disease Father   . Diabetes Paternal Grandmother     Social History   Socioeconomic History  . Marital status: Widowed    Spouse name: Not on file  . Number of children: 2  . Years of education: Not on file  . Highest education level: Not on file  Occupational History  . Not on file  Tobacco Use  . Smoking status: Former Smoker    Types: Cigarettes    Quit date: 1982    Years since quitting: 39.5  . Smokeless tobacco: Never Used  Vaping Use  . Vaping Use: Never used  Substance and Sexual Activity  . Alcohol use: No    Alcohol/week: 0.0 standard drinks  . Drug use: No  . Sexual activity: Never  Other Topics Concern  . Not on file  Social History Narrative  . Not on file   Social Determinants of Health   Financial Resource Strain: Low Risk   . Difficulty of Paying Living Expenses: Not hard at all  Food Insecurity: No Food Insecurity  . Worried About Charity fundraiser in the Last Year: Never true  . Ran Out of Food in the Last Year: Never true  Transportation Needs: No Transportation Needs  . Lack of Transportation (Medical): No  . Lack of Transportation (Non-Medical): No  Physical Activity: Insufficiently Active  . Days of Exercise per Week: 7 days  . Minutes of Exercise per Session: 10 min  Stress: No Stress Concern Present  . Feeling of Stress : Not at all  Social Connections: Unknown  . Frequency of  Communication with Friends and Family: Patient refused  . Frequency of Social Gatherings with Friends and Family: Patient refused  . Attends Religious Services: Patient refused  . Active Member of Clubs or Organizations: Patient refused  . Attends Archivist Meetings: Patient refused  . Marital Status: Widowed  Intimate Partner Violence: Not At Risk  . Fear of Current or Ex-Partner: No  . Emotionally Abused: No  . Physically Abused: No  . Sexually Abused: No    Review of Systems: See HPI, otherwise negative ROS  Physical Exam: BP (!) 154/86   Pulse 62   Temp 97.9 F (36.6 C) (Temporal)   Ht 6\' 3"  (1.905 m)   Wt 126.1 kg   SpO2 100%   BMI 34.75 kg/m  General:   Alert,  pleasant and cooperative in NAD Head:  Normocephalic and atraumatic. Neck:  Supple; no masses or thyromegaly. Lungs:  Clear throughout to auscultation, normal respiratory effort.    Heart:  +S1, +S2, Regular rate and rhythm, No edema. Abdomen:  Soft, nontender and nondistended. Normal bowel sounds, without guarding, and without rebound.   Neurologic:  Alert and  oriented x4;  grossly normal neurologically.  Impression/Plan: Christopher [name] is here for a colonoscopy to be performed for family history of colon cancer in father.  Risks, benefits, limitations, and alternatives regarding  colonoscopy have been reviewed with the patient.  Questions have been answered.  All parties agreeable.   Virgel Manifold, MD  09/25/2019, 10:42 AM

## 2019-09-25 NOTE — Anesthesia Procedure Notes (Signed)
Performed by: Neveah Bang, CRNA Pre-anesthesia Checklist: Patient identified, Emergency Drugs available, Suction available, Timeout performed and Patient being monitored Patient Re-evaluated:Patient Re-evaluated prior to induction Oxygen Delivery Method: Nasal cannula Placement Confirmation: positive ETCO2       

## 2019-09-25 NOTE — Transfer of Care (Signed)
Immediate Anesthesia Transfer of Care Note  Patient: Christopher [name]  Procedure(s) Performed: COLONOSCOPY WITH PROPOFOL (N/A ) POLYPECTOMY (N/A Rectum)  Patient Location: PACU  Anesthesia Type: General  Level of Consciousness: awake, alert  and patient cooperative  Airway and Oxygen Therapy: Patient Spontanous Breathing and Patient connected to supplemental oxygen  Post-op Assessment: Post-op Vital signs reviewed, Patient's Cardiovascular Status Stable, Respiratory Function Stable, Patent Airway and No signs of Nausea or vomiting  Post-op Vital Signs: Reviewed and stable  Complications: No complications documented.

## 2019-09-26 LAB — SURGICAL PATHOLOGY

## 2019-09-27 ENCOUNTER — Encounter: Payer: Self-pay | Admitting: Gastroenterology

## 2019-09-27 ENCOUNTER — Encounter: Payer: Self-pay | Admitting: Family Medicine

## 2019-09-27 LAB — PATHOLOGY

## 2019-10-29 DIAGNOSIS — I10 Essential (primary) hypertension: Secondary | ICD-10-CM | POA: Diagnosis not present

## 2019-10-29 DIAGNOSIS — N1832 Chronic kidney disease, stage 3b: Secondary | ICD-10-CM | POA: Diagnosis not present

## 2019-10-31 DIAGNOSIS — N200 Calculus of kidney: Secondary | ICD-10-CM | POA: Diagnosis not present

## 2019-10-31 DIAGNOSIS — I10 Essential (primary) hypertension: Secondary | ICD-10-CM | POA: Diagnosis not present

## 2019-10-31 DIAGNOSIS — N1832 Chronic kidney disease, stage 3b: Secondary | ICD-10-CM | POA: Diagnosis not present

## 2019-11-13 DIAGNOSIS — R9341 Abnormal radiologic findings on diagnostic imaging of renal pelvis, ureter, or bladder: Secondary | ICD-10-CM | POA: Diagnosis not present

## 2019-11-13 DIAGNOSIS — D6832 Hemorrhagic disorder due to extrinsic circulating anticoagulants: Secondary | ICD-10-CM | POA: Diagnosis not present

## 2019-11-13 DIAGNOSIS — S22089A Unspecified fracture of T11-T12 vertebra, initial encounter for closed fracture: Secondary | ICD-10-CM | POA: Diagnosis not present

## 2019-11-13 DIAGNOSIS — R6889 Other general symptoms and signs: Secondary | ICD-10-CM | POA: Diagnosis not present

## 2019-11-13 DIAGNOSIS — E861 Hypovolemia: Secondary | ICD-10-CM | POA: Diagnosis not present

## 2019-11-13 DIAGNOSIS — I272 Pulmonary hypertension, unspecified: Secondary | ICD-10-CM | POA: Diagnosis not present

## 2019-11-13 DIAGNOSIS — M47816 Spondylosis without myelopathy or radiculopathy, lumbar region: Secondary | ICD-10-CM | POA: Diagnosis not present

## 2019-11-13 DIAGNOSIS — J811 Chronic pulmonary edema: Secondary | ICD-10-CM | POA: Diagnosis not present

## 2019-11-13 DIAGNOSIS — E872 Acidosis: Secondary | ICD-10-CM | POA: Diagnosis not present

## 2019-11-13 DIAGNOSIS — I340 Nonrheumatic mitral (valve) insufficiency: Secondary | ICD-10-CM | POA: Diagnosis not present

## 2019-11-13 DIAGNOSIS — I5022 Chronic systolic (congestive) heart failure: Secondary | ICD-10-CM | POA: Diagnosis not present

## 2019-11-13 DIAGNOSIS — N179 Acute kidney failure, unspecified: Secondary | ICD-10-CM | POA: Diagnosis not present

## 2019-11-13 DIAGNOSIS — N183 Chronic kidney disease, stage 3 unspecified: Secondary | ICD-10-CM | POA: Diagnosis not present

## 2019-11-13 DIAGNOSIS — Z8673 Personal history of transient ischemic attack (TIA), and cerebral infarction without residual deficits: Secondary | ICD-10-CM | POA: Diagnosis not present

## 2019-11-13 DIAGNOSIS — M4802 Spinal stenosis, cervical region: Secondary | ICD-10-CM | POA: Diagnosis not present

## 2019-11-13 DIAGNOSIS — M47819 Spondylosis without myelopathy or radiculopathy, site unspecified: Secondary | ICD-10-CM | POA: Diagnosis not present

## 2019-11-13 DIAGNOSIS — N189 Chronic kidney disease, unspecified: Secondary | ICD-10-CM | POA: Diagnosis not present

## 2019-11-13 DIAGNOSIS — R0902 Hypoxemia: Secondary | ICD-10-CM | POA: Diagnosis not present

## 2019-11-13 DIAGNOSIS — I5023 Acute on chronic systolic (congestive) heart failure: Secondary | ICD-10-CM | POA: Diagnosis not present

## 2019-11-13 DIAGNOSIS — I4430 Unspecified atrioventricular block: Secondary | ICD-10-CM | POA: Diagnosis not present

## 2019-11-13 DIAGNOSIS — S22079A Unspecified fracture of T9-T10 vertebra, initial encounter for closed fracture: Secondary | ICD-10-CM | POA: Diagnosis not present

## 2019-11-13 DIAGNOSIS — I459 Conduction disorder, unspecified: Secondary | ICD-10-CM | POA: Diagnosis not present

## 2019-11-13 DIAGNOSIS — I130 Hypertensive heart and chronic kidney disease with heart failure and stage 1 through stage 4 chronic kidney disease, or unspecified chronic kidney disease: Secondary | ICD-10-CM | POA: Diagnosis not present

## 2019-11-13 DIAGNOSIS — Z5189 Encounter for other specified aftercare: Secondary | ICD-10-CM | POA: Diagnosis not present

## 2019-11-13 DIAGNOSIS — M47814 Spondylosis without myelopathy or radiculopathy, thoracic region: Secondary | ICD-10-CM | POA: Diagnosis not present

## 2019-11-13 DIAGNOSIS — E1122 Type 2 diabetes mellitus with diabetic chronic kidney disease: Secondary | ICD-10-CM | POA: Diagnosis not present

## 2019-11-13 DIAGNOSIS — K8020 Calculus of gallbladder without cholecystitis without obstruction: Secondary | ICD-10-CM | POA: Diagnosis not present

## 2019-11-13 DIAGNOSIS — N186 End stage renal disease: Secondary | ICD-10-CM | POA: Diagnosis not present

## 2019-11-13 DIAGNOSIS — M545 Low back pain: Secondary | ICD-10-CM | POA: Diagnosis not present

## 2019-11-13 DIAGNOSIS — R918 Other nonspecific abnormal finding of lung field: Secondary | ICD-10-CM | POA: Diagnosis not present

## 2019-11-13 DIAGNOSIS — I498 Other specified cardiac arrhythmias: Secondary | ICD-10-CM | POA: Diagnosis not present

## 2019-11-13 DIAGNOSIS — M454 Ankylosing spondylitis of thoracic region: Secondary | ICD-10-CM | POA: Diagnosis not present

## 2019-11-13 DIAGNOSIS — S22088A Other fracture of T11-T12 vertebra, initial encounter for closed fracture: Secondary | ICD-10-CM | POA: Diagnosis not present

## 2019-11-13 DIAGNOSIS — N1832 Chronic kidney disease, stage 3b: Secondary | ICD-10-CM | POA: Diagnosis not present

## 2019-11-13 DIAGNOSIS — R279 Unspecified lack of coordination: Secondary | ICD-10-CM | POA: Diagnosis not present

## 2019-11-13 DIAGNOSIS — R570 Cardiogenic shock: Secondary | ICD-10-CM | POA: Diagnosis not present

## 2019-11-13 DIAGNOSIS — K529 Noninfective gastroenteritis and colitis, unspecified: Secondary | ICD-10-CM | POA: Diagnosis not present

## 2019-11-13 DIAGNOSIS — I517 Cardiomegaly: Secondary | ICD-10-CM | POA: Diagnosis not present

## 2019-11-13 DIAGNOSIS — S0990XA Unspecified injury of head, initial encounter: Secondary | ICD-10-CM | POA: Diagnosis not present

## 2019-11-13 DIAGNOSIS — M40209 Unspecified kyphosis, site unspecified: Secondary | ICD-10-CM | POA: Diagnosis not present

## 2019-11-13 DIAGNOSIS — S32029A Unspecified fracture of second lumbar vertebra, initial encounter for closed fracture: Secondary | ICD-10-CM | POA: Diagnosis not present

## 2019-11-13 DIAGNOSIS — R2689 Other abnormalities of gait and mobility: Secondary | ICD-10-CM | POA: Diagnosis not present

## 2019-11-13 DIAGNOSIS — M4804 Spinal stenosis, thoracic region: Secondary | ICD-10-CM | POA: Diagnosis not present

## 2019-11-13 DIAGNOSIS — I472 Ventricular tachycardia: Secondary | ICD-10-CM | POA: Diagnosis not present

## 2019-11-13 DIAGNOSIS — E869 Volume depletion, unspecified: Secondary | ICD-10-CM | POA: Diagnosis not present

## 2019-11-13 DIAGNOSIS — S22078A Other fracture of T9-T10 vertebra, initial encounter for closed fracture: Secondary | ICD-10-CM | POA: Diagnosis not present

## 2019-11-13 DIAGNOSIS — R001 Bradycardia, unspecified: Secondary | ICD-10-CM | POA: Diagnosis not present

## 2019-11-13 DIAGNOSIS — E875 Hyperkalemia: Secondary | ICD-10-CM | POA: Diagnosis not present

## 2019-11-13 DIAGNOSIS — S22000A Wedge compression fracture of unspecified thoracic vertebra, initial encounter for closed fracture: Secondary | ICD-10-CM | POA: Diagnosis not present

## 2019-11-13 DIAGNOSIS — N170 Acute kidney failure with tubular necrosis: Secondary | ICD-10-CM | POA: Diagnosis not present

## 2019-11-13 DIAGNOSIS — S22089D Unspecified fracture of T11-T12 vertebra, subsequent encounter for fracture with routine healing: Secondary | ICD-10-CM | POA: Diagnosis not present

## 2019-11-13 DIAGNOSIS — Z01810 Encounter for preprocedural cardiovascular examination: Secondary | ICD-10-CM | POA: Diagnosis not present

## 2019-11-13 DIAGNOSIS — I21A1 Myocardial infarction type 2: Secondary | ICD-10-CM | POA: Diagnosis not present

## 2019-11-13 DIAGNOSIS — I255 Ischemic cardiomyopathy: Secondary | ICD-10-CM | POA: Diagnosis not present

## 2019-11-13 DIAGNOSIS — R52 Pain, unspecified: Secondary | ICD-10-CM | POA: Diagnosis not present

## 2019-11-13 DIAGNOSIS — S299XXA Unspecified injury of thorax, initial encounter: Secondary | ICD-10-CM | POA: Diagnosis not present

## 2019-11-13 DIAGNOSIS — Z743 Need for continuous supervision: Secondary | ICD-10-CM | POA: Diagnosis not present

## 2019-11-13 DIAGNOSIS — S3992XA Unspecified injury of lower back, initial encounter: Secondary | ICD-10-CM | POA: Diagnosis not present

## 2019-11-13 DIAGNOSIS — D696 Thrombocytopenia, unspecified: Secondary | ICD-10-CM | POA: Diagnosis not present

## 2019-11-13 DIAGNOSIS — M6281 Muscle weakness (generalized): Secondary | ICD-10-CM | POA: Diagnosis not present

## 2019-11-13 DIAGNOSIS — R0602 Shortness of breath: Secondary | ICD-10-CM | POA: Diagnosis not present

## 2019-11-13 DIAGNOSIS — M8580 Other specified disorders of bone density and structure, unspecified site: Secondary | ICD-10-CM | POA: Diagnosis not present

## 2019-11-13 DIAGNOSIS — G459 Transient cerebral ischemic attack, unspecified: Secondary | ICD-10-CM | POA: Diagnosis not present

## 2019-11-13 DIAGNOSIS — I2510 Atherosclerotic heart disease of native coronary artery without angina pectoris: Secondary | ICD-10-CM | POA: Diagnosis not present

## 2019-11-13 DIAGNOSIS — I132 Hypertensive heart and chronic kidney disease with heart failure and with stage 5 chronic kidney disease, or end stage renal disease: Secondary | ICD-10-CM | POA: Diagnosis not present

## 2019-11-13 DIAGNOSIS — M549 Dorsalgia, unspecified: Secondary | ICD-10-CM | POA: Diagnosis not present

## 2019-11-13 DIAGNOSIS — I4892 Unspecified atrial flutter: Secondary | ICD-10-CM | POA: Diagnosis not present

## 2019-11-13 DIAGNOSIS — Z7901 Long term (current) use of anticoagulants: Secondary | ICD-10-CM | POA: Diagnosis not present

## 2019-11-13 DIAGNOSIS — M5030 Other cervical disc degeneration, unspecified cervical region: Secondary | ICD-10-CM | POA: Diagnosis not present

## 2019-11-13 DIAGNOSIS — R42 Dizziness and giddiness: Secondary | ICD-10-CM | POA: Diagnosis not present

## 2019-11-13 DIAGNOSIS — I4949 Other premature depolarization: Secondary | ICD-10-CM | POA: Diagnosis not present

## 2019-11-14 DIAGNOSIS — S22089A Unspecified fracture of T11-T12 vertebra, initial encounter for closed fracture: Secondary | ICD-10-CM | POA: Diagnosis not present

## 2019-11-14 DIAGNOSIS — M4804 Spinal stenosis, thoracic region: Secondary | ICD-10-CM | POA: Diagnosis not present

## 2019-11-14 DIAGNOSIS — M40209 Unspecified kyphosis, site unspecified: Secondary | ICD-10-CM | POA: Diagnosis not present

## 2019-11-14 DIAGNOSIS — E875 Hyperkalemia: Secondary | ICD-10-CM | POA: Insufficient documentation

## 2019-11-20 ENCOUNTER — Other Ambulatory Visit: Payer: Self-pay | Admitting: Family Medicine

## 2019-11-20 DIAGNOSIS — M170 Bilateral primary osteoarthritis of knee: Secondary | ICD-10-CM

## 2019-11-20 NOTE — Telephone Encounter (Signed)
Requested medication (s) are due for refill today: no  Requested medication (s) are on the active medication list: no  Last refill:  Medication discontinued on 02/20/18  Future visit scheduled: yes  Notes to clinic:  Please review for refill. Medication not on current med list    Requested Prescriptions  Pending Prescriptions Disp Refills   etodolac (LODINE) 500 MG tablet [Pharmacy Med Name: ETODOLAC 500MG  TABLETS] 180 tablet 1    Sig: TAKE 1 TABLET BY MOUTH TWICE DAILY      Analgesics:  NSAIDS Failed - 11/20/2019  9:54 AM      Failed - Cr in normal range and within 360 days    Creatinine  Date Value Ref Range Status  10/15/2014 1.81 (H) mg/dL Final    Comment:    0.61-1.24 NOTE: New Reference Range  05/21/14    Creatinine, Ser  Date Value Ref Range Status  10/23/2018 3.30 (H) 0.76 - 1.27 mg/dL Final          Failed - HGB in normal range and within 360 days    Hemoglobin  Date Value Ref Range Status  10/13/2018 10.8 (L) 13.0 - 17.0 g/dL Final  12/16/2016 14.1 13.0 - 17.7 g/dL Final          Passed - Patient is not pregnant      Passed - Valid encounter within last 12 months    Recent Outpatient Visits           3 months ago Hyperuricemia   [place], [place]   8 months ago Coronary artery abnormality   [facility] Juline Patch, MD   1 year ago Hospital discharge follow-up   [facility] Juline Patch, MD   1 year ago Impaired renal function   [facility] Juline Patch, MD   1 year ago Essential (primary) hypertension   [place], Deanna C, MD       Future Appointments             In 3 months Juline Patch, MD [facility], [facility]

## 2019-11-23 DIAGNOSIS — R2689 Other abnormalities of gait and mobility: Secondary | ICD-10-CM | POA: Diagnosis not present

## 2019-11-23 DIAGNOSIS — N183 Chronic kidney disease, stage 3 unspecified: Secondary | ICD-10-CM | POA: Diagnosis not present

## 2019-11-23 DIAGNOSIS — Z743 Need for continuous supervision: Secondary | ICD-10-CM | POA: Diagnosis not present

## 2019-11-23 DIAGNOSIS — S22089D Unspecified fracture of T11-T12 vertebra, subsequent encounter for fracture with routine healing: Secondary | ICD-10-CM | POA: Diagnosis not present

## 2019-11-23 DIAGNOSIS — R5381 Other malaise: Secondary | ICD-10-CM | POA: Diagnosis not present

## 2019-11-23 DIAGNOSIS — I21A1 Myocardial infarction type 2: Secondary | ICD-10-CM | POA: Diagnosis not present

## 2019-11-23 DIAGNOSIS — R570 Cardiogenic shock: Secondary | ICD-10-CM | POA: Diagnosis not present

## 2019-11-23 DIAGNOSIS — I5023 Acute on chronic systolic (congestive) heart failure: Secondary | ICD-10-CM | POA: Diagnosis not present

## 2019-11-23 DIAGNOSIS — M5416 Radiculopathy, lumbar region: Secondary | ICD-10-CM | POA: Diagnosis not present

## 2019-11-23 DIAGNOSIS — I509 Heart failure, unspecified: Secondary | ICD-10-CM | POA: Diagnosis not present

## 2019-11-23 DIAGNOSIS — M545 Low back pain: Secondary | ICD-10-CM | POA: Diagnosis not present

## 2019-11-23 DIAGNOSIS — R279 Unspecified lack of coordination: Secondary | ICD-10-CM | POA: Diagnosis not present

## 2019-11-23 DIAGNOSIS — S22080S Wedge compression fracture of T11-T12 vertebra, sequela: Secondary | ICD-10-CM | POA: Diagnosis not present

## 2019-11-23 DIAGNOSIS — Z09 Encounter for follow-up examination after completed treatment for conditions other than malignant neoplasm: Secondary | ICD-10-CM | POA: Diagnosis not present

## 2019-11-23 DIAGNOSIS — Z5189 Encounter for other specified aftercare: Secondary | ICD-10-CM | POA: Diagnosis not present

## 2019-11-23 DIAGNOSIS — M6281 Muscle weakness (generalized): Secondary | ICD-10-CM | POA: Diagnosis not present

## 2019-11-23 DIAGNOSIS — I130 Hypertensive heart and chronic kidney disease with heart failure and stage 1 through stage 4 chronic kidney disease, or unspecified chronic kidney disease: Secondary | ICD-10-CM | POA: Diagnosis not present

## 2019-11-29 DIAGNOSIS — R5381 Other malaise: Secondary | ICD-10-CM | POA: Diagnosis not present

## 2019-11-29 DIAGNOSIS — Z5189 Encounter for other specified aftercare: Secondary | ICD-10-CM | POA: Diagnosis not present

## 2019-11-29 DIAGNOSIS — S22080S Wedge compression fracture of T11-T12 vertebra, sequela: Secondary | ICD-10-CM | POA: Diagnosis not present

## 2019-11-29 DIAGNOSIS — R570 Cardiogenic shock: Secondary | ICD-10-CM | POA: Diagnosis not present

## 2019-11-29 DIAGNOSIS — I509 Heart failure, unspecified: Secondary | ICD-10-CM | POA: Diagnosis not present

## 2019-11-29 DIAGNOSIS — Z09 Encounter for follow-up examination after completed treatment for conditions other than malignant neoplasm: Secondary | ICD-10-CM | POA: Diagnosis not present

## 2019-11-29 DIAGNOSIS — M5416 Radiculopathy, lumbar region: Secondary | ICD-10-CM | POA: Diagnosis not present

## 2019-12-04 DIAGNOSIS — S22080S Wedge compression fracture of T11-T12 vertebra, sequela: Secondary | ICD-10-CM | POA: Diagnosis not present

## 2019-12-05 ENCOUNTER — Telehealth: Payer: Self-pay | Admitting: Family Medicine

## 2019-12-05 NOTE — Telephone Encounter (Signed)
Pt called in for assistance. Pt says that he was in the hospital and was prescribed Etodolac 500 MG, pt says that he was told by ED to request a refill for medication and also pt is needing a Rx for a lift chair, pt would like to have his daughter pick up from office when ready.

## 2019-12-06 NOTE — Telephone Encounter (Signed)
Called daughter- cant fill etodolac due to kidneys and get ortho to write for lift chair

## 2019-12-14 DIAGNOSIS — M6281 Muscle weakness (generalized): Secondary | ICD-10-CM | POA: Diagnosis not present

## 2019-12-14 DIAGNOSIS — S22089D Unspecified fracture of T11-T12 vertebra, subsequent encounter for fracture with routine healing: Secondary | ICD-10-CM | POA: Diagnosis not present

## 2019-12-14 DIAGNOSIS — R279 Unspecified lack of coordination: Secondary | ICD-10-CM | POA: Diagnosis not present

## 2019-12-14 DIAGNOSIS — R2689 Other abnormalities of gait and mobility: Secondary | ICD-10-CM | POA: Diagnosis not present

## 2019-12-15 DIAGNOSIS — M6281 Muscle weakness (generalized): Secondary | ICD-10-CM | POA: Diagnosis not present

## 2019-12-15 DIAGNOSIS — S22089D Unspecified fracture of T11-T12 vertebra, subsequent encounter for fracture with routine healing: Secondary | ICD-10-CM | POA: Diagnosis not present

## 2019-12-15 DIAGNOSIS — R2689 Other abnormalities of gait and mobility: Secondary | ICD-10-CM | POA: Diagnosis not present

## 2019-12-15 DIAGNOSIS — R279 Unspecified lack of coordination: Secondary | ICD-10-CM | POA: Diagnosis not present

## 2019-12-16 DIAGNOSIS — R2689 Other abnormalities of gait and mobility: Secondary | ICD-10-CM | POA: Diagnosis not present

## 2019-12-16 DIAGNOSIS — R279 Unspecified lack of coordination: Secondary | ICD-10-CM | POA: Diagnosis not present

## 2019-12-16 DIAGNOSIS — S22089D Unspecified fracture of T11-T12 vertebra, subsequent encounter for fracture with routine healing: Secondary | ICD-10-CM | POA: Diagnosis not present

## 2019-12-16 DIAGNOSIS — M6281 Muscle weakness (generalized): Secondary | ICD-10-CM | POA: Diagnosis not present

## 2019-12-17 IMAGING — CT CT CHEST W/O CM
1 series · 14 of 34 positions shown, 18 images · non-contrast
Comparison: CXR 06/19/2017

CLINICAL DATA: Lung nodule seen on prior CXR. History of pneumonia
3 weeks ago. No prior history of cancer.

EXAM:
CT CHEST WITHOUT CONTRAST
TECHNIQUE: Multidetector CT imaging of the chest was performed following the
standard protocol without IV contrast.

[Series 2: thorax · axial · 0.92mm/px · z∈[-533,-241]mm · 14 of 172 slices shown, 18 images]
[im 13/172  mediastinal]
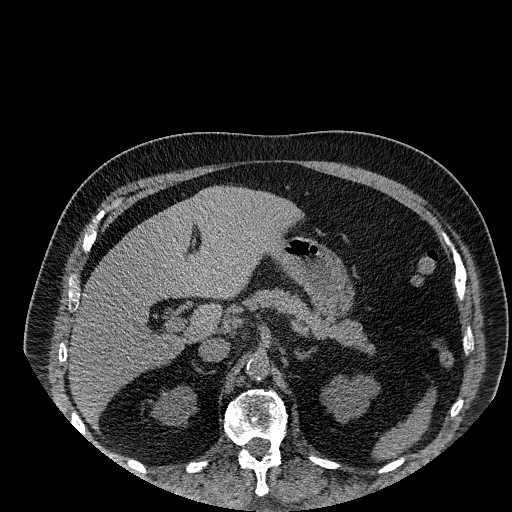
[im 13/172  lung]
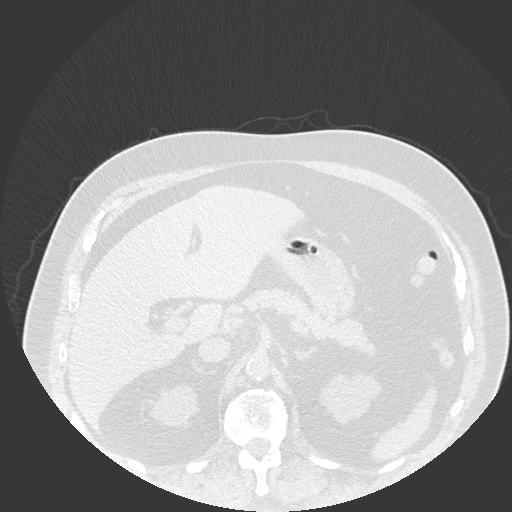
[im 26/172  lung]
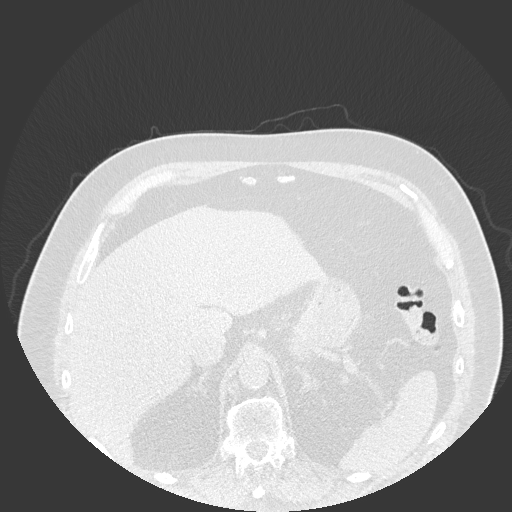
[im 35/172  lung]
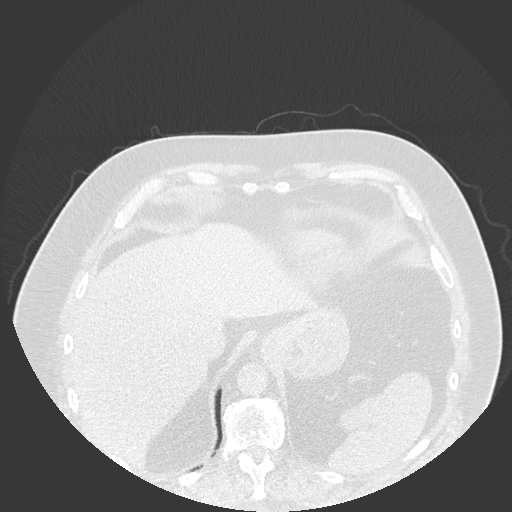
[im 51/172  lung]
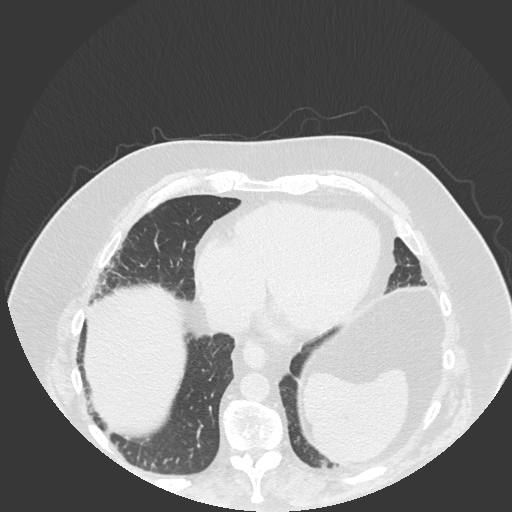
[im 64/172  mediastinal]
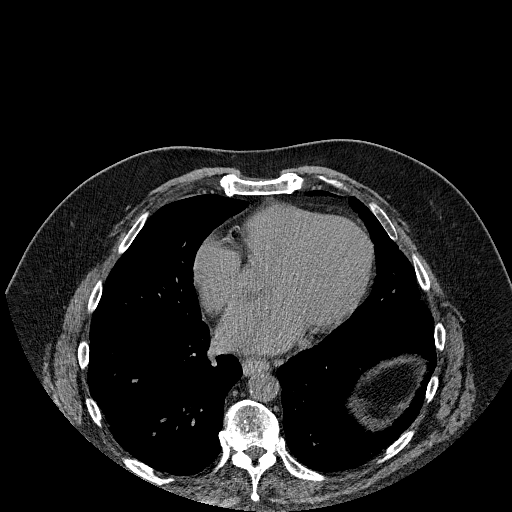
[im 64/172  lung]
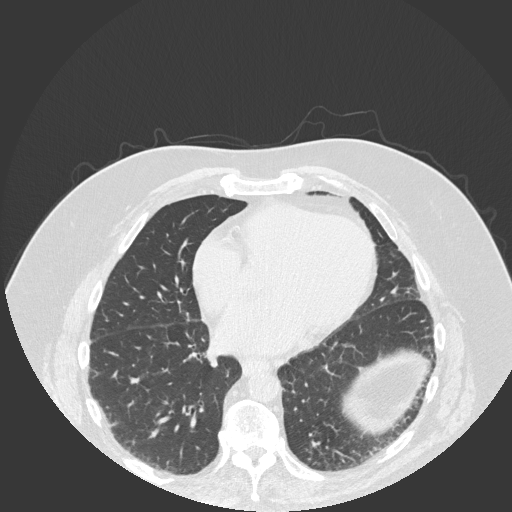
[im 70/172  lung]
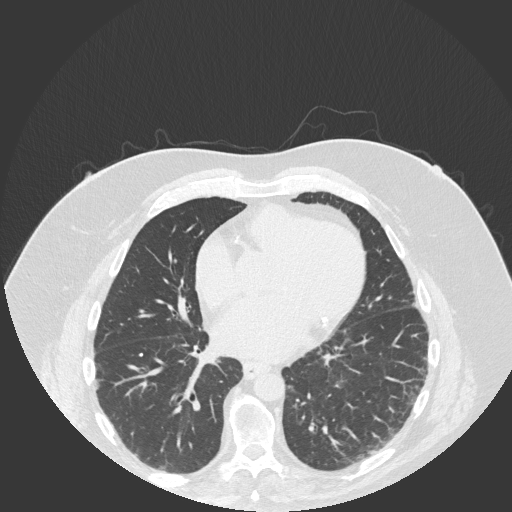
[im 82/172  lung]
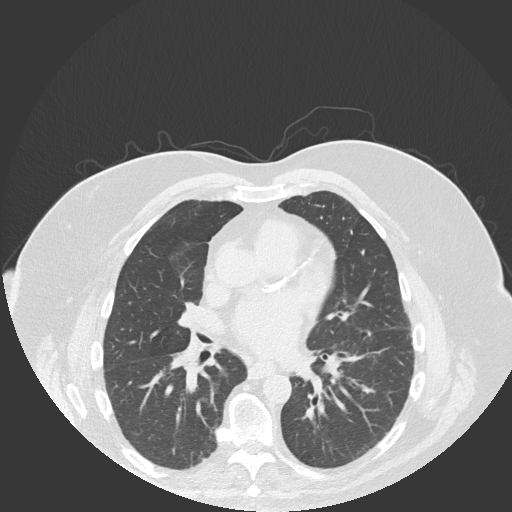
[im 91/172  lung]
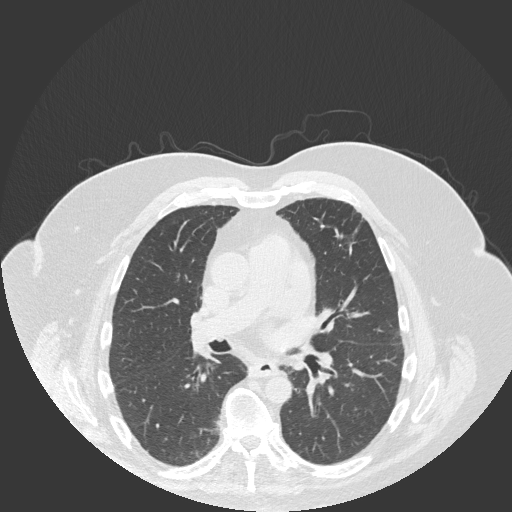
[im 102/172  mediastinal]
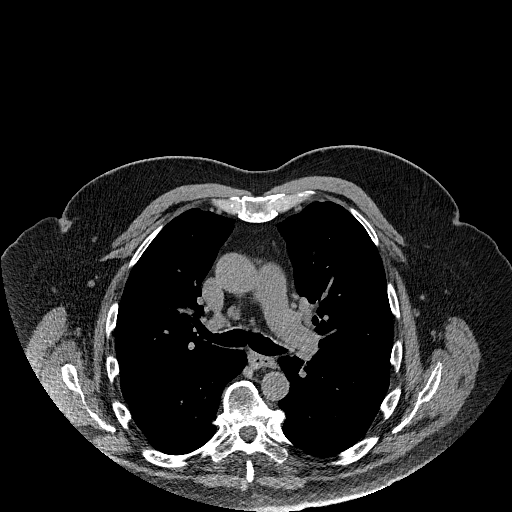
[im 102/172  lung]
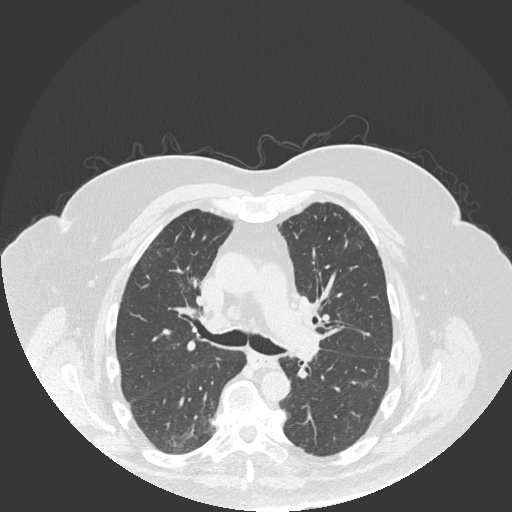
[im 108/172  lung]
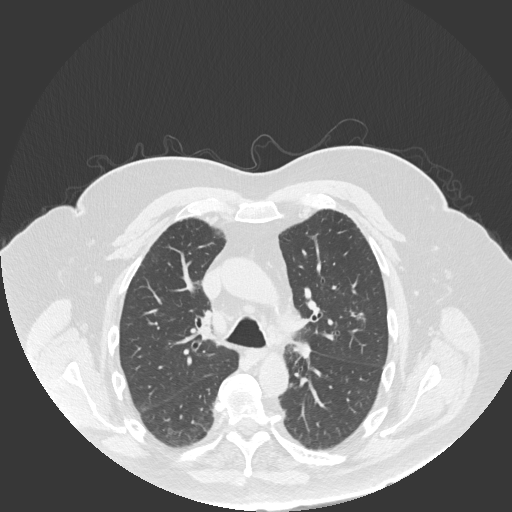
[im 127/172  lung]
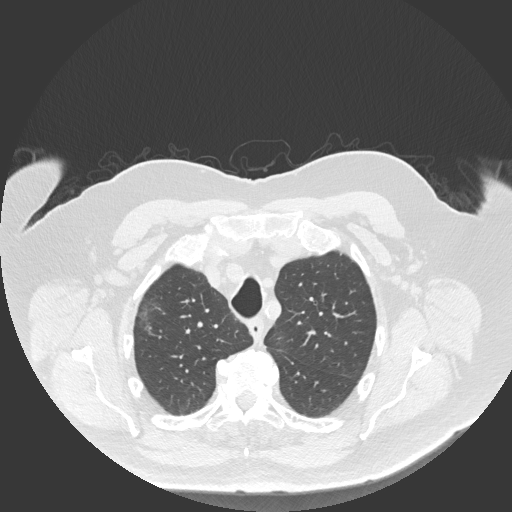
[im 137/172  lung]
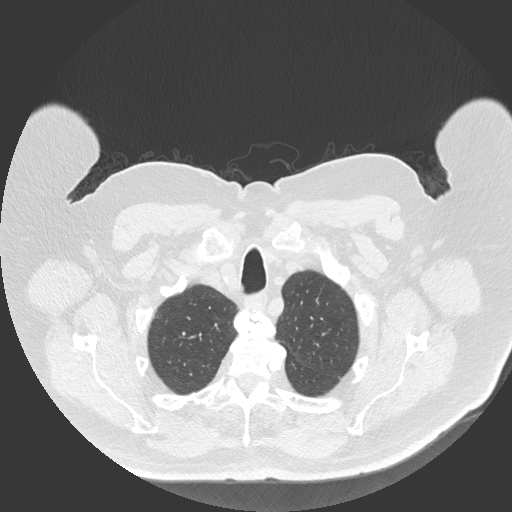
[im 146/172  mediastinal]
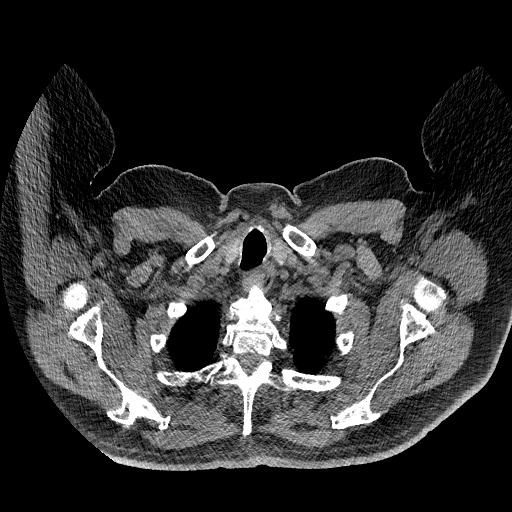
[im 146/172  lung]
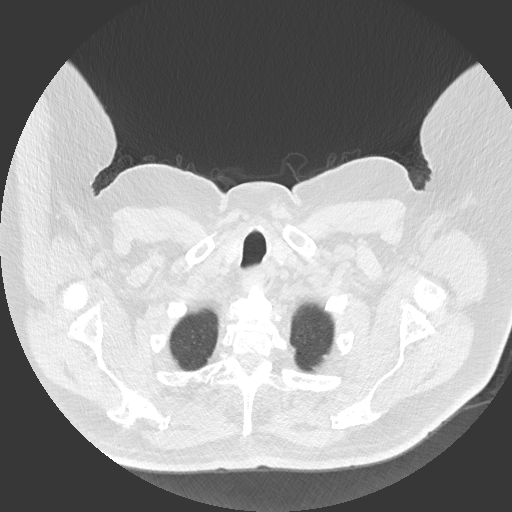
[im 159/172  lung]
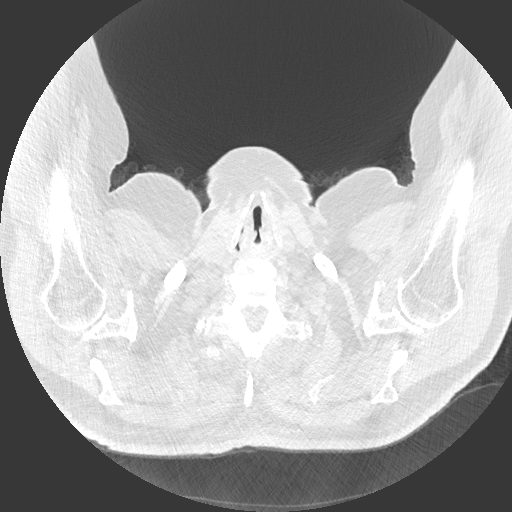

[14 of 34 positions shown; findings below may reference images not displayed]

FINDINGS: Cardiovascular: Atherosclerosis at the origins of the great vessels.
Conventional branch pattern of the great vessels. No aortic
aneurysm. There is mild aortic atherosclerosis. Unremarkable
noncontrast appearance of the pulmonary vasculature. Cardiomegaly
with left main and three-vessel coronary arteriosclerosis. No
pericardial effusion.

Mediastinum/Nodes: Mildly enlarged likely reactive mediastinal lymph
nodes, the largest approximately 13 mm precarinal. No definite hilar
adenopathy given limitations of a noncontrast study. Trachea and
main stem bronchi are patent. Esophagus is unremarkable. No
thyromegaly.

Lungs/Pleura: 6 mm in average left lower lobe pulmonary nodule
adjacent to partially calcified 4 mm nodule at the left lung base,
series [DATE] and 113. Calcified granuloma noted in the superior
segment of right lower lobe series [DATE]. Faint ground-glass
opacities in the both upper lobes and superior segment of right and
left lower lobes are identified likely representing stigmata of
alveolitis or pneumonitis. Mild peribronchial thickening is seen.
The area in question within the left upper lobe may reflect a small
area bronchiolitis given slight tree-in-bud opacities noted.

Upper Abdomen: Normal bilateral adrenal glands. Nonobstructing
right-sided renal calculi. Too small to further characterize
bilateral upper pole renal cysts. Mild fatty infiltration of the
liver. No splenomegaly. Unremarkable pancreas.

Musculoskeletal: Bilateral gynecomastia. Thoracic spondylosis with
diffuse idiopathic skeletal hyperostosis.
IMPRESSION: 1. Tiny tree-in-bud opacities in the left upper lobe that may
reflect a small focus of bronchiolitis. No dominant mass is seen.
2. There are calcified and noncalcified nodules as above described,
the calcified nodules likely representing small granulomas in both
lower lobes. A noncalcified 6 mm in average nodule is seen in the
medial left lower lobe. Non-contrast chest CT at 3-6 months is
recommended. If the nodules are stable at time of repeat CT, then
future CT at 18-24 months (from today's scan) is considered optional
for low-risk patients, but is recommended for high-risk patients.
This recommendation follows the consensus statement: Guidelines for
Management of Incidental Pulmonary Nodules Detected on CT Images:
3. Mild attic steatosis. Bilateral upper pole renal cysts.
Nonobstructing right-sided renal calculi.
4. Aortic atherosclerosis.
5. Cardiomegaly with left main and three-vessel coronary
arteriosclerosis.

Aortic Atherosclerosis ([id number]-H9G.G).

## 2019-12-20 DIAGNOSIS — Z79891 Long term (current) use of opiate analgesic: Secondary | ICD-10-CM | POA: Diagnosis not present

## 2019-12-20 DIAGNOSIS — S22080D Wedge compression fracture of T11-T12 vertebra, subsequent encounter for fracture with routine healing: Secondary | ICD-10-CM | POA: Diagnosis not present

## 2019-12-20 DIAGNOSIS — Z8673 Personal history of transient ischemic attack (TIA), and cerebral infarction without residual deficits: Secondary | ICD-10-CM | POA: Diagnosis not present

## 2019-12-20 DIAGNOSIS — I2510 Atherosclerotic heart disease of native coronary artery without angina pectoris: Secondary | ICD-10-CM | POA: Diagnosis not present

## 2019-12-20 DIAGNOSIS — I272 Pulmonary hypertension, unspecified: Secondary | ICD-10-CM | POA: Diagnosis not present

## 2019-12-20 DIAGNOSIS — E1122 Type 2 diabetes mellitus with diabetic chronic kidney disease: Secondary | ICD-10-CM | POA: Diagnosis not present

## 2019-12-20 DIAGNOSIS — E785 Hyperlipidemia, unspecified: Secondary | ICD-10-CM | POA: Diagnosis not present

## 2019-12-20 DIAGNOSIS — Z9181 History of falling: Secondary | ICD-10-CM | POA: Diagnosis not present

## 2019-12-20 DIAGNOSIS — N183 Chronic kidney disease, stage 3 unspecified: Secondary | ICD-10-CM | POA: Diagnosis not present

## 2019-12-20 DIAGNOSIS — I4892 Unspecified atrial flutter: Secondary | ICD-10-CM | POA: Diagnosis not present

## 2019-12-20 DIAGNOSIS — D631 Anemia in chronic kidney disease: Secondary | ICD-10-CM | POA: Diagnosis not present

## 2019-12-20 DIAGNOSIS — Z7901 Long term (current) use of anticoagulants: Secondary | ICD-10-CM | POA: Diagnosis not present

## 2019-12-20 DIAGNOSIS — Z955 Presence of coronary angioplasty implant and graft: Secondary | ICD-10-CM | POA: Diagnosis not present

## 2019-12-20 DIAGNOSIS — I130 Hypertensive heart and chronic kidney disease with heart failure and stage 1 through stage 4 chronic kidney disease, or unspecified chronic kidney disease: Secondary | ICD-10-CM | POA: Diagnosis not present

## 2019-12-20 DIAGNOSIS — I5022 Chronic systolic (congestive) heart failure: Secondary | ICD-10-CM | POA: Diagnosis not present

## 2019-12-24 DIAGNOSIS — N183 Chronic kidney disease, stage 3 unspecified: Secondary | ICD-10-CM | POA: Diagnosis not present

## 2019-12-24 DIAGNOSIS — Z7901 Long term (current) use of anticoagulants: Secondary | ICD-10-CM | POA: Diagnosis not present

## 2019-12-24 DIAGNOSIS — E1122 Type 2 diabetes mellitus with diabetic chronic kidney disease: Secondary | ICD-10-CM | POA: Diagnosis not present

## 2019-12-24 DIAGNOSIS — I272 Pulmonary hypertension, unspecified: Secondary | ICD-10-CM | POA: Diagnosis not present

## 2019-12-24 DIAGNOSIS — S22080D Wedge compression fracture of T11-T12 vertebra, subsequent encounter for fracture with routine healing: Secondary | ICD-10-CM | POA: Diagnosis not present

## 2019-12-24 DIAGNOSIS — I2510 Atherosclerotic heart disease of native coronary artery without angina pectoris: Secondary | ICD-10-CM | POA: Diagnosis not present

## 2019-12-24 DIAGNOSIS — I4892 Unspecified atrial flutter: Secondary | ICD-10-CM | POA: Diagnosis not present

## 2019-12-24 DIAGNOSIS — Z79891 Long term (current) use of opiate analgesic: Secondary | ICD-10-CM | POA: Diagnosis not present

## 2019-12-24 DIAGNOSIS — Z955 Presence of coronary angioplasty implant and graft: Secondary | ICD-10-CM | POA: Diagnosis not present

## 2019-12-24 DIAGNOSIS — I5022 Chronic systolic (congestive) heart failure: Secondary | ICD-10-CM | POA: Diagnosis not present

## 2019-12-24 DIAGNOSIS — Z8673 Personal history of transient ischemic attack (TIA), and cerebral infarction without residual deficits: Secondary | ICD-10-CM | POA: Diagnosis not present

## 2019-12-24 DIAGNOSIS — D631 Anemia in chronic kidney disease: Secondary | ICD-10-CM | POA: Diagnosis not present

## 2019-12-24 DIAGNOSIS — E785 Hyperlipidemia, unspecified: Secondary | ICD-10-CM | POA: Diagnosis not present

## 2019-12-24 DIAGNOSIS — I130 Hypertensive heart and chronic kidney disease with heart failure and stage 1 through stage 4 chronic kidney disease, or unspecified chronic kidney disease: Secondary | ICD-10-CM | POA: Diagnosis not present

## 2019-12-24 DIAGNOSIS — Z9181 History of falling: Secondary | ICD-10-CM | POA: Diagnosis not present

## 2019-12-25 ENCOUNTER — Encounter: Payer: Self-pay | Admitting: Family Medicine

## 2019-12-25 ENCOUNTER — Ambulatory Visit (INDEPENDENT_AMBULATORY_CARE_PROVIDER_SITE_OTHER): Payer: Medicare Other | Admitting: Family Medicine

## 2019-12-25 ENCOUNTER — Other Ambulatory Visit: Payer: Self-pay

## 2019-12-25 VITALS — BP 130/80 | HR 68 | Ht 75.0 in | Wt 271.0 lb

## 2019-12-25 DIAGNOSIS — Z09 Encounter for follow-up examination after completed treatment for conditions other than malignant neoplasm: Secondary | ICD-10-CM

## 2019-12-25 DIAGNOSIS — Z862 Personal history of diseases of the blood and blood-forming organs and certain disorders involving the immune mechanism: Secondary | ICD-10-CM | POA: Diagnosis not present

## 2019-12-25 DIAGNOSIS — Z8639 Personal history of other endocrine, nutritional and metabolic disease: Secondary | ICD-10-CM

## 2019-12-25 DIAGNOSIS — M546 Pain in thoracic spine: Secondary | ICD-10-CM

## 2019-12-25 DIAGNOSIS — Z23 Encounter for immunization: Secondary | ICD-10-CM | POA: Diagnosis not present

## 2019-12-25 DIAGNOSIS — R42 Dizziness and giddiness: Secondary | ICD-10-CM | POA: Diagnosis not present

## 2019-12-25 DIAGNOSIS — L89151 Pressure ulcer of sacral region, stage 1: Secondary | ICD-10-CM

## 2019-12-25 DIAGNOSIS — M545 Low back pain, unspecified: Secondary | ICD-10-CM

## 2019-12-25 DIAGNOSIS — Z7689 Persons encountering health services in other specified circumstances: Secondary | ICD-10-CM | POA: Diagnosis not present

## 2019-12-25 NOTE — Progress Notes (Signed)
Date:  12/25/2019   Name:  Christopher [name]   DOB:  [birth year]   MRN:  [id number]   Chief Complaint: Follow-up (Christopher [name]- daughter- pt is aware of daughter and is ok with her being in the room- fall on Aug 31- fx vertebrae) and Flu Vaccine  Patient is a 74 year old male who presents for a hospital followup exam s/p rehab discharge. The patient reports the following problems: needs analgesic/ Question farxiga. Health maintenance has been reviewed.   Lab Results  Component Value Date   CREATININE 3.30 (H) 10/23/2018   BUN 74 (H) 10/23/2018   NA 139 10/23/2018   K 4.9 10/23/2018   CL 100 10/23/2018   CO2 20 10/23/2018   Lab Results  Component Value Date   CHOL 104 01/24/2018   HDL 21 (L) 01/24/2018   LDLCALC 61 01/24/2018   TRIG 112 01/24/2018   CHOLHDL 5.0 01/24/2018   Lab Results  Component Value Date   TSH 3.423 06/12/2014   No results found for: HGBA1C Lab Results  Component Value Date   WBC 7.8 10/13/2018   HGB 10.8 (L) 10/13/2018   HCT 34.7 (L) 10/13/2018   MCV 94.6 10/13/2018   PLT 167 10/13/2018   Lab Results  Component Value Date   ALT 18 12/16/2016   AST 18 12/16/2016   ALKPHOS 96 12/16/2016   BILITOT 0.5 12/16/2016     Review of Systems  Constitutional: Negative for chills and fever.  HENT: Negative for drooling, ear discharge, ear pain and sore throat.   Respiratory: Negative for cough, shortness of breath and wheezing.   Cardiovascular: Negative for chest pain, palpitations and leg swelling.  Gastrointestinal: Negative for abdominal pain, blood in stool, constipation, diarrhea and nausea.  Endocrine: Negative for polydipsia.  Genitourinary: Negative for dysuria, frequency, hematuria and urgency.  Musculoskeletal: Negative for back pain, myalgias and neck pain.  Skin: Negative for rash.  Allergic/Immunologic: Negative for environmental allergies.  Neurological: Negative for dizziness and headaches.  Hematological: Does not bruise/bleed  easily.  Psychiatric/Behavioral: Negative for suicidal ideas. The patient is not nervous/anxious.     Patient Active Problem List   Diagnosis Date Noted  . Family history of colon cancer   . Polyp of colon   . Ischemic cardiomyopathy 12/19/2018  . Acute renal failure superimposed on stage 3 chronic kidney disease ([place]) 10/14/2018  . Atrial flutter ([place]) 10/14/2018  . Chronic systolic heart failure ([place]) 10/14/2018  . History of TIA (transient ischemic attack) 10/14/2018  . Volume overload 10/14/2018  . Cerebral vascular disease 09/08/2018  . TIA (transient ischemic attack) 12/12/2017  . 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 Encounter for general adult medical examination without abnormal findings 10/22/2014  . Disorder of kidney and ureter 10/22/2014  . Renal colic 22/04/5425  . 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 Creatinine elevation 10/22/2014  . Essential (primary) hypertension 10/22/2014  . H/O hypercholesterolemia 10/22/2014  . Dysmetabolic syndrome 09/05/7626  . H/O acute myocardial infarction 10/22/2014  . Adiposity 10/22/2014  . Impaired renal function 10/22/2014  . Need for vaccination 10/22/2014  . 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 S/P coronary artery stent placement 04/09/2014  . Primary osteoarthritis of right knee 03/26/2014  . Chronic kidney disease, stage [name] (moderate) ([facility]) 01/25/2011    No Known Allergies  Past Surgical History:  Procedure Laterality Date  . CARDIAC CATHETERIZATION  2000   stent   . COLONOSCOPY  2011  . COLONOSCOPY WITH  PROPOFOL N/A 09/25/2019   Procedure: COLONOSCOPY WITH PROPOFOL;  Surgeon: Virgel Manifold, MD;  Location: [facility];  Service: Endoscopy;  Laterality: N/A;  priority 4  . JOINT REPLACEMENT Right    knee  . KNEE ARTHROSCOPY Right 2016  . POLYPECTOMY N/A 09/25/2019   Procedure: POLYPECTOMY;  Surgeon: Virgel Manifold, MD;  Location: [facility];  Service: Endoscopy;  Laterality: N/A;    Social History   Tobacco Use  . Smoking status: Former Smoker    Types: Cigarettes    Quit date: 1982    Years since quitting: 39.8  . Smokeless tobacco: Never Used  Vaping Use  . Vaping Use: Never used  Substance Use Topics  . Alcohol use: No    Alcohol/week: 0.0 standard drinks  . Drug use: No     Medication list has been reviewed and updated.  Current Meds  Medication Sig  . allopurinol (ZYLOPRIM) 100 MG tablet TAKE 1 TABLET(100 MG) BY MOUTH DAILY  . amiodarone (PACERONE) 200 MG tablet Take 200 mg by mouth daily.  Marland Kitchen apixaban (ELIQUIS) 5 MG TABS tablet Take 1 tablet (5 mg total) by mouth 2 (two) times daily. cardio  . atorvastatin (LIPITOR) 80 MG tablet Take 1 tablet (80 mg total) by mouth daily. cardio  . dapagliflozin propanediol (FARXIGA) 10 MG TABS tablet Take 1 tablet by mouth daily. [facility]  . diclofenac sodium (VOLTAREN) 1 % GEL Apply 4 g topically 4 (four) times daily.  Marland Kitchen ENTRESTO 97-103 MG Take 1 tablet by mouth 2 (two) times daily.  . metoprolol (TOPROL-XL) 200 MG 24 hr tablet Take 1 tablet by mouth daily. cardio  . SODIUM BICARBONATE PO Take by mouth 2 (two) times daily.  Marland Kitchen spironolactone (ALDACTONE) 25 MG tablet Take 1 tablet by mouth daily. cardio  . torsemide (DEMADEX) 20 MG tablet Take 20 mg by mouth daily.    PHQ 2/9 Scores 12/25/2019 08/22/2019 04/02/2019 03/13/2019  PHQ - 2 Score 0 0 0 0  PHQ- 9 Score 0 0 - 0    GAD 7 : Generalized Anxiety Score 12/25/2019 08/22/2019 03/13/2019  Nervous, Anxious, on Edge 0 0 0  Control/stop worrying 0 0 0  Worry too much - different things 0 0 0  Trouble relaxing 0 0 0  Restless 0 0 0  Easily annoyed or irritable 0 0 0  Afraid - awful might happen 0 0 0  Total GAD 7 Score 0 0 0    BP Readings from Last 3 Encounters:  12/25/19 130/80  09/25/19 119/78  08/22/19 120/78    Physical Exam Vitals and nursing note reviewed.  HENT:     Head: Normocephalic.     Right Ear:  External ear normal.     Left Ear: External ear normal.     Nose: Nose normal.     Mouth/Throat:     Mouth: Mucous membranes are dry.     Pharynx: Posterior oropharyngeal erythema present.  Eyes:     General: No scleral icterus.       Right eye: No discharge.        Left eye: No discharge.     Conjunctiva/sclera: Conjunctivae normal.     Pupils: Pupils are equal, round, and reactive to light.  Neck:     Thyroid: No thyromegaly.     Vascular: No JVD.     Trachea: No tracheal deviation.  Cardiovascular:     Rate and Rhythm: Normal rate and regular rhythm.  Chest Wall: PMI is not displaced.     Pulses: Normal pulses.     Heart sounds: Normal heart sounds, S1 normal and S2 normal. No murmur heard.  No systolic murmur is present.  No diastolic murmur is present.  No friction rub. No gallop. No S3 or S4 sounds.   Pulmonary:     Effort: No respiratory distress.     Breath sounds: Normal breath sounds. No wheezing, rhonchi or rales.  Abdominal:     General: Bowel sounds are normal.     Palpations: Abdomen is soft. There is no mass.     Tenderness: There is no abdominal tenderness. There is no guarding or rebound.  Musculoskeletal:        General: No tenderness. Normal range of motion.     Cervical back: Normal range of motion and neck supple.     Right lower leg: No edema.     Left lower leg: No edema.  Lymphadenopathy:     Cervical: No cervical adenopathy.  Skin:    General: Skin is warm.     Findings: No rash.  Neurological:     Mental Status: He is alert and oriented to person, place, and time.     Cranial Nerves: No cranial nerve deficit.     Deep Tendon Reflexes: Reflexes are normal and symmetric.     Wt Readings from Last 3 Encounters:  12/25/19 271 lb (122.9 kg)  09/25/19 278 lb (126.1 kg)  08/22/19 285 lb (129.3 kg)    BP 130/80   Pulse 68   Ht 6\' 3"  (1.905 m)   Wt 271 lb (122.9 kg)   BMI 33.87 kg/m   Assessment and Plan: 1. Hospital discharge  follow-up Patient is status post discharge from rehab from a previous admission and discharge last month at [facility] for a fall sustaining compression fractures in the thoracolumbar area.  Patient is currently followed by orthopedics and is on opiate analgesics for pain control but only takes it once a day.  2. History of anemia Patient with history of anemia which is mild upon last admission.  Will check CBC for current status. - CBC with Differential/Platelet  3. History of hyperkalemia Noted history of hyperkalemia of 6.0 which was corrected.  This is likely due to AKI.  We will check renal function panel for assessment. - Renal Function Panel  4. Orthostatic dizziness Patient's family has noted that he is becoming somewhat dizzy in the morning when he gets up.  Patient has multiple reasons for dehydration to bradycardia to electrolyte concerns.  We will obtain lab work at this time.  In the meantime we have discussed orthostatic dizziness and the necessity to gradually go from a lying to sitting to standing with assistance of a walker available.  5. Thoracolumbar back pain Patient is followed by orthopedics for status post repair of compression fractures of the T11-T12 area.  Patient has an upcoming appointment for which he is being seen at [facility].  6. Pressure injury of sacral region, stage 1 She has a stage I sacral wound which is healing well.  Patient has been using Neosporin along with something else that it was given to him at the rehab center which I have back encouraged him to continue to do so however at this point we will make a formal referral for wound care for review of therapy and verification of current regimen. - AMB referral to wound care center  7. Need for immunization against influenza Discussed and  administered. - Flu Vaccine QUAD High Dose(Fluad)

## 2019-12-26 DIAGNOSIS — Z955 Presence of coronary angioplasty implant and graft: Secondary | ICD-10-CM | POA: Diagnosis not present

## 2019-12-26 DIAGNOSIS — Z79891 Long term (current) use of opiate analgesic: Secondary | ICD-10-CM | POA: Diagnosis not present

## 2019-12-26 DIAGNOSIS — N183 Chronic kidney disease, stage 3 unspecified: Secondary | ICD-10-CM | POA: Diagnosis not present

## 2019-12-26 DIAGNOSIS — I272 Pulmonary hypertension, unspecified: Secondary | ICD-10-CM | POA: Diagnosis not present

## 2019-12-26 DIAGNOSIS — Z7901 Long term (current) use of anticoagulants: Secondary | ICD-10-CM | POA: Diagnosis not present

## 2019-12-26 DIAGNOSIS — I130 Hypertensive heart and chronic kidney disease with heart failure and stage 1 through stage 4 chronic kidney disease, or unspecified chronic kidney disease: Secondary | ICD-10-CM | POA: Diagnosis not present

## 2019-12-26 DIAGNOSIS — S22080D Wedge compression fracture of T11-T12 vertebra, subsequent encounter for fracture with routine healing: Secondary | ICD-10-CM | POA: Diagnosis not present

## 2019-12-26 DIAGNOSIS — Z8673 Personal history of transient ischemic attack (TIA), and cerebral infarction without residual deficits: Secondary | ICD-10-CM | POA: Diagnosis not present

## 2019-12-26 DIAGNOSIS — E785 Hyperlipidemia, unspecified: Secondary | ICD-10-CM | POA: Diagnosis not present

## 2019-12-26 DIAGNOSIS — I5022 Chronic systolic (congestive) heart failure: Secondary | ICD-10-CM | POA: Diagnosis not present

## 2019-12-26 DIAGNOSIS — Z9181 History of falling: Secondary | ICD-10-CM | POA: Diagnosis not present

## 2019-12-26 DIAGNOSIS — I4892 Unspecified atrial flutter: Secondary | ICD-10-CM | POA: Diagnosis not present

## 2019-12-26 DIAGNOSIS — I2510 Atherosclerotic heart disease of native coronary artery without angina pectoris: Secondary | ICD-10-CM | POA: Diagnosis not present

## 2019-12-26 DIAGNOSIS — D631 Anemia in chronic kidney disease: Secondary | ICD-10-CM | POA: Diagnosis not present

## 2019-12-26 DIAGNOSIS — E1122 Type 2 diabetes mellitus with diabetic chronic kidney disease: Secondary | ICD-10-CM | POA: Diagnosis not present

## 2019-12-26 LAB — CBC WITH DIFFERENTIAL/PLATELET
Basophils Absolute: 0.1 10*3/uL (ref 0.0–0.2)
Basos: 1 %
EOS (ABSOLUTE): 0.2 10*3/uL (ref 0.0–0.4)
Eos: 2 %
Hematocrit: 38.4 % (ref 37.5–51.0)
Hemoglobin: 12.8 g/dL — ABNORMAL LOW (ref 13.0–17.7)
Immature Grans (Abs): 0 10*3/uL (ref 0.0–0.1)
Immature Granulocytes: 0 %
Lymphocytes Absolute: 1.8 10*3/uL (ref 0.7–3.1)
Lymphs: 17 %
MCH: 29.8 pg (ref 26.6–33.0)
MCHC: 33.3 g/dL (ref 31.5–35.7)
MCV: 90 fL (ref 79–97)
Monocytes Absolute: 1.4 10*3/uL — ABNORMAL HIGH (ref 0.1–0.9)
Monocytes: 13 %
Neutrophils Absolute: 7.1 10*3/uL — ABNORMAL HIGH (ref 1.4–7.0)
Neutrophils: 67 %
Platelets: 244 10*3/uL (ref 150–450)
RBC: 4.29 x10E6/uL (ref 4.14–5.80)
RDW: 14.6 % (ref 11.6–15.4)
WBC: 10.5 10*3/uL (ref 3.4–10.8)

## 2019-12-26 LAB — RENAL FUNCTION PANEL
Albumin: 3.7 g/dL (ref 3.7–4.7)
BUN/Creatinine Ratio: 16 (ref 10–24)
BUN: 26 mg/dL (ref 8–27)
CO2: 21 mmol/L (ref 20–29)
Calcium: 9 mg/dL (ref 8.6–10.2)
Chloride: 104 mmol/L (ref 96–106)
Creatinine, Ser: 1.6 mg/dL — ABNORMAL HIGH (ref 0.76–1.27)
GFR calc Af Amer: 49 mL/min/{1.73_m2} — ABNORMAL LOW (ref >59)
GFR calc non Af Amer: 42 mL/min/{1.73_m2} — ABNORMAL LOW (ref >59)
Glucose: 84 mg/dL (ref 65–99)
Phosphorus: 3.3 mg/dL (ref 2.8–4.1)
Potassium: 4.4 mmol/L (ref 3.5–5.2)
Sodium: 140 mmol/L (ref 134–144)

## 2019-12-31 DIAGNOSIS — I5022 Chronic systolic (congestive) heart failure: Secondary | ICD-10-CM | POA: Diagnosis not present

## 2019-12-31 DIAGNOSIS — S22080D Wedge compression fracture of T11-T12 vertebra, subsequent encounter for fracture with routine healing: Secondary | ICD-10-CM | POA: Diagnosis not present

## 2019-12-31 DIAGNOSIS — Z79891 Long term (current) use of opiate analgesic: Secondary | ICD-10-CM | POA: Diagnosis not present

## 2019-12-31 DIAGNOSIS — E785 Hyperlipidemia, unspecified: Secondary | ICD-10-CM | POA: Diagnosis not present

## 2019-12-31 DIAGNOSIS — I4892 Unspecified atrial flutter: Secondary | ICD-10-CM | POA: Diagnosis not present

## 2019-12-31 DIAGNOSIS — I272 Pulmonary hypertension, unspecified: Secondary | ICD-10-CM | POA: Diagnosis not present

## 2019-12-31 DIAGNOSIS — I130 Hypertensive heart and chronic kidney disease with heart failure and stage 1 through stage 4 chronic kidney disease, or unspecified chronic kidney disease: Secondary | ICD-10-CM | POA: Diagnosis not present

## 2019-12-31 DIAGNOSIS — E1122 Type 2 diabetes mellitus with diabetic chronic kidney disease: Secondary | ICD-10-CM | POA: Diagnosis not present

## 2019-12-31 DIAGNOSIS — Z7901 Long term (current) use of anticoagulants: Secondary | ICD-10-CM | POA: Diagnosis not present

## 2019-12-31 DIAGNOSIS — I2510 Atherosclerotic heart disease of native coronary artery without angina pectoris: Secondary | ICD-10-CM | POA: Diagnosis not present

## 2019-12-31 DIAGNOSIS — Z8673 Personal history of transient ischemic attack (TIA), and cerebral infarction without residual deficits: Secondary | ICD-10-CM | POA: Diagnosis not present

## 2019-12-31 DIAGNOSIS — Z955 Presence of coronary angioplasty implant and graft: Secondary | ICD-10-CM | POA: Diagnosis not present

## 2019-12-31 DIAGNOSIS — N183 Chronic kidney disease, stage 3 unspecified: Secondary | ICD-10-CM | POA: Diagnosis not present

## 2019-12-31 DIAGNOSIS — Z9181 History of falling: Secondary | ICD-10-CM | POA: Diagnosis not present

## 2019-12-31 DIAGNOSIS — D631 Anemia in chronic kidney disease: Secondary | ICD-10-CM | POA: Diagnosis not present

## 2020-01-02 DIAGNOSIS — I4892 Unspecified atrial flutter: Secondary | ICD-10-CM | POA: Diagnosis not present

## 2020-01-02 DIAGNOSIS — E1122 Type 2 diabetes mellitus with diabetic chronic kidney disease: Secondary | ICD-10-CM | POA: Diagnosis not present

## 2020-01-02 DIAGNOSIS — Z7901 Long term (current) use of anticoagulants: Secondary | ICD-10-CM | POA: Diagnosis not present

## 2020-01-02 DIAGNOSIS — Z955 Presence of coronary angioplasty implant and graft: Secondary | ICD-10-CM | POA: Diagnosis not present

## 2020-01-02 DIAGNOSIS — I272 Pulmonary hypertension, unspecified: Secondary | ICD-10-CM | POA: Diagnosis not present

## 2020-01-02 DIAGNOSIS — Z79891 Long term (current) use of opiate analgesic: Secondary | ICD-10-CM | POA: Diagnosis not present

## 2020-01-02 DIAGNOSIS — I130 Hypertensive heart and chronic kidney disease with heart failure and stage 1 through stage 4 chronic kidney disease, or unspecified chronic kidney disease: Secondary | ICD-10-CM | POA: Diagnosis not present

## 2020-01-02 DIAGNOSIS — Z9181 History of falling: Secondary | ICD-10-CM | POA: Diagnosis not present

## 2020-01-02 DIAGNOSIS — S22080D Wedge compression fracture of T11-T12 vertebra, subsequent encounter for fracture with routine healing: Secondary | ICD-10-CM | POA: Diagnosis not present

## 2020-01-02 DIAGNOSIS — I5022 Chronic systolic (congestive) heart failure: Secondary | ICD-10-CM | POA: Diagnosis not present

## 2020-01-02 DIAGNOSIS — Z8673 Personal history of transient ischemic attack (TIA), and cerebral infarction without residual deficits: Secondary | ICD-10-CM | POA: Diagnosis not present

## 2020-01-02 DIAGNOSIS — D631 Anemia in chronic kidney disease: Secondary | ICD-10-CM | POA: Diagnosis not present

## 2020-01-02 DIAGNOSIS — N183 Chronic kidney disease, stage 3 unspecified: Secondary | ICD-10-CM | POA: Diagnosis not present

## 2020-01-02 DIAGNOSIS — E785 Hyperlipidemia, unspecified: Secondary | ICD-10-CM | POA: Diagnosis not present

## 2020-01-02 DIAGNOSIS — I2510 Atherosclerotic heart disease of native coronary artery without angina pectoris: Secondary | ICD-10-CM | POA: Diagnosis not present

## 2020-01-03 DIAGNOSIS — M5416 Radiculopathy, lumbar region: Secondary | ICD-10-CM | POA: Diagnosis not present

## 2020-01-03 DIAGNOSIS — R262 Difficulty in walking, not elsewhere classified: Secondary | ICD-10-CM | POA: Diagnosis not present

## 2020-01-03 DIAGNOSIS — Z981 Arthrodesis status: Secondary | ICD-10-CM | POA: Diagnosis not present

## 2020-01-07 DIAGNOSIS — S22080D Wedge compression fracture of T11-T12 vertebra, subsequent encounter for fracture with routine healing: Secondary | ICD-10-CM | POA: Diagnosis not present

## 2020-01-07 DIAGNOSIS — Z7901 Long term (current) use of anticoagulants: Secondary | ICD-10-CM | POA: Diagnosis not present

## 2020-01-07 DIAGNOSIS — Z955 Presence of coronary angioplasty implant and graft: Secondary | ICD-10-CM | POA: Diagnosis not present

## 2020-01-07 DIAGNOSIS — Z79891 Long term (current) use of opiate analgesic: Secondary | ICD-10-CM | POA: Diagnosis not present

## 2020-01-07 DIAGNOSIS — E1122 Type 2 diabetes mellitus with diabetic chronic kidney disease: Secondary | ICD-10-CM | POA: Diagnosis not present

## 2020-01-07 DIAGNOSIS — I272 Pulmonary hypertension, unspecified: Secondary | ICD-10-CM | POA: Diagnosis not present

## 2020-01-07 DIAGNOSIS — Z8673 Personal history of transient ischemic attack (TIA), and cerebral infarction without residual deficits: Secondary | ICD-10-CM | POA: Diagnosis not present

## 2020-01-07 DIAGNOSIS — D631 Anemia in chronic kidney disease: Secondary | ICD-10-CM | POA: Diagnosis not present

## 2020-01-07 DIAGNOSIS — I5022 Chronic systolic (congestive) heart failure: Secondary | ICD-10-CM | POA: Diagnosis not present

## 2020-01-07 DIAGNOSIS — N183 Chronic kidney disease, stage 3 unspecified: Secondary | ICD-10-CM | POA: Diagnosis not present

## 2020-01-07 DIAGNOSIS — E785 Hyperlipidemia, unspecified: Secondary | ICD-10-CM | POA: Diagnosis not present

## 2020-01-07 DIAGNOSIS — I4892 Unspecified atrial flutter: Secondary | ICD-10-CM | POA: Diagnosis not present

## 2020-01-07 DIAGNOSIS — I130 Hypertensive heart and chronic kidney disease with heart failure and stage 1 through stage 4 chronic kidney disease, or unspecified chronic kidney disease: Secondary | ICD-10-CM | POA: Diagnosis not present

## 2020-01-07 DIAGNOSIS — Z9181 History of falling: Secondary | ICD-10-CM | POA: Diagnosis not present

## 2020-01-07 DIAGNOSIS — I2510 Atherosclerotic heart disease of native coronary artery without angina pectoris: Secondary | ICD-10-CM | POA: Diagnosis not present

## 2020-01-09 DIAGNOSIS — Z791 Long term (current) use of non-steroidal anti-inflammatories (NSAID): Secondary | ICD-10-CM | POA: Diagnosis not present

## 2020-01-09 DIAGNOSIS — E785 Hyperlipidemia, unspecified: Secondary | ICD-10-CM | POA: Diagnosis not present

## 2020-01-09 DIAGNOSIS — I5022 Chronic systolic (congestive) heart failure: Secondary | ICD-10-CM | POA: Diagnosis not present

## 2020-01-09 DIAGNOSIS — Z7901 Long term (current) use of anticoagulants: Secondary | ICD-10-CM | POA: Diagnosis not present

## 2020-01-09 DIAGNOSIS — M4804 Spinal stenosis, thoracic region: Secondary | ICD-10-CM | POA: Diagnosis not present

## 2020-01-09 DIAGNOSIS — Z87891 Personal history of nicotine dependence: Secondary | ICD-10-CM | POA: Diagnosis not present

## 2020-01-09 DIAGNOSIS — M1990 Unspecified osteoarthritis, unspecified site: Secondary | ICD-10-CM | POA: Diagnosis not present

## 2020-01-09 DIAGNOSIS — Z7984 Long term (current) use of oral hypoglycemic drugs: Secondary | ICD-10-CM | POA: Diagnosis not present

## 2020-01-09 DIAGNOSIS — I130 Hypertensive heart and chronic kidney disease with heart failure and stage 1 through stage 4 chronic kidney disease, or unspecified chronic kidney disease: Secondary | ICD-10-CM | POA: Diagnosis not present

## 2020-01-09 DIAGNOSIS — S22089A Unspecified fracture of T11-T12 vertebra, initial encounter for closed fracture: Secondary | ICD-10-CM | POA: Diagnosis not present

## 2020-01-09 DIAGNOSIS — Z8673 Personal history of transient ischemic attack (TIA), and cerebral infarction without residual deficits: Secondary | ICD-10-CM | POA: Diagnosis not present

## 2020-01-09 DIAGNOSIS — I252 Old myocardial infarction: Secondary | ICD-10-CM | POA: Diagnosis not present

## 2020-01-09 DIAGNOSIS — I2510 Atherosclerotic heart disease of native coronary artery without angina pectoris: Secondary | ICD-10-CM | POA: Diagnosis not present

## 2020-01-09 DIAGNOSIS — N183 Chronic kidney disease, stage 3 unspecified: Secondary | ICD-10-CM | POA: Diagnosis not present

## 2020-01-09 DIAGNOSIS — I483 Typical atrial flutter: Secondary | ICD-10-CM | POA: Diagnosis not present

## 2020-01-09 DIAGNOSIS — Z955 Presence of coronary angioplasty implant and graft: Secondary | ICD-10-CM | POA: Diagnosis not present

## 2020-01-11 DIAGNOSIS — I2510 Atherosclerotic heart disease of native coronary artery without angina pectoris: Secondary | ICD-10-CM | POA: Diagnosis not present

## 2020-01-11 DIAGNOSIS — Z79891 Long term (current) use of opiate analgesic: Secondary | ICD-10-CM | POA: Diagnosis not present

## 2020-01-11 DIAGNOSIS — Z8673 Personal history of transient ischemic attack (TIA), and cerebral infarction without residual deficits: Secondary | ICD-10-CM | POA: Diagnosis not present

## 2020-01-11 DIAGNOSIS — N183 Chronic kidney disease, stage 3 unspecified: Secondary | ICD-10-CM | POA: Diagnosis not present

## 2020-01-11 DIAGNOSIS — Z9181 History of falling: Secondary | ICD-10-CM | POA: Diagnosis not present

## 2020-01-11 DIAGNOSIS — I4892 Unspecified atrial flutter: Secondary | ICD-10-CM | POA: Diagnosis not present

## 2020-01-11 DIAGNOSIS — I272 Pulmonary hypertension, unspecified: Secondary | ICD-10-CM | POA: Diagnosis not present

## 2020-01-11 DIAGNOSIS — E1122 Type 2 diabetes mellitus with diabetic chronic kidney disease: Secondary | ICD-10-CM | POA: Diagnosis not present

## 2020-01-11 DIAGNOSIS — Z7901 Long term (current) use of anticoagulants: Secondary | ICD-10-CM | POA: Diagnosis not present

## 2020-01-11 DIAGNOSIS — I130 Hypertensive heart and chronic kidney disease with heart failure and stage 1 through stage 4 chronic kidney disease, or unspecified chronic kidney disease: Secondary | ICD-10-CM | POA: Diagnosis not present

## 2020-01-11 DIAGNOSIS — D631 Anemia in chronic kidney disease: Secondary | ICD-10-CM | POA: Diagnosis not present

## 2020-01-11 DIAGNOSIS — E785 Hyperlipidemia, unspecified: Secondary | ICD-10-CM | POA: Diagnosis not present

## 2020-01-11 DIAGNOSIS — S22080D Wedge compression fracture of T11-T12 vertebra, subsequent encounter for fracture with routine healing: Secondary | ICD-10-CM | POA: Diagnosis not present

## 2020-01-11 DIAGNOSIS — I5022 Chronic systolic (congestive) heart failure: Secondary | ICD-10-CM | POA: Diagnosis not present

## 2020-01-11 DIAGNOSIS — Z955 Presence of coronary angioplasty implant and graft: Secondary | ICD-10-CM | POA: Diagnosis not present

## 2020-01-14 DIAGNOSIS — I4892 Unspecified atrial flutter: Secondary | ICD-10-CM | POA: Diagnosis not present

## 2020-01-14 DIAGNOSIS — N183 Chronic kidney disease, stage 3 unspecified: Secondary | ICD-10-CM | POA: Diagnosis not present

## 2020-01-14 DIAGNOSIS — I272 Pulmonary hypertension, unspecified: Secondary | ICD-10-CM | POA: Diagnosis not present

## 2020-01-14 DIAGNOSIS — E1122 Type 2 diabetes mellitus with diabetic chronic kidney disease: Secondary | ICD-10-CM | POA: Diagnosis not present

## 2020-01-14 DIAGNOSIS — I2510 Atherosclerotic heart disease of native coronary artery without angina pectoris: Secondary | ICD-10-CM | POA: Diagnosis not present

## 2020-01-14 DIAGNOSIS — I5022 Chronic systolic (congestive) heart failure: Secondary | ICD-10-CM | POA: Diagnosis not present

## 2020-01-14 DIAGNOSIS — Z9181 History of falling: Secondary | ICD-10-CM | POA: Diagnosis not present

## 2020-01-14 DIAGNOSIS — Z8673 Personal history of transient ischemic attack (TIA), and cerebral infarction without residual deficits: Secondary | ICD-10-CM | POA: Diagnosis not present

## 2020-01-14 DIAGNOSIS — Z7901 Long term (current) use of anticoagulants: Secondary | ICD-10-CM | POA: Diagnosis not present

## 2020-01-14 DIAGNOSIS — I130 Hypertensive heart and chronic kidney disease with heart failure and stage 1 through stage 4 chronic kidney disease, or unspecified chronic kidney disease: Secondary | ICD-10-CM | POA: Diagnosis not present

## 2020-01-14 DIAGNOSIS — S22080D Wedge compression fracture of T11-T12 vertebra, subsequent encounter for fracture with routine healing: Secondary | ICD-10-CM | POA: Diagnosis not present

## 2020-01-14 DIAGNOSIS — E785 Hyperlipidemia, unspecified: Secondary | ICD-10-CM | POA: Diagnosis not present

## 2020-01-14 DIAGNOSIS — D631 Anemia in chronic kidney disease: Secondary | ICD-10-CM | POA: Diagnosis not present

## 2020-01-14 DIAGNOSIS — Z955 Presence of coronary angioplasty implant and graft: Secondary | ICD-10-CM | POA: Diagnosis not present

## 2020-01-14 DIAGNOSIS — Z79891 Long term (current) use of opiate analgesic: Secondary | ICD-10-CM | POA: Diagnosis not present

## 2020-01-22 ENCOUNTER — Telehealth: Payer: Self-pay | Admitting: Family Medicine

## 2020-01-22 DIAGNOSIS — I5022 Chronic systolic (congestive) heart failure: Secondary | ICD-10-CM | POA: Diagnosis not present

## 2020-01-22 DIAGNOSIS — I4892 Unspecified atrial flutter: Secondary | ICD-10-CM | POA: Diagnosis not present

## 2020-01-22 DIAGNOSIS — Z7901 Long term (current) use of anticoagulants: Secondary | ICD-10-CM | POA: Diagnosis not present

## 2020-01-22 DIAGNOSIS — S22080D Wedge compression fracture of T11-T12 vertebra, subsequent encounter for fracture with routine healing: Secondary | ICD-10-CM | POA: Diagnosis not present

## 2020-01-22 DIAGNOSIS — E1122 Type 2 diabetes mellitus with diabetic chronic kidney disease: Secondary | ICD-10-CM | POA: Diagnosis not present

## 2020-01-22 DIAGNOSIS — Z9181 History of falling: Secondary | ICD-10-CM | POA: Diagnosis not present

## 2020-01-22 DIAGNOSIS — D631 Anemia in chronic kidney disease: Secondary | ICD-10-CM | POA: Diagnosis not present

## 2020-01-22 DIAGNOSIS — I272 Pulmonary hypertension, unspecified: Secondary | ICD-10-CM | POA: Diagnosis not present

## 2020-01-22 DIAGNOSIS — E785 Hyperlipidemia, unspecified: Secondary | ICD-10-CM | POA: Diagnosis not present

## 2020-01-22 DIAGNOSIS — N183 Chronic kidney disease, stage 3 unspecified: Secondary | ICD-10-CM | POA: Diagnosis not present

## 2020-01-22 DIAGNOSIS — I2510 Atherosclerotic heart disease of native coronary artery without angina pectoris: Secondary | ICD-10-CM | POA: Diagnosis not present

## 2020-01-22 DIAGNOSIS — I130 Hypertensive heart and chronic kidney disease with heart failure and stage 1 through stage 4 chronic kidney disease, or unspecified chronic kidney disease: Secondary | ICD-10-CM | POA: Diagnosis not present

## 2020-01-22 DIAGNOSIS — Z8673 Personal history of transient ischemic attack (TIA), and cerebral infarction without residual deficits: Secondary | ICD-10-CM | POA: Diagnosis not present

## 2020-01-22 DIAGNOSIS — Z79891 Long term (current) use of opiate analgesic: Secondary | ICD-10-CM | POA: Diagnosis not present

## 2020-01-22 DIAGNOSIS — Z955 Presence of coronary angioplasty implant and graft: Secondary | ICD-10-CM | POA: Diagnosis not present

## 2020-01-22 NOTE — Telephone Encounter (Signed)
Called and left message that they may proceed with PT with pt

## 2020-01-22 NOTE — Telephone Encounter (Signed)
Aldrin with [facility] calling for continued home health PT verbal orders  1 wk 4  cb  [phone] Ok to leave vm on her secure line

## 2020-01-29 DIAGNOSIS — S22080D Wedge compression fracture of T11-T12 vertebra, subsequent encounter for fracture with routine healing: Secondary | ICD-10-CM | POA: Diagnosis not present

## 2020-01-29 DIAGNOSIS — I4892 Unspecified atrial flutter: Secondary | ICD-10-CM | POA: Diagnosis not present

## 2020-01-29 DIAGNOSIS — Z7901 Long term (current) use of anticoagulants: Secondary | ICD-10-CM | POA: Diagnosis not present

## 2020-01-29 DIAGNOSIS — I272 Pulmonary hypertension, unspecified: Secondary | ICD-10-CM | POA: Diagnosis not present

## 2020-01-29 DIAGNOSIS — E1122 Type 2 diabetes mellitus with diabetic chronic kidney disease: Secondary | ICD-10-CM | POA: Diagnosis not present

## 2020-01-29 DIAGNOSIS — I130 Hypertensive heart and chronic kidney disease with heart failure and stage 1 through stage 4 chronic kidney disease, or unspecified chronic kidney disease: Secondary | ICD-10-CM | POA: Diagnosis not present

## 2020-01-29 DIAGNOSIS — I2510 Atherosclerotic heart disease of native coronary artery without angina pectoris: Secondary | ICD-10-CM | POA: Diagnosis not present

## 2020-01-29 DIAGNOSIS — Z955 Presence of coronary angioplasty implant and graft: Secondary | ICD-10-CM | POA: Diagnosis not present

## 2020-01-29 DIAGNOSIS — E785 Hyperlipidemia, unspecified: Secondary | ICD-10-CM | POA: Diagnosis not present

## 2020-01-29 DIAGNOSIS — Z79891 Long term (current) use of opiate analgesic: Secondary | ICD-10-CM | POA: Diagnosis not present

## 2020-01-29 DIAGNOSIS — N183 Chronic kidney disease, stage 3 unspecified: Secondary | ICD-10-CM | POA: Diagnosis not present

## 2020-01-29 DIAGNOSIS — D631 Anemia in chronic kidney disease: Secondary | ICD-10-CM | POA: Diagnosis not present

## 2020-01-29 DIAGNOSIS — I5022 Chronic systolic (congestive) heart failure: Secondary | ICD-10-CM | POA: Diagnosis not present

## 2020-01-29 DIAGNOSIS — Z9181 History of falling: Secondary | ICD-10-CM | POA: Diagnosis not present

## 2020-01-29 DIAGNOSIS — Z8673 Personal history of transient ischemic attack (TIA), and cerebral infarction without residual deficits: Secondary | ICD-10-CM | POA: Diagnosis not present

## 2020-01-31 DIAGNOSIS — I2510 Atherosclerotic heart disease of native coronary artery without angina pectoris: Secondary | ICD-10-CM | POA: Diagnosis not present

## 2020-01-31 DIAGNOSIS — Z9181 History of falling: Secondary | ICD-10-CM | POA: Diagnosis not present

## 2020-01-31 DIAGNOSIS — N183 Chronic kidney disease, stage 3 unspecified: Secondary | ICD-10-CM | POA: Diagnosis not present

## 2020-01-31 DIAGNOSIS — Z79891 Long term (current) use of opiate analgesic: Secondary | ICD-10-CM | POA: Diagnosis not present

## 2020-01-31 DIAGNOSIS — E785 Hyperlipidemia, unspecified: Secondary | ICD-10-CM | POA: Diagnosis not present

## 2020-01-31 DIAGNOSIS — S22080D Wedge compression fracture of T11-T12 vertebra, subsequent encounter for fracture with routine healing: Secondary | ICD-10-CM | POA: Diagnosis not present

## 2020-01-31 DIAGNOSIS — Z8673 Personal history of transient ischemic attack (TIA), and cerebral infarction without residual deficits: Secondary | ICD-10-CM | POA: Diagnosis not present

## 2020-01-31 DIAGNOSIS — I5022 Chronic systolic (congestive) heart failure: Secondary | ICD-10-CM | POA: Diagnosis not present

## 2020-01-31 DIAGNOSIS — E1122 Type 2 diabetes mellitus with diabetic chronic kidney disease: Secondary | ICD-10-CM | POA: Diagnosis not present

## 2020-01-31 DIAGNOSIS — D631 Anemia in chronic kidney disease: Secondary | ICD-10-CM | POA: Diagnosis not present

## 2020-01-31 DIAGNOSIS — I4892 Unspecified atrial flutter: Secondary | ICD-10-CM | POA: Diagnosis not present

## 2020-01-31 DIAGNOSIS — Z7901 Long term (current) use of anticoagulants: Secondary | ICD-10-CM | POA: Diagnosis not present

## 2020-01-31 DIAGNOSIS — I130 Hypertensive heart and chronic kidney disease with heart failure and stage 1 through stage 4 chronic kidney disease, or unspecified chronic kidney disease: Secondary | ICD-10-CM | POA: Diagnosis not present

## 2020-01-31 DIAGNOSIS — I272 Pulmonary hypertension, unspecified: Secondary | ICD-10-CM | POA: Diagnosis not present

## 2020-01-31 DIAGNOSIS — Z955 Presence of coronary angioplasty implant and graft: Secondary | ICD-10-CM | POA: Diagnosis not present

## 2020-02-04 DIAGNOSIS — I272 Pulmonary hypertension, unspecified: Secondary | ICD-10-CM | POA: Diagnosis not present

## 2020-02-04 DIAGNOSIS — N183 Chronic kidney disease, stage 3 unspecified: Secondary | ICD-10-CM | POA: Diagnosis not present

## 2020-02-04 DIAGNOSIS — Z9181 History of falling: Secondary | ICD-10-CM | POA: Diagnosis not present

## 2020-02-04 DIAGNOSIS — E785 Hyperlipidemia, unspecified: Secondary | ICD-10-CM | POA: Diagnosis not present

## 2020-02-04 DIAGNOSIS — E1122 Type 2 diabetes mellitus with diabetic chronic kidney disease: Secondary | ICD-10-CM | POA: Diagnosis not present

## 2020-02-04 DIAGNOSIS — I5022 Chronic systolic (congestive) heart failure: Secondary | ICD-10-CM | POA: Diagnosis not present

## 2020-02-04 DIAGNOSIS — S22080D Wedge compression fracture of T11-T12 vertebra, subsequent encounter for fracture with routine healing: Secondary | ICD-10-CM | POA: Diagnosis not present

## 2020-02-04 DIAGNOSIS — I130 Hypertensive heart and chronic kidney disease with heart failure and stage 1 through stage 4 chronic kidney disease, or unspecified chronic kidney disease: Secondary | ICD-10-CM | POA: Diagnosis not present

## 2020-02-04 DIAGNOSIS — Z955 Presence of coronary angioplasty implant and graft: Secondary | ICD-10-CM | POA: Diagnosis not present

## 2020-02-04 DIAGNOSIS — Z7901 Long term (current) use of anticoagulants: Secondary | ICD-10-CM | POA: Diagnosis not present

## 2020-02-04 DIAGNOSIS — Z8673 Personal history of transient ischemic attack (TIA), and cerebral infarction without residual deficits: Secondary | ICD-10-CM | POA: Diagnosis not present

## 2020-02-04 DIAGNOSIS — I2510 Atherosclerotic heart disease of native coronary artery without angina pectoris: Secondary | ICD-10-CM | POA: Diagnosis not present

## 2020-02-04 DIAGNOSIS — Z79891 Long term (current) use of opiate analgesic: Secondary | ICD-10-CM | POA: Diagnosis not present

## 2020-02-04 DIAGNOSIS — D631 Anemia in chronic kidney disease: Secondary | ICD-10-CM | POA: Diagnosis not present

## 2020-02-04 DIAGNOSIS — I4892 Unspecified atrial flutter: Secondary | ICD-10-CM | POA: Diagnosis not present

## 2020-02-15 DIAGNOSIS — Z8673 Personal history of transient ischemic attack (TIA), and cerebral infarction without residual deficits: Secondary | ICD-10-CM | POA: Diagnosis not present

## 2020-02-15 DIAGNOSIS — D631 Anemia in chronic kidney disease: Secondary | ICD-10-CM | POA: Diagnosis not present

## 2020-02-15 DIAGNOSIS — N183 Chronic kidney disease, stage 3 unspecified: Secondary | ICD-10-CM | POA: Diagnosis not present

## 2020-02-15 DIAGNOSIS — S22080D Wedge compression fracture of T11-T12 vertebra, subsequent encounter for fracture with routine healing: Secondary | ICD-10-CM | POA: Diagnosis not present

## 2020-02-15 DIAGNOSIS — Z79891 Long term (current) use of opiate analgesic: Secondary | ICD-10-CM | POA: Diagnosis not present

## 2020-02-15 DIAGNOSIS — I4892 Unspecified atrial flutter: Secondary | ICD-10-CM | POA: Diagnosis not present

## 2020-02-15 DIAGNOSIS — Z9181 History of falling: Secondary | ICD-10-CM | POA: Diagnosis not present

## 2020-02-15 DIAGNOSIS — I5022 Chronic systolic (congestive) heart failure: Secondary | ICD-10-CM | POA: Diagnosis not present

## 2020-02-15 DIAGNOSIS — I2510 Atherosclerotic heart disease of native coronary artery without angina pectoris: Secondary | ICD-10-CM | POA: Diagnosis not present

## 2020-02-15 DIAGNOSIS — I130 Hypertensive heart and chronic kidney disease with heart failure and stage 1 through stage 4 chronic kidney disease, or unspecified chronic kidney disease: Secondary | ICD-10-CM | POA: Diagnosis not present

## 2020-02-15 DIAGNOSIS — Z7901 Long term (current) use of anticoagulants: Secondary | ICD-10-CM | POA: Diagnosis not present

## 2020-02-15 DIAGNOSIS — E785 Hyperlipidemia, unspecified: Secondary | ICD-10-CM | POA: Diagnosis not present

## 2020-02-15 DIAGNOSIS — Z955 Presence of coronary angioplasty implant and graft: Secondary | ICD-10-CM | POA: Diagnosis not present

## 2020-02-15 DIAGNOSIS — E1122 Type 2 diabetes mellitus with diabetic chronic kidney disease: Secondary | ICD-10-CM | POA: Diagnosis not present

## 2020-02-15 DIAGNOSIS — I272 Pulmonary hypertension, unspecified: Secondary | ICD-10-CM | POA: Diagnosis not present

## 2020-02-22 ENCOUNTER — Encounter: Payer: Self-pay | Admitting: Family Medicine

## 2020-02-22 ENCOUNTER — Ambulatory Visit (INDEPENDENT_AMBULATORY_CARE_PROVIDER_SITE_OTHER): Payer: Medicare Other | Admitting: Family Medicine

## 2020-02-22 ENCOUNTER — Other Ambulatory Visit: Payer: Self-pay

## 2020-02-22 VITALS — BP 110/62 | HR 72 | Ht 75.0 in | Wt 256.0 lb

## 2020-02-22 DIAGNOSIS — E7849 Other hyperlipidemia: Secondary | ICD-10-CM | POA: Diagnosis not present

## 2020-02-22 DIAGNOSIS — E790 Hyperuricemia without signs of inflammatory arthritis and tophaceous disease: Secondary | ICD-10-CM

## 2020-02-22 MED ORDER — ATORVASTATIN CALCIUM 80 MG PO TABS: 80.0000 mg | ORAL_TABLET | Freq: Every day | ORAL | 1 refills | Status: DC

## 2020-02-22 MED ORDER — ALLOPURINOL 100 MG PO TABS: ORAL_TABLET | ORAL | 1 refills | Status: DC

## 2020-02-22 NOTE — Progress Notes (Signed)
Date:  02/22/2020   Name:  Christopher [name]   DOB:  1945-11-01   MRN:  [id number]   Chief Complaint: Hyperlipidemia and Gout  Hyperlipidemia This is a chronic problem. The current episode started more than 1 year ago. The problem is controlled. Recent lipid tests were reviewed and are normal. He has no history of chronic renal disease, diabetes, hypothyroidism, liver disease, obesity or nephrotic syndrome. There are no known factors aggravating his hyperlipidemia. Pertinent negatives include no chest pain, focal sensory loss, focal weakness, leg pain, myalgias or shortness of breath. Current antihyperlipidemic treatment includes statins. The current treatment provides moderate improvement of lipids. There are no compliance problems.  Risk factors for coronary artery disease include dyslipidemia, hypertension and male sex.    Lab Results  Component Value Date   CREATININE 1.60 (H) 12/25/2019   BUN 26 12/25/2019   NA 140 12/25/2019   K 4.4 12/25/2019   CL 104 12/25/2019   CO2 21 12/25/2019   Lab Results  Component Value Date   CHOL 104 01/24/2018   HDL 21 (L) 01/24/2018   LDLCALC 61 01/24/2018   TRIG 112 01/24/2018   CHOLHDL 5.0 01/24/2018   Lab Results  Component Value Date   TSH 3.423 06/12/2014   No results found for: HGBA1C Lab Results  Component Value Date   WBC 10.5 12/25/2019   HGB 12.8 (L) 12/25/2019   HCT 38.4 12/25/2019   MCV 90 12/25/2019   PLT 244 12/25/2019   Lab Results  Component Value Date   ALT 18 12/16/2016   AST 18 12/16/2016   ALKPHOS 96 12/16/2016   BILITOT 0.5 12/16/2016     Review of Systems  Constitutional: Negative for chills and fever.  HENT: Negative for drooling, ear discharge, ear pain and sore throat.   Respiratory: Negative for cough, shortness of breath and wheezing.   Cardiovascular: Negative for chest pain, palpitations and leg swelling.  Gastrointestinal: Negative for abdominal pain, blood in stool, constipation, diarrhea  and nausea.  Endocrine: Negative for polydipsia.  Genitourinary: Negative for dysuria, frequency, hematuria and urgency.  Musculoskeletal: Negative for back pain, myalgias and neck pain.  Skin: Negative for rash.  Allergic/Immunologic: Negative for environmental allergies.  Neurological: Negative for dizziness, focal weakness and headaches.  Hematological: Does not bruise/bleed easily.  Psychiatric/Behavioral: Negative for suicidal ideas. The patient is not nervous/anxious.     Patient Active Problem List   Diagnosis Date Noted  . Family history of colon cancer   . Polyp of colon   . Ischemic cardiomyopathy 12/19/2018  . Acute renal failure superimposed on stage 3 chronic kidney disease ([place]) 10/14/2018  . Atrial flutter ([place]) 10/14/2018  . Chronic systolic heart failure ([place]) 10/14/2018  . History of TIA (transient ischemic attack) 10/14/2018  . Volume overload 10/14/2018  . Cerebral vascular disease 09/08/2018  . TIA (transient ischemic attack) 12/12/2017  . 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 Encounter for general adult medical examination without abnormal findings 10/22/2014  . Disorder of kidney and ureter 10/22/2014  . Renal colic 63/87/5643  . 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 Creatinine elevation 10/22/2014  . Essential (primary) hypertension 10/22/2014  . H/O hypercholesterolemia 10/22/2014  . Dysmetabolic syndrome 32/95/1884  . H/O acute myocardial infarction 10/22/2014  . Adiposity 10/22/2014  . Impaired renal function 10/22/2014  . Need for vaccination 10/22/2014  . 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 S/P coronary artery stent placement 04/09/2014  . Primary osteoarthritis of right  knee 03/26/2014  . Chronic kidney disease, stage [name] (moderate) ([facility]) 01/25/2011    No Known Allergies  Past Surgical History:  Procedure Laterality Date  . CARDIAC CATHETERIZATION  2000   stent   . COLONOSCOPY  2011  .  COLONOSCOPY WITH PROPOFOL N/A 09/25/2019   Procedure: COLONOSCOPY WITH PROPOFOL;  Surgeon: Virgel Manifold, MD;  Location: [facility];  Service: Endoscopy;  Laterality: N/A;  priority 4  . JOINT REPLACEMENT Right    knee  . KNEE ARTHROSCOPY Right 2016  . POLYPECTOMY N/A 09/25/2019   Procedure: POLYPECTOMY;  Surgeon: Virgel Manifold, MD;  Location: [facility];  Service: Endoscopy;  Laterality: N/A;    Social History   Tobacco Use  . Smoking status: Former Smoker    Types: Cigarettes    Quit date: 1982    Years since quitting: 39.9  . Smokeless tobacco: Never Used  Vaping Use  . Vaping Use: Never used  Substance Use Topics  . Alcohol use: No    Alcohol/week: 0.0 standard drinks  . Drug use: No     Medication list has been reviewed and updated.  Current Meds  Medication Sig  . allopurinol (ZYLOPRIM) 100 MG tablet TAKE 1 TABLET(100 MG) BY MOUTH DAILY  . apixaban (ELIQUIS) 5 MG TABS tablet Take 1 tablet (5 mg total) by mouth 2 (two) times daily. cardio  . atorvastatin (LIPITOR) 80 MG tablet Take 1 tablet (80 mg total) by mouth daily. cardio  . dapagliflozin propanediol (FARXIGA) 10 MG TABS tablet Take 1 tablet by mouth daily. [facility]  . diclofenac sodium (VOLTAREN) 1 % GEL Apply 4 g topically 4 (four) times daily.  Marland Kitchen ENTRESTO 97-103 MG Take 1 tablet by mouth 2 (two) times daily.  . metoprolol (TOPROL-XL) 200 MG 24 hr tablet Take 1 tablet by mouth daily. cardio  . SODIUM BICARBONATE PO Take by mouth 2 (two) times daily.  Marland Kitchen spironolactone (ALDACTONE) 25 MG tablet Take 1 tablet by mouth daily. cardio  . torsemide (DEMADEX) 20 MG tablet Take 20 mg by mouth daily.  . [DISCONTINUED] amiodarone (PACERONE) 200 MG tablet Take 200 mg by mouth daily.    PHQ 2/9 Scores 02/22/2020 12/25/2019 08/22/2019 04/02/2019  PHQ - 2 Score 0 0 0 0  PHQ- 9 Score 0 0 0 -    GAD 7 : Generalized Anxiety Score 02/22/2020 12/25/2019 08/22/2019 03/13/2019  Nervous, Anxious, on Edge 0 0 0  0  Control/stop worrying 0 0 0 0  Worry too much - different things 0 0 0 0  Trouble relaxing 0 0 0 0  Restless 0 0 0 0  Easily annoyed or irritable 0 0 0 0  Afraid - awful might happen 0 0 0 0  Total GAD 7 Score 0 0 0 0    BP Readings from Last 3 Encounters:  02/22/20 110/62  12/25/19 130/80  09/25/19 119/78    Physical Exam Vitals and nursing note reviewed.  HENT:     Head: Normocephalic.     Right Ear: Tympanic membrane, ear canal and external ear normal. There is no impacted cerumen.     Left Ear: Tympanic membrane, ear canal and external ear normal. There is no impacted cerumen.     Nose: Nose normal. No congestion or rhinorrhea.     Mouth/Throat:     Mouth: Oropharynx is clear and moist. Mucous membranes are moist.  Eyes:     General: No scleral icterus.       Right eye:  No discharge.        Left eye: No discharge.     Extraocular Movements: EOM normal.     Conjunctiva/sclera: Conjunctivae normal.     Pupils: Pupils are equal, round, and reactive to light.  Neck:     Thyroid: No thyromegaly.     Vascular: No JVD.     Trachea: No tracheal deviation.  Cardiovascular:     Rate and Rhythm: Normal rate and regular rhythm.     Pulses: Intact distal pulses.     Heart sounds: Normal heart sounds. No murmur heard. No friction rub. No gallop.   Pulmonary:     Effort: No respiratory distress.     Breath sounds: Normal breath sounds. No wheezing, rhonchi or rales.  Abdominal:     General: Bowel sounds are normal.     Palpations: Abdomen is soft. There is no hepatosplenomegaly or mass.     Tenderness: There is no abdominal tenderness. There is no CVA tenderness, guarding or rebound.  Musculoskeletal:        General: No tenderness or edema. Normal range of motion.     Cervical back: Normal range of motion and neck supple.  Lymphadenopathy:     Cervical: No cervical adenopathy.  Skin:    General: Skin is warm.     Findings: No rash.  Neurological:     Mental Status: He  is alert and oriented to person, place, and time.     Cranial Nerves: No cranial nerve deficit.     Deep Tendon Reflexes: Strength normal and reflexes are normal and symmetric.     Wt Readings from Last 3 Encounters:  02/22/20 256 lb (116.1 kg)  12/25/19 271 lb (122.9 kg)  09/25/19 278 lb (126.1 kg)    BP 110/62   Pulse 72   Ht 6\' 3"  (1.905 m)   Wt 256 lb (116.1 kg)   BMI 32.00 kg/m   Assessment and Plan:  1. Familial multiple lipoprotein-type hyperlipidemia Chronic.  Controlled.  Stable.  Continue atorvastatin 80 mg once a day.  We will check lipid panel for current status. - Lipid Panel With LDL/HDL Ratio - atorvastatin (LIPITOR) 80 MG tablet; Take 1 tablet (80 mg total) by mouth daily. cardio  Dispense: 90 tablet; Refill: 1  2. Hyperuricemia Chronic.  Controlled.  Stable.  There has been no acute episodes of gout.  We will continue allopurinol at 100 mg once a day. - allopurinol (ZYLOPRIM) 100 MG tablet; TAKE 1 TABLET(100 MG) BY MOUTH DAILY  Dispense: 90 tablet; Refill: 1

## 2020-02-23 LAB — LIPID PANEL WITH LDL/HDL RATIO
Cholesterol, Total: 101 mg/dL (ref 100–199)
HDL: 26 mg/dL — ABNORMAL LOW (ref >39)
LDL Chol Calc (NIH): 53 mg/dL (ref 0–99)
LDL/HDL Ratio: 2 ratio (ref 0.0–3.6)
Triglycerides: 117 mg/dL (ref 0–149)
VLDL Cholesterol Cal: 22 mg/dL (ref 5–40)

## 2020-03-10 ENCOUNTER — Telehealth: Payer: Self-pay | Admitting: Family Medicine

## 2020-03-10 ENCOUNTER — Other Ambulatory Visit: Payer: Self-pay

## 2020-03-10 MED ORDER — SERTRALINE HCL 25 MG PO TABS
25.0000 mg | ORAL_TABLET | Freq: Every day | ORAL | 0 refills | Status: DC
Start: 2020-03-10 — End: 2020-04-06

## 2020-03-10 NOTE — Telephone Encounter (Unsigned)
Copied from [place] [id number]. Topic: General - Other >> Mar 10, 2020 11:24 AM Celene Kras wrote: Reason for CRM: Pt called and is requesting to speak with Baxter Flattery regarding his depression. He states that he had back surgery and has been stuck at home or four months. He is requesting to have something sent in for him. Please advise.     [id number] [address], [place] AT [facility] OF [address] [place] [address] Phone: [phone] Fax: [phone] Hours: Not open 24 hours

## 2020-03-10 NOTE — Telephone Encounter (Signed)
Called and spoke with patient. Dr Ronnald Ramp wants to start patient on Zoloft 25mg  daily. Will follow up in 3 weeks for depression. Patient verbalized understanding of this.

## 2020-03-31 ENCOUNTER — Ambulatory Visit: Payer: Self-pay | Admitting: Family Medicine

## 2020-04-02 ENCOUNTER — Ambulatory Visit (INDEPENDENT_AMBULATORY_CARE_PROVIDER_SITE_OTHER): Payer: Medicare Other

## 2020-04-02 DIAGNOSIS — Z0000 Encounter for general adult medical examination without abnormal findings: Secondary | ICD-10-CM | POA: Diagnosis not present

## 2020-04-02 NOTE — Patient Instructions (Signed)
Christopher [name] , Thank you for taking time to come for your Medicare Wellness Visit. I appreciate your ongoing commitment to your health goals. Please review the following plan we discussed and let me know if I can assist you in the future.   Screening recommendations/referrals: Colonoscopy: done 09/25/19. Please contact [facility] at [phone] to follow up for repeat screening colonoscopy.  Recommended yearly ophthalmology/optometry visit for glaucoma screening and checkup Recommended yearly dental visit for hygiene and checkup  Vaccinations: Influenza vaccine: done 12/25/19 Pneumococcal vaccine: done 12/16/16 Tdap vaccine: done 02/18/16 Shingles vaccine: done 09/30/17 7 12/16/17   Covid-19: done 04/26/19 & 05/17/19  Advanced directives: Please bring a copy of your health care power of attorney and living will to the office at your convenience.  Conditions/risks identified: Keep up the great work!  Next appointment: Follow up in one year for your annual wellness visit.   Preventive Care 1 Years and Older, Male Preventive care refers to lifestyle choices and visits with your health care provider that can promote health and wellness. What does preventive care include?  A yearly physical exam. This is also called an annual well check.  Dental exams once or twice a year.  Routine eye exams. Ask your health care provider how often you should have your eyes checked.  Personal lifestyle choices, including:  Daily care of your teeth and gums.  Regular physical activity.  Eating a healthy diet.  Avoiding tobacco and drug use.  Limiting alcohol use.  Practicing safe sex.  Taking low doses of aspirin every day.  Taking vitamin and mineral supplements as recommended by your health care provider. What happens during an annual well check? The services and screenings done by your health care provider during your annual well check will depend on your age, overall health,  lifestyle risk factors, and family history of disease. Counseling  Your health care provider may ask you questions about your:  Alcohol use.  Tobacco use.  Drug use.  Emotional well-being.  Home and relationship well-being.  Sexual activity.  Eating habits.  History of falls.  Memory and ability to understand (cognition).  Work and work Statistician. Screening  You may have the following tests or measurements:  Height, weight, and BMI.  Blood pressure.  Lipid and cholesterol levels. These may be checked every 5 years, or more frequently if you are over 54 years old.  Skin check.  Lung cancer screening. You may have this screening every year starting at age 27 if you have a 30-pack-year history of smoking and currently smoke or have quit within the past 15 years.  Fecal occult blood test (FOBT) of the stool. You may have this test every year starting at age 78.  Flexible sigmoidoscopy or colonoscopy. You may have a sigmoidoscopy every 5 years or a colonoscopy every 10 years starting at age 16.  Prostate cancer screening. Recommendations will vary depending on your family history and other risks.  Hepatitis C blood test.  Hepatitis B blood test.  Sexually transmitted disease (STD) testing.  Diabetes screening. This is done by checking your blood sugar (glucose) after you have not eaten for a while (fasting). You may have this done every 1-3 years.  Abdominal aortic aneurysm (AAA) screening. You may need this if you are a current or former smoker.  Osteoporosis. You may be screened starting at age 10 if you are at high risk. Talk with your health care provider about your test results, treatment options, and if necessary, the need  for more tests. Vaccines  Your health care provider may recommend certain vaccines, such as:  Influenza vaccine. This is recommended every year.  Tetanus, diphtheria, and acellular pertussis (Tdap, Td) vaccine. You may need a Td booster  every 10 years.  Zoster vaccine. You may need this after age 37.  Pneumococcal 13-valent conjugate (PCV13) vaccine. One dose is recommended after age 74.  Pneumococcal polysaccharide (PPSV23) vaccine. One dose is recommended after age 71. Talk to your health care provider about which screenings and vaccines you need and how often you need them. This information is not intended to replace advice given to you by your health care provider. Make sure you discuss any questions you have with your health care provider. Document Released: 03/28/2015 Document Revised: 11/19/2015 Document Reviewed: 12/31/2014 Elsevier Interactive Patient Education  2017 [place] Prevention in the Home Falls can cause injuries. They can happen to people of all ages. There are many things you can do to make your home safe and to help prevent falls. What can I do on the outside of my home?  Regularly fix the edges of walkways and driveways and fix any cracks.  Remove anything that might make you trip as you walk through a door, such as a raised step or threshold.  Trim any bushes or trees on the path to your home.  Use bright outdoor lighting.  Clear any walking paths of anything that might make someone trip, such as rocks or tools.  Regularly check to see if handrails are loose or broken. Make sure that both sides of any steps have handrails.  Any raised decks and porches should have guardrails on the edges.  Have any leaves, snow, or ice cleared regularly.  Use sand or salt on walking paths during winter.  Clean up any spills in your garage right away. This includes oil or grease spills. What can I do in the bathroom?  Use night lights.  Install grab bars by the toilet and in the tub and shower. Do not use towel bars as grab bars.  Use non-skid mats or decals in the tub or shower.  If you need to sit down in the shower, use a plastic, non-slip stool.  Keep the floor dry. Clean up any  water that spills on the floor as soon as it happens.  Remove soap buildup in the tub or shower regularly.  Attach bath mats securely with double-sided non-slip rug tape.  Do not have throw rugs and other things on the floor that can make you trip. What can I do in the bedroom?  Use night lights.  Make sure that you have a light by your bed that is easy to reach.  Do not use any sheets or blankets that are too big for your bed. They should not hang down onto the floor.  Have a firm chair that has side arms. You can use this for support while you get dressed.  Do not have throw rugs and other things on the floor that can make you trip. What can I do in the kitchen?  Clean up any spills right away.  Avoid walking on wet floors.  Keep items that you use a lot in easy-to-reach places.  If you need to reach something above you, use a strong step stool that has a grab bar.  Keep electrical cords out of the way.  Do not use floor polish or wax that makes floors slippery. If you must use wax, use  non-skid floor wax.  Do not have throw rugs and other things on the floor that can make you trip. What can I do with my stairs?  Do not leave any items on the stairs.  Make sure that there are handrails on both sides of the stairs and use them. Fix handrails that are broken or loose. Make sure that handrails are as long as the stairways.  Check any carpeting to make sure that it is firmly attached to the stairs. Fix any carpet that is loose or worn.  Avoid having throw rugs at the top or bottom of the stairs. If you do have throw rugs, attach them to the floor with carpet tape.  Make sure that you have a light switch at the top of the stairs and the bottom of the stairs. If you do not have them, ask someone to add them for you. What else can I do to help prevent falls?  Wear shoes that:  Do not have high heels.  Have rubber bottoms.  Are comfortable and fit you well.  Are closed  at the toe. Do not wear sandals.  If you use a stepladder:  Make sure that it is fully opened. Do not climb a closed stepladder.  Make sure that both sides of the stepladder are locked into place.  Ask someone to hold it for you, if possible.  Clearly mark and make sure that you can see:  Any grab bars or handrails.  First and last steps.  Where the edge of each step is.  Use tools that help you move around (mobility aids) if they are needed. These include:  Canes.  Walkers.  Scooters.  Crutches.  Turn on the lights when you go into a dark area. Replace any light bulbs as soon as they burn out.  Set up your furniture so you have a clear path. Avoid moving your furniture around.  If any of your floors are uneven, fix them.  If there are any pets around you, be aware of where they are.  Review your medicines with your doctor. Some medicines can make you feel dizzy. This can increase your chance of falling. Ask your doctor what other things that you can do to help prevent falls. This information is not intended to replace advice given to you by your health care provider. Make sure you discuss any questions you have with your health care provider. Document Released: 12/26/2008 Document Revised: 08/07/2015 Document Reviewed: 04/05/2014 Elsevier Interactive Patient Education  2017 [employer].

## 2020-04-02 NOTE — Progress Notes (Signed)
Subjective:   Christopher [name] is a 75 y.o. male who presents for Medicare Annual/Subsequent preventive examination.  Virtual Visit via Telephone Note  I connected with  Christopher [name] on 04/02/20 at  2:40 PM EST by telephone and verified that I am speaking with the correct person using two identifiers.  Location: Patient: home Provider: [facility] Persons participating in the virtual visit: [place]   I discussed the limitations, risks, security and privacy concerns of performing an evaluation and management service by telephone and the availability of in person appointments. The patient expressed understanding and agreed to proceed.  Interactive audio and video telecommunications were attempted between this nurse and patient, however failed, due to patient having technical difficulties OR patient did not have access to video capability.  We continued and completed visit with audio only.  Some vital signs may be absent or patient reported.   Clemetine Marker, LPN    Review of Systems     Cardiac Risk Factors include: advanced age (>24men, >75 women);dyslipidemia;male gender;hypertension;obesity (BMI >30kg/m2)     Objective:    There were no vitals filed for this visit. There is no height or weight on file to calculate BMI.  Advanced Directives 04/02/2020 09/25/2019 04/02/2019 12/12/2017 06/19/2017 10/22/2014  Does Patient Have a Medical Advance Directive? Yes Yes Yes Yes Yes Yes  Type of Paramedic of [place];Living will [place];Living will [place];Living will [place] will  Does patient want to make changes to medical advance directive? - No - Patient declined - No - Patient declined - -  Copy of [place] in Chart? No - copy requested No - copy requested No - copy requested No - copy requested - No - copy requested     Current Medications (verified) Outpatient Encounter Medications as of 04/02/2020  Medication Sig  . allopurinol (ZYLOPRIM) 100 MG tablet TAKE 1 TABLET(100 MG) BY MOUTH DAILY  . dapagliflozin propanediol (FARXIGA) 10 MG TABS tablet Take 1 tablet by mouth daily. [facility]  . diclofenac sodium (VOLTAREN) 1 % GEL Apply 4 g topically 4 (four) times daily.  Marland Kitchen ENTRESTO 97-103 MG Take 1 tablet by mouth 2 (two) times daily.  . metoprolol (TOPROL-XL) 200 MG 24 hr tablet Take 1 tablet by mouth daily. cardio  . sertraline (ZOLOFT) 25 MG tablet Take 1 tablet (25 mg total) by mouth daily.  . sodium bicarbonate 650 MG tablet Take 650 mg by mouth 2 (two) times daily.  Marland Kitchen spironolactone (ALDACTONE) 25 MG tablet Take 1 tablet by mouth daily. cardio  . torsemide (DEMADEX) 20 MG tablet Take 20 mg by mouth daily.  Marland Kitchen apixaban (ELIQUIS) 5 MG TABS tablet Take 1 tablet (5 mg total) by mouth 2 (two) times daily. cardio  . atorvastatin (LIPITOR) 80 MG tablet Take 1 tablet (80 mg total) by mouth daily. cardio  . [DISCONTINUED] mupirocin ointment (BACTROBAN) 2 % Apply 1 application topically 2 (two) times daily.  . [DISCONTINUED] SODIUM BICARBONATE PO Take by mouth 2 (two) times daily.   No facility-administered encounter medications on file as of 04/02/2020.    Allergies (verified) Patient has no known allergies.   History: Past Medical History:  Diagnosis Date  . Arthritis    ankles, hands, left knee  . CHF (congestive heart failure) ([place])   . Chronic kidney disease (CKD), stage [name] (moderate) ([facility])   . Coronary artery disease   .  Gout   . Hyperlipidemia   . Hypertension   . Myocardial infarction ([place]) 2000  . TIA (transient ischemic attack) 2003  . Wears dentures    full upper   Past Surgical History:  Procedure Laterality Date  . CARDIAC CATHETERIZATION  2000   stent   . COLONOSCOPY  2011  . COLONOSCOPY WITH PROPOFOL N/A 09/25/2019   Procedure: COLONOSCOPY WITH PROPOFOL;  Surgeon: Virgel Manifold, MD;  Location: [place];  Service: Endoscopy;  Laterality: N/A;  priority 4  . JOINT REPLACEMENT Right    knee  . KNEE ARTHROSCOPY Right 2016  . POLYPECTOMY N/A 09/25/2019   Procedure: POLYPECTOMY;  Surgeon: Virgel Manifold, MD;  Location: [place];  Service: Endoscopy;  Laterality: N/A;   Family History  Problem Relation Age of Onset  . Cancer Father   . Heart disease Father   . Diabetes Paternal Grandmother    Social History   Socioeconomic History  . Marital status: Widowed    Spouse name: Not on file  . Number of children: 2  . Years of education: Not on file  . Highest education level: Not on file  Occupational History  . Not on file  Tobacco Use  . Smoking status: Former Smoker    Types: Cigarettes    Quit date: 1982    Years since quitting: 40.0  . Smokeless tobacco: Never Used  Vaping Use  . Vaping Use: Never used  Substance and Sexual Activity  . Alcohol use: No    Alcohol/week: 0.0 standard drinks  . Drug use: No  . Sexual activity: Never  Other Topics Concern  . Not on file  Social History Narrative   Pt lives with his son, daughter in law and 2 grandchildren   Social Determinants of Health   Financial Resource Strain: Low Risk   . Difficulty of Paying Living Expenses: Not hard at all  Food Insecurity: No Food Insecurity  . Worried About Charity fundraiser in the Last Year: Never true  . Ran Out of Food in the Last Year: Never true  Transportation Needs: No Transportation Needs  . Lack of Transportation (Medical): No  . Lack of Transportation (Non-Medical): No  Physical Activity: Insufficiently Active  . Days of Exercise per Week: 4 days  . Minutes of Exercise per Session: 20 min  Stress: No Stress Concern Present  . Feeling of Stress : Only a little  Social Connections: Socially Isolated  . Frequency of Communication with Friends and Family: More than three times a week  . Frequency of Social Gatherings with Friends  and Family: More than three times a week  . Attends Religious Services: Never  . Active Member of Clubs or Organizations: No  . Attends Archivist Meetings: Never  . Marital Status: Widowed    Tobacco Counseling Counseling given: Not Answered   Clinical Intake:  Pre-visit preparation completed: Yes  Pain : No/denies pain     Nutritional Risks: None Diabetes: No  How often do you need to have someone help you when you read instructions, pamphlets, or other written materials from your doctor or pharmacy?: 1 - Never    Interpreter Needed?: No  Information entered by :: Clemetine Marker LPN   Activities of Daily Living In your present state of health, do you have any difficulty performing the following activities: 04/02/2020 09/25/2019  Hearing? N N  Comment declines hearing aids -  Vision? N N  Difficulty concentrating or making  decisions? N N  Walking or climbing stairs? N N  Dressing or bathing? N N  Doing errands, shopping? N -  Preparing Food and eating ? N -  Using the Toilet? N -  In the past six months, have you accidently leaked urine? N -  Do you have problems with loss of bowel control? N -  Managing your Medications? N -  Managing your Finances? N -  Housekeeping or managing your Housekeeping? N -  Some recent data might be hidden    Patient Care Team: Juline Patch, MD as PCP - General (Family Medicine)  Indicate any recent Medical Services you may have received from other than [facility] providers in the past year (date may be approximate).     Assessment:   This is a routine wellness examination for [place].  Hearing/Vision screen  Hearing Screening   125Hz  250Hz  500Hz  1000Hz  2000Hz  3000Hz  4000Hz  6000Hz  8000Hz   Right ear:           Left ear:           Comments: Pt denies hearing difficulty  Vision Screening Comments: Past due for eye exam; plans to establish care with new provider  Dietary issues and exercise activities discussed: Current  Exercise Habits: Home exercise routine, Type of exercise: Other - see comments (exercise bike), Time (Minutes): 15, Frequency (Times/Week): 4, Weekly Exercise (Minutes/Week): 60, Exercise limited by: orthopedic condition(s)  Goals    . Increase physical activity     Pt would like to increase physical activity to 150 minutes per week      Depression Screen PHQ 2/9 Scores 04/02/2020 02/22/2020 12/25/2019 08/22/2019 04/02/2019 03/13/2019 09/08/2018  PHQ - 2 Score 0 0 0 0 0 0 0  PHQ- 9 Score - 0 0 0 - 0 0    Fall Risk Fall Risk  04/02/2020 02/22/2020 12/25/2019 08/22/2019 04/02/2019  Falls in the past year? 1 1 1  0 0  Number falls in past yr: 0 0 0 - 0  Injury with Fall? 1 1 1  - 0  Comment - - fx vertebrae - -  Risk for fall due to : History of fall(s);Orthopedic patient - - - No Fall Risks  Follow up - Falls evaluation completed Falls evaluation completed Falls evaluation completed Falls prevention discussed    FALL RISK PREVENTION PERTAINING TO THE HOME:  Any stairs in or around the home? Yes  If so, are there any without handrails? No  Home free of loose throw rugs in walkways, pet beds, electrical cords, etc? Yes  Adequate lighting in your home to reduce risk of falls? Yes   ASSISTIVE DEVICES UTILIZED TO PREVENT FALLS:  Life alert? No  Use of a cane, walker or w/c? No  Grab bars in the bathroom? Yes  Shower chair or bench in shower? Yes  Elevated toilet seat or a handicapped toilet? Yes   TIMED UP AND GO:  Was the test performed? No . Telephonic visit.   Cognitive Function: Normal cognitive status assessed by direct observation by this Nurse Health Advisor. No abnormalities found.          Immunizations Immunization History  Administered Date(s) Administered  . Fluad Quad(high Dose 65+) 12/25/2019  . Influenza, High Dose Seasonal PF 11/18/2016, 01/01/2019  . Influenza,inj,Quad PF,6+ Mos 11/04/2017, 11/14/2018  . Influenza-Unspecified 11/13/2016  . PFIZER(Purple  Top)SARS-COV-2 Vaccination 04/26/2019, 05/17/2019  . Pneumococcal Conjugate-13 02/18/2016  . Pneumococcal Polysaccharide-23 12/01/2012, 12/16/2016  . Tdap 12/01/2012, 02/18/2016  . Zoster Recombinat (Shingrix) 09/30/2017,  12/16/2017    TDAP status: Up to date  Flu Vaccine status: Up to date  Pneumococcal vaccine status: Up to date  Covid-19 vaccine status: Completed vaccines  Qualifies for Shingles Vaccine? Yes   Zostavax completed Yes   Shingrix Completed?: Yes  Screening Tests Health Maintenance  Topic Date Due  . URINE MICROALBUMIN  Never done  . COVID-19 Vaccine (3 - Booster for Pfizer series) 11/17/2019  . COLON CANCER SCREENING ANNUAL FOBT  08/26/2020  . TETANUS/TDAP  02/17/2026  . COLONOSCOPY (Pts 45-40yrs Insurance coverage will need to be confirmed)  09/24/2029  . INFLUENZA VACCINE  Completed  . Hepatitis C Screening  Completed  . PNA vac Low Risk Adult  Completed    Health Maintenance  Health Maintenance Due  Topic Date Due  . URINE MICROALBUMIN  Never done  . COVID-19 Vaccine (3 - Booster for Pfizer series) 11/17/2019    Colorectal cancer screening: Type of screening: Colonoscopy. Completed 09/25/19. Repeat every 1 years. Pt was advised per colonoscopy note he needs to repeat colonoscopy in 6 months with 2 day prep. Pt given phone # to [facility] to follow up.   Lung Cancer Screening: (Low Dose CT Chest recommended if Age 36-80 years, 30 pack-year currently smoking OR have quit w/in 15years.) does not qualify.   Additional Screening:  Hepatitis C Screening: does qualify; Completed 02/18/16  Vision Screening: Recommended annual ophthalmology exams for early detection of glaucoma and other disorders of the eye. Is the patient up to date with their annual eye exam?  No  Who is the provider or what is the name of the office in which the patient attends annual eye exams? Plans to establish care with new provider  Dental Screening: Recommended annual dental  exams for proper oral hygiene  Community Resource Referral / Chronic Care Management: CRR required this visit?  No   CCM required this visit?  No      Plan:     I have personally reviewed and noted the following in the patient's chart:   . Medical and social history . Use of alcohol, tobacco or illicit drugs  . Current medications and supplements . Functional ability and status . Nutritional status . Physical activity . Advanced directives . List of other physicians . Hospitalizations, surgeries, and ER visits in previous 12 months . Vitals . Screenings to include cognitive, depression, and falls . Referrals and appointments  In addition, I have reviewed and discussed with patient certain preventive protocols, quality metrics, and best practice recommendations. A written personalized care plan for preventive services as well as general preventive health recommendations were provided to patient.     Clemetine Marker, LPN   [id number]/3810   Nurse Notes: none

## 2020-04-03 DIAGNOSIS — M5116 Intervertebral disc disorders with radiculopathy, lumbar region: Secondary | ICD-10-CM | POA: Diagnosis not present

## 2020-04-03 DIAGNOSIS — R262 Difficulty in walking, not elsewhere classified: Secondary | ICD-10-CM | POA: Diagnosis not present

## 2020-04-03 DIAGNOSIS — M5416 Radiculopathy, lumbar region: Secondary | ICD-10-CM | POA: Diagnosis not present

## 2020-04-03 DIAGNOSIS — Z981 Arthrodesis status: Secondary | ICD-10-CM | POA: Diagnosis not present

## 2020-04-03 DIAGNOSIS — M4856XA Collapsed vertebra, not elsewhere classified, lumbar region, initial encounter for fracture: Secondary | ICD-10-CM | POA: Diagnosis not present

## 2020-04-06 ENCOUNTER — Other Ambulatory Visit: Payer: Self-pay | Admitting: Family Medicine

## 2020-04-10 DIAGNOSIS — N200 Calculus of kidney: Secondary | ICD-10-CM | POA: Diagnosis not present

## 2020-04-10 DIAGNOSIS — I10 Essential (primary) hypertension: Secondary | ICD-10-CM | POA: Diagnosis not present

## 2020-04-10 DIAGNOSIS — N1832 Chronic kidney disease, stage 3b: Secondary | ICD-10-CM | POA: Diagnosis not present

## 2020-04-15 DIAGNOSIS — M1A0790 Idiopathic chronic gout, unspecified ankle and foot, without tophus (tophi): Secondary | ICD-10-CM | POA: Diagnosis not present

## 2020-04-15 DIAGNOSIS — N1832 Chronic kidney disease, stage 3b: Secondary | ICD-10-CM | POA: Diagnosis not present

## 2020-04-15 DIAGNOSIS — I5022 Chronic systolic (congestive) heart failure: Secondary | ICD-10-CM | POA: Diagnosis not present

## 2020-04-15 DIAGNOSIS — I10 Essential (primary) hypertension: Secondary | ICD-10-CM | POA: Diagnosis not present

## 2020-05-19 DIAGNOSIS — I5022 Chronic systolic (congestive) heart failure: Secondary | ICD-10-CM | POA: Diagnosis not present

## 2020-05-19 DIAGNOSIS — I483 Typical atrial flutter: Secondary | ICD-10-CM | POA: Diagnosis not present

## 2020-05-19 DIAGNOSIS — N1831 Chronic kidney disease, stage 3a: Secondary | ICD-10-CM | POA: Diagnosis not present

## 2020-05-19 DIAGNOSIS — I10 Essential (primary) hypertension: Secondary | ICD-10-CM | POA: Diagnosis not present

## 2020-05-19 DIAGNOSIS — I2510 Atherosclerotic heart disease of native coronary artery without angina pectoris: Secondary | ICD-10-CM | POA: Diagnosis not present

## 2020-05-19 DIAGNOSIS — E782 Mixed hyperlipidemia: Secondary | ICD-10-CM | POA: Diagnosis not present

## 2020-07-07 ENCOUNTER — Telehealth: Payer: Self-pay

## 2020-07-07 DIAGNOSIS — E785 Hyperlipidemia, unspecified: Secondary | ICD-10-CM | POA: Diagnosis not present

## 2020-07-07 DIAGNOSIS — N178 Other acute kidney failure: Secondary | ICD-10-CM | POA: Diagnosis not present

## 2020-07-07 DIAGNOSIS — R571 Hypovolemic shock: Secondary | ICD-10-CM | POA: Diagnosis not present

## 2020-07-07 DIAGNOSIS — E872 Acidosis, unspecified: Secondary | ICD-10-CM | POA: Insufficient documentation

## 2020-07-07 DIAGNOSIS — R9431 Abnormal electrocardiogram [ECG] [EKG]: Secondary | ICD-10-CM | POA: Diagnosis not present

## 2020-07-07 DIAGNOSIS — I9589 Other hypotension: Secondary | ICD-10-CM | POA: Diagnosis not present

## 2020-07-07 DIAGNOSIS — E860 Dehydration: Secondary | ICD-10-CM | POA: Diagnosis not present

## 2020-07-07 DIAGNOSIS — R93421 Abnormal radiologic findings on diagnostic imaging of right kidney: Secondary | ICD-10-CM | POA: Diagnosis not present

## 2020-07-07 DIAGNOSIS — E875 Hyperkalemia: Secondary | ICD-10-CM | POA: Diagnosis not present

## 2020-07-07 DIAGNOSIS — M109 Gout, unspecified: Secondary | ICD-10-CM | POA: Diagnosis not present

## 2020-07-07 DIAGNOSIS — R197 Diarrhea, unspecified: Secondary | ICD-10-CM | POA: Diagnosis not present

## 2020-07-07 DIAGNOSIS — N1832 Chronic kidney disease, stage 3b: Secondary | ICD-10-CM | POA: Diagnosis not present

## 2020-07-07 DIAGNOSIS — I5189 Other ill-defined heart diseases: Secondary | ICD-10-CM | POA: Diagnosis not present

## 2020-07-07 DIAGNOSIS — N170 Acute kidney failure with tubular necrosis: Secondary | ICD-10-CM | POA: Diagnosis not present

## 2020-07-07 DIAGNOSIS — I4892 Unspecified atrial flutter: Secondary | ICD-10-CM | POA: Diagnosis not present

## 2020-07-07 DIAGNOSIS — N179 Acute kidney failure, unspecified: Secondary | ICD-10-CM | POA: Diagnosis not present

## 2020-07-07 DIAGNOSIS — I2510 Atherosclerotic heart disease of native coronary artery without angina pectoris: Secondary | ICD-10-CM | POA: Diagnosis not present

## 2020-07-07 DIAGNOSIS — I272 Pulmonary hypertension, unspecified: Secondary | ICD-10-CM | POA: Insufficient documentation

## 2020-07-07 DIAGNOSIS — E871 Hypo-osmolality and hyponatremia: Secondary | ICD-10-CM | POA: Insufficient documentation

## 2020-07-07 DIAGNOSIS — Z20822 Contact with and (suspected) exposure to covid-19: Secondary | ICD-10-CM | POA: Diagnosis not present

## 2020-07-07 DIAGNOSIS — Z87891 Personal history of nicotine dependence: Secondary | ICD-10-CM | POA: Diagnosis not present

## 2020-07-07 DIAGNOSIS — I959 Hypotension, unspecified: Secondary | ICD-10-CM | POA: Diagnosis not present

## 2020-07-07 DIAGNOSIS — I130 Hypertensive heart and chronic kidney disease with heart failure and stage 1 through stage 4 chronic kidney disease, or unspecified chronic kidney disease: Secondary | ICD-10-CM | POA: Diagnosis not present

## 2020-07-07 DIAGNOSIS — Z955 Presence of coronary angioplasty implant and graft: Secondary | ICD-10-CM | POA: Diagnosis not present

## 2020-07-07 DIAGNOSIS — I517 Cardiomegaly: Secondary | ICD-10-CM | POA: Diagnosis not present

## 2020-07-07 DIAGNOSIS — N183 Chronic kidney disease, stage 3 unspecified: Secondary | ICD-10-CM | POA: Diagnosis not present

## 2020-07-07 DIAGNOSIS — I77810 Thoracic aortic ectasia: Secondary | ICD-10-CM | POA: Diagnosis not present

## 2020-07-07 DIAGNOSIS — I5022 Chronic systolic (congestive) heart failure: Secondary | ICD-10-CM | POA: Diagnosis not present

## 2020-07-07 DIAGNOSIS — Z791 Long term (current) use of non-steroidal anti-inflammatories (NSAID): Secondary | ICD-10-CM | POA: Diagnosis not present

## 2020-07-07 DIAGNOSIS — I252 Old myocardial infarction: Secondary | ICD-10-CM | POA: Diagnosis not present

## 2020-07-07 DIAGNOSIS — Z7901 Long term (current) use of anticoagulants: Secondary | ICD-10-CM | POA: Diagnosis not present

## 2020-07-07 DIAGNOSIS — Z960 Presence of urogenital implants: Secondary | ICD-10-CM | POA: Diagnosis not present

## 2020-07-07 NOTE — Telephone Encounter (Unsigned)
Copied from [place] [phone]. Topic: Appointment Scheduling - Scheduling Inquiry for Clinic >> Jul 07, 2020 10:39 AM Tessa Lerner A wrote: Reason for CRM: Patient's son would like patient to be seen in office for flu like symptoms and diarrhea  Per DT, agent was unable to schedule an office visit at the time of call  Please contact patient's son to further advise scheduling

## 2020-07-07 NOTE — Telephone Encounter (Signed)
Spoke to daughter- pt is going to hospital

## 2020-07-07 NOTE — Telephone Encounter (Signed)
Copied from [place] [id number]. Topic: General - Call Back - No Documentation >> Jul 07, 2020 11:01 AM Erick Blinks wrote: Reason for CRM: Pt's daughter is requesting to speak to the nurse, she says that this is "kinda urgent" she wants to speak to nurse.  [phone]

## 2020-07-07 NOTE — Telephone Encounter (Signed)
Called Aud[facility] Act[facility]- there is concern for dehydration and he has stage 3 kidney disease. Needs to go to ER for STAT labs and possible IV fluids

## 2020-07-08 DIAGNOSIS — I517 Cardiomegaly: Secondary | ICD-10-CM | POA: Diagnosis not present

## 2020-07-08 DIAGNOSIS — I77810 Thoracic aortic ectasia: Secondary | ICD-10-CM | POA: Diagnosis not present

## 2020-07-08 DIAGNOSIS — I5189 Other ill-defined heart diseases: Secondary | ICD-10-CM | POA: Diagnosis not present

## 2020-07-08 DIAGNOSIS — N183 Chronic kidney disease, stage 3 unspecified: Secondary | ICD-10-CM | POA: Diagnosis not present

## 2020-07-08 DIAGNOSIS — N170 Acute kidney failure with tubular necrosis: Secondary | ICD-10-CM | POA: Diagnosis not present

## 2020-07-08 LAB — HEMOGLOBIN A1C: Hemoglobin A1C: 4.8

## 2020-07-10 ENCOUNTER — Telehealth: Payer: Self-pay | Admitting: Family Medicine

## 2020-07-10 NOTE — Telephone Encounter (Signed)
Christopher [name] with [facility] is calling to see if dr Ronnald Ramp is willing to sign orders for Home health orders for PT and OT verbal is ok. Pt will be discharged today

## 2020-07-10 NOTE — Telephone Encounter (Signed)
Please call [facility] and tell her this PT/OT will need to come from cardiology

## 2020-07-11 ENCOUNTER — Telehealth: Payer: Self-pay | Admitting: Family Medicine

## 2020-07-11 NOTE — Telephone Encounter (Signed)
Called and left another message stating [facility] needs to sign off on orders for Christopher [name]

## 2020-07-11 NOTE — Telephone Encounter (Signed)
Called to speak with the nurse or doctor further regarding home health orders.  She stated she wanted to explain further why she needs these orders signed.  Please call at [phone]

## 2020-07-11 NOTE — Telephone Encounter (Signed)
Spoke to [place] will contact cardiology

## 2020-07-15 DIAGNOSIS — N183 Chronic kidney disease, stage 3 unspecified: Secondary | ICD-10-CM | POA: Diagnosis not present

## 2020-07-15 DIAGNOSIS — N170 Acute kidney failure with tubular necrosis: Secondary | ICD-10-CM | POA: Diagnosis not present

## 2020-07-15 DIAGNOSIS — I10 Essential (primary) hypertension: Secondary | ICD-10-CM | POA: Diagnosis not present

## 2020-07-15 DIAGNOSIS — N200 Calculus of kidney: Secondary | ICD-10-CM | POA: Diagnosis not present

## 2020-07-15 DIAGNOSIS — N1831 Chronic kidney disease, stage 3a: Secondary | ICD-10-CM | POA: Diagnosis not present

## 2020-07-18 ENCOUNTER — Ambulatory Visit (INDEPENDENT_AMBULATORY_CARE_PROVIDER_SITE_OTHER): Payer: Medicare Other | Admitting: Family Medicine

## 2020-07-18 ENCOUNTER — Encounter: Payer: Self-pay | Admitting: Family Medicine

## 2020-07-18 ENCOUNTER — Other Ambulatory Visit: Payer: Self-pay

## 2020-07-18 VITALS — BP 112/70 | HR 76 | Ht 75.0 in | Wt 238.0 lb

## 2020-07-18 DIAGNOSIS — Z8639 Personal history of other endocrine, nutritional and metabolic disease: Secondary | ICD-10-CM | POA: Diagnosis not present

## 2020-07-18 DIAGNOSIS — I5022 Chronic systolic (congestive) heart failure: Secondary | ICD-10-CM

## 2020-07-18 DIAGNOSIS — N179 Acute kidney failure, unspecified: Secondary | ICD-10-CM

## 2020-07-18 DIAGNOSIS — N1831 Chronic kidney disease, stage 3a: Secondary | ICD-10-CM

## 2020-07-18 DIAGNOSIS — E860 Dehydration: Secondary | ICD-10-CM | POA: Diagnosis not present

## 2020-07-18 NOTE — Progress Notes (Signed)
Date:  07/18/2020   Name:  Christopher [name]   DOB:  1945/08/30   MRN:  [id number]   Chief Complaint: Follow-up (Follow up from hospital discharge 07/10/20- hypotension and dehydration)  Pt was recently admitted to [facility] for acute renal injury on ckd on 07/07/20 and was discharged on 07/07/20 . Transition of care call placed on 07/12/20..    Lab Results  Component Value Date   CREATININE 1.60 (H) 12/25/2019   BUN 26 12/25/2019   NA 140 12/25/2019   K 4.4 12/25/2019   CL 104 12/25/2019   CO2 21 12/25/2019   Lab Results  Component Value Date   CHOL 101 02/22/2020   HDL 26 (L) 02/22/2020   LDLCALC 53 02/22/2020   TRIG 117 02/22/2020   CHOLHDL 5.0 01/24/2018   Lab Results  Component Value Date   TSH 3.423 06/12/2014   Lab Results  Component Value Date   HGBA1C 4.8 07/08/2020   Lab Results  Component Value Date   WBC 10.5 12/25/2019   HGB 12.8 (L) 12/25/2019   HCT 38.4 12/25/2019   MCV 90 12/25/2019   PLT 244 12/25/2019   Lab Results  Component Value Date   ALT 18 12/16/2016   AST 18 12/16/2016   ALKPHOS 96 12/16/2016   BILITOT 0.5 12/16/2016     Review of Systems  Constitutional: Negative for chills and fever.  HENT: Negative for drooling, ear discharge, ear pain and sore throat.   Respiratory: Negative for cough, shortness of breath and wheezing.   Cardiovascular: Negative for chest pain, palpitations and leg swelling.  Gastrointestinal: Negative for abdominal pain, blood in stool, constipation, diarrhea and nausea.  Endocrine: Negative for polydipsia.  Genitourinary: Negative for dysuria, frequency, hematuria and urgency.  Musculoskeletal: Negative for back pain, myalgias and neck pain.  Skin: Negative for rash.  Allergic/Immunologic: Negative for environmental allergies.  Neurological: Negative for dizziness and headaches.  Hematological: Does not bruise/bleed easily.  Psychiatric/Behavioral: Negative for suicidal ideas. The patient is not  nervous/anxious.     Patient Active Problem List   Diagnosis Date Noted  . Family history of colon cancer   . Polyp of colon   . Ischemic cardiomyopathy 12/19/2018  . Acute renal failure superimposed on stage 3 chronic kidney disease ([place]) 10/14/2018  . Atrial flutter ([place]) 10/14/2018  . Chronic systolic heart failure ([place]) 10/14/2018  . History of TIA (transient ischemic attack) 10/14/2018  . Volume overload 10/14/2018  . Cerebral vascular disease 09/08/2018  . TIA (transient ischemic attack) 12/12/2017  . 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 Encounter for general adult medical examination without abnormal findings 10/22/2014  . Disorder of kidney and ureter 10/22/2014  . Renal colic 41/96/2229  . 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 Creatinine elevation 10/22/2014  . Essential (primary) hypertension 10/22/2014  . H/O hypercholesterolemia 10/22/2014  . Dysmetabolic syndrome 79/89/2119  . H/O acute myocardial infarction 10/22/2014  . Adiposity 10/22/2014  . Impaired renal function 10/22/2014  . Need for vaccination 10/22/2014  . 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 S/P coronary artery stent placement 04/09/2014  . Primary osteoarthritis of right knee 03/26/2014  . Chronic kidney disease, stage [name] (moderate) (HCC) 01/25/2011    No Known Allergies  Past Surgical History:  Procedure Laterality Date  . CARDIAC CATHETERIZATION  2000   stent   . COLONOSCOPY  2011  . COLONOSCOPY WITH PROPOFOL N/A 09/25/2019   Procedure: COLONOSCOPY WITH PROPOFOL;  Surgeon: Virgel Manifold, MD;  Location: [place];  Service: Endoscopy;  Laterality: N/A;  priority 4  . JOINT REPLACEMENT Right    knee  . KNEE ARTHROSCOPY Right 2016  . POLYPECTOMY N/A 09/25/2019   Procedure: POLYPECTOMY;  Surgeon: Virgel Manifold, MD;  Location: [place];  Service: Endoscopy;  Laterality: N/A;    Social History   Tobacco Use  .  Smoking status: Former Smoker    Types: Cigarettes    Quit date: 1982    Years since quitting: 40.3  . Smokeless tobacco: Never Used  Vaping Use  . Vaping Use: Never used  Substance Use Topics  . Alcohol use: No    Alcohol/week: 0.0 standard drinks  . Drug use: No     Medication list has been reviewed and updated.  Current Meds  Medication Sig  . allopurinol (ZYLOPRIM) 100 MG tablet TAKE 1 TABLET(100 MG) BY MOUTH DAILY  . atorvastatin (LIPITOR) 80 MG tablet Take 1 tablet (80 mg total) by mouth daily. cardio  . dapagliflozin propanediol (FARXIGA) 10 MG TABS tablet Take 1 tablet by mouth daily. [facility]  . diclofenac sodium (VOLTAREN) 1 % GEL Apply 4 g topically 4 (four) times daily.  Marland Kitchen ENTRESTO 97-103 MG Take 1 tablet by mouth 2 (two) times daily.  . metoprolol (TOPROL-XL) 200 MG 24 hr tablet Take 1 tablet by mouth daily. cardio  . rivaroxaban (XARELTO) 20 MG TABS tablet Take 1 tablet by mouth daily. cardiology  . sertraline (ZOLOFT) 25 MG tablet TAKE 1 TABLET(25 MG) BY MOUTH DAILY  . sodium bicarbonate 650 MG tablet Take 650 mg by mouth 2 (two) times daily.  Marland Kitchen spironolactone (ALDACTONE) 25 MG tablet Take 1 tablet by mouth daily. cardio  . torsemide (DEMADEX) 20 MG tablet Take 20 mg by mouth as needed. cardio    PHQ 2/9 Scores 07/18/2020 04/02/2020 02/22/2020 12/25/2019  PHQ - 2 Score 0 0 0 0  PHQ- 9 Score 0 - 0 0    GAD 7 : Generalized Anxiety Score 07/18/2020 02/22/2020 12/25/2019 08/22/2019  Nervous, Anxious, on Edge 0 0 0 0  Control/stop worrying 0 0 0 0  Worry too much - different things 0 0 0 0  Trouble relaxing 0 0 0 0  Restless 0 0 0 0  Easily annoyed or irritable 0 0 0 0  Afraid - awful might happen 0 0 0 0  Total GAD 7 Score 0 0 0 0    BP Readings from Last 3 Encounters:  07/18/20 112/70  02/22/20 110/62  12/25/19 130/80    Physical Exam Vitals and nursing note reviewed.  HENT:     Head: Normocephalic.     Right Ear: Tympanic membrane, ear canal and external ear  normal.     Left Ear: Tympanic membrane, ear canal and external ear normal.     Nose: Nose normal.  Eyes:     General: No scleral icterus.       Right eye: No discharge.        Left eye: No discharge.     Conjunctiva/sclera: Conjunctivae normal.     Pupils: Pupils are equal, round, and reactive to light.  Neck:     Thyroid: No thyromegaly.     Vascular: No JVD.     Trachea: No tracheal deviation.  Cardiovascular:     Rate and Rhythm: Normal rate and regular rhythm.     Heart sounds: Normal heart sounds. No murmur heard. No friction rub. No gallop.   Pulmonary:  Effort: No respiratory distress.     Breath sounds: Normal breath sounds. No decreased breath sounds, wheezing, rhonchi or rales.  Abdominal:     General: Bowel sounds are normal.     Palpations: Abdomen is soft. There is no mass.     Tenderness: There is no abdominal tenderness. There is no guarding or rebound.  Musculoskeletal:        General: No tenderness. Normal range of motion.     Cervical back: Normal range of motion and neck supple.  Lymphadenopathy:     Cervical: No cervical adenopathy.  Skin:    General: Skin is warm.     Findings: No rash.  Neurological:     Mental Status: He is alert and oriented to person, place, and time.     Cranial Nerves: No cranial nerve deficit.     Deep Tendon Reflexes: Reflexes are normal and symmetric.     Wt Readings from Last 3 Encounters:  07/18/20 238 lb (108 kg)  02/22/20 256 lb (116.1 kg)  12/25/19 271 lb (122.9 kg)    BP 112/70   Pulse 76   Ht 6\' 3"  (1.905 m)   Wt 238 lb (108 kg)   BMI 29.75 kg/m  [facility] Discharge Acute Issues Care Follow Up                                                                        Patient Demographics  Christopher [name], is a 75 y.o. male  DOB 10/27/1945  MRN [id number].  Primary MD  Juline Patch,  MD   Reason for TCC follow Up -to assess hemodynamics and stability of current cardiac and renal status.   Past Medical History:  Diagnosis Date  . Arthritis    ankles, hands, left knee  . CHF (congestive heart failure) ([place])   . Chronic kidney disease (CKD), stage [name] (moderate) (HCC)   . Coronary artery disease   . Gout   . Hyperlipidemia   . Hypertension   . Myocardial infarction ([place]) 2000  . TIA (transient ischemic attack) 2003  . Wears dentures    full upper    Past Surgical History:  Procedure Laterality Date  . CARDIAC CATHETERIZATION  2000   stent   . COLONOSCOPY  2011  . COLONOSCOPY WITH PROPOFOL N/A 09/25/2019   Procedure: COLONOSCOPY WITH PROPOFOL;  Surgeon: Virgel Manifold, MD;  Location: [place];  Service: Endoscopy;  Laterality: N/A;  priority 4  . JOINT REPLACEMENT Right    knee  . KNEE ARTHROSCOPY Right 2016  . POLYPECTOMY N/A 09/25/2019   Procedure: POLYPECTOMY;  Surgeon: Virgel Manifold, MD;  Location: [place];  Service: Endoscopy;  Laterality: N/A;       Recent HPI and Hospital Course  Upon discharge from [facility] patient has done well.  Patient was discharged with acute renal insufficiency on underlying chronic kidney disease stage IIIa.  Patient continues to do  well and is hydrating the previous dehydration which resulted in prerenal azotemia and volume under load injury.  Patient will be following up with upcoming appointments with nephrology and cardiology.  Patient has done well since discharge and previous labs that were done were reviewed and hyperkalemia and BUN/creatinine have improved.  [facility] Acute Care Issue to be followed in the Clinic   As noted patient was reviewed today and exam today is normal for patient is stable with vital signs that are acceptable.  Patient relates no concern is tolerating current fluid rehydration well.  There is no subjective concerns at this  time   Subjective:   Christopher [name] today has, No headache, No chest pain, No abdominal pain - No Nausea, No new weakness tingling or numbness, No Cough - SOB.  There is no DOE, orthopnea, nor PND.  Patient was able to with assistance of his walker come to the clinic into the room without significant DOE.  Assessment & Plan    1. AKI (acute kidney injury) ([place]) New onset.  Resolved.  Stable.  Patient following hospitalization for careful rehydration given baseline chronic congestive heart failure has done well at current therapy with fluid replacement.  The only other change was in from Eliquis to Xarelto but I am not certain for what reason.  Patient is using the torsemide on an as-needed basis rather than on a regular basis.  2. Stage 3a chronic kidney disease [facility]) Patient is followed by nephrology per [facility].  He is doing well and has an upcoming reevaluation.  3. History of hyperkalemia Potassium is returned to normal range with hydration upon hospitalization and is continued to be stable.  4. Dehydration Blood pressure is acceptable in the 1 12-70 range but still we will be cautious with fluid rehydration given patient's baseline congestive heart failure.  5. Chronic systolic heart failure ([place]) Chronic stable controlled patient is followed by [facility] cardiology at this time and is continuing to do well.  He is scheduled to be rechecked in the not too distant future.    Reason for frequent admissions/ER visits this particular admission was unplanned but is likely secondary to the 3 to 4-day history of diarrhea which quickly dehydration was set up on which resulted in prerenal perfusion reduction leading to an acute kidney injury on already chronic kidney disease.  This was on predictable and patient knows in the future that if he should have more than 24 hours of diarrhea that he is to contact us.      Objective:   Vitals:   07/18/20 1328  BP: 112/70  Pulse: 76  Weight: 238 lb (108  kg)  Height: 6\' 3"  (1.905 m)    Wt Readings from Last 3 Encounters:  07/18/20 238 lb (108 kg)  02/22/20 256 lb (116.1 kg)  12/25/19 271 lb (122.9 kg)    Allergies as of 07/18/2020   No Known Allergies     Medication List       Accurate as of Jul 18, 2020  2:36 PM. If you have any questions, ask your nurse or doctor.        STOP taking these medications   apixaban 5 MG Tabs tablet Commonly known as: ELIQUIS Stopped by: Otilio Miu, MD     TAKE these medications   allopurinol 100 MG tablet Commonly known as: ZYLOPRIM TAKE 1 TABLET(100 MG) BY MOUTH DAILY   atorvastatin 80 MG tablet Commonly known as: LIPITOR Take 1 tablet (80 mg total) by mouth  daily. cardio   dapagliflozin propanediol 10 MG Tabs tablet Commonly known as: FARXIGA Take 1 tablet by mouth daily. [facility]   diclofenac sodium 1 % Gel Commonly known as: VOLTAREN Apply 4 g topically 4 (four) times daily.   Entresto 97-103 MG Generic drug: sacubitril-valsartan Take 1 tablet by mouth 2 (two) times daily.   metoprolol 200 MG 24 hr tablet Commonly known as: TOPROL-XL Take 1 tablet by mouth daily. cardio   rivaroxaban 20 MG Tabs tablet Commonly known as: XARELTO Take 1 tablet by mouth daily. cardiology   sertraline 25 MG tablet Commonly known as: ZOLOFT TAKE 1 TABLET(25 MG) BY MOUTH DAILY   sodium bicarbonate 650 MG tablet Take 650 mg by mouth 2 (two) times daily.   spironolactone 25 MG tablet Commonly known as: ALDACTONE Take 1 tablet by mouth daily. cardio   torsemide 20 MG tablet Commonly known as: DEMADEX Take 20 mg by mouth as needed. cardio        Physical Exam: Constitutional: Patient appears well-developed and well-nourished. Not in obvious distress. HENT: Normocephalic, atraumatic, External right and left ear normal. Oropharynx is clear and moist.  Eyes: Conjunctivae and EOM are normal. PERRLA, no scleral icterus. Neck: Normal ROM. Neck supple. No JVD. No tracheal deviation. No  thyromegaly. CVS: RRR, S1/S2 +, no murmurs, no gallops, no carotid bruit.  Pulmonary: Effort and breath sounds normal, no stridor, rhonchi, wheezes, rales.  Abdominal: Soft. BS +, no distension, tenderness, rebound or guarding.  Musculoskeletal: Normal range of motion. No edema and no tenderness.  Lymphadenopathy: No lymphadenopathy noted, cervical, inguinal or axillary Neuro: Alert. Normal reflexes, muscle tone coordination. No cranial nerve deficit. Skin: Skin is warm and dry. No rash noted. Not diaphoretic. No erythema. No pallor. Psychiatric: Normal mood and affect. Behavior, judgment, thought content normal.   Data Review   Micro Results No results found for this or any previous visit (from the past 240 hour(s)).   CBC No results for input(s): WBC, HGB, HCT, PLT, MCV, MCH, MCHC, RDW, LYMPHSABS, MONOABS, EOSABS, BASOSABS, BANDABS in the last 168 hours.  Invalid input(s): NEUTRABS, BANDSABD  Chemistries  No results for input(s): NA, K, CL, CO2, GLUCOSE, BUN, CREATININE, CALCIUM, MG, AST, ALT, ALKPHOS, BILITOT in the last 168 hours.  Invalid input(s): GFRCGP ------------------------------------------------------------------------------------------------------------------ CrCl cannot be calculated (Patient's most recent lab result is older than the maximum 21 days allowed.). ------------------------------------------------------------------------------------------------------------------ No results for input(s): HGBA1C in the last 72 hours. ------------------------------------------------------------------------------------------------------------------ No results for input(s): CHOL, HDL, LDLCALC, TRIG, CHOLHDL, LDLDIRECT in the last 72 hours. ------------------------------------------------------------------------------------------------------------------ No results for input(s): TSH, T4TOTAL, T3FREE, THYROIDAB in the last 72 hours.  Invalid input(s):  FREET3 ------------------------------------------------------------------------------------------------------------------ No results for input(s): VITAMINB12, FOLATE, FERRITIN, TIBC, IRON, RETICCTPCT in the last 72 hours.  Coagulation profile No results for input(s): INR, PROTIME in the last 168 hours.  No results for input(s): DDIMER in the last 72 hours.  Cardiac Enzymes No results for input(s): CKMB, TROPONINI, MYOGLOBIN in the last 168 hours.  Invalid input(s): CK ------------------------------------------------------------------------------------------------------------------ Invalid input(s): POCBNP   Time Spent in minutes  [place] M.D on 07/18/2020 at 2:36 PM   Disclaimer: This note may have been dictated with voice recognition software. Similar sounding words can inadvertently be transcribed and this note may contain transcription errors which may not have been corrected upon publication of note.  Assessment and Plan: 1. AKI (acute kidney injury) ([place]) See above assessment 2. Stage 3a chronic kidney disease ([place]) See above assessment  3. History  of hyperkalemia See above assessment  4. Dehydration See above assessment  5. Chronic systolic heart failure ([place]) See above assessment

## 2020-07-21 DIAGNOSIS — I10 Essential (primary) hypertension: Secondary | ICD-10-CM | POA: Diagnosis not present

## 2020-07-21 DIAGNOSIS — I5022 Chronic systolic (congestive) heart failure: Secondary | ICD-10-CM | POA: Diagnosis not present

## 2020-07-21 DIAGNOSIS — N1831 Chronic kidney disease, stage 3a: Secondary | ICD-10-CM | POA: Diagnosis not present

## 2020-07-21 DIAGNOSIS — M1A0790 Idiopathic chronic gout, unspecified ankle and foot, without tophus (tophi): Secondary | ICD-10-CM | POA: Diagnosis not present

## 2020-08-25 ENCOUNTER — Other Ambulatory Visit: Payer: Self-pay | Admitting: Family Medicine

## 2020-08-25 DIAGNOSIS — E7849 Other hyperlipidemia: Secondary | ICD-10-CM

## 2020-08-25 DIAGNOSIS — E790 Hyperuricemia without signs of inflammatory arthritis and tophaceous disease: Secondary | ICD-10-CM

## 2020-09-25 DIAGNOSIS — N184 Chronic kidney disease, stage 4 (severe): Secondary | ICD-10-CM | POA: Diagnosis not present

## 2020-09-25 DIAGNOSIS — I10 Essential (primary) hypertension: Secondary | ICD-10-CM | POA: Diagnosis not present

## 2020-09-25 DIAGNOSIS — M1A0790 Idiopathic chronic gout, unspecified ankle and foot, without tophus (tophi): Secondary | ICD-10-CM | POA: Diagnosis not present

## 2020-09-25 DIAGNOSIS — N179 Acute kidney failure, unspecified: Secondary | ICD-10-CM | POA: Diagnosis not present

## 2020-09-29 DIAGNOSIS — N1831 Chronic kidney disease, stage 3a: Secondary | ICD-10-CM | POA: Diagnosis not present

## 2020-09-29 DIAGNOSIS — N2581 Secondary hyperparathyroidism of renal origin: Secondary | ICD-10-CM | POA: Diagnosis not present

## 2020-09-29 DIAGNOSIS — I10 Essential (primary) hypertension: Secondary | ICD-10-CM | POA: Diagnosis not present

## 2020-12-15 DIAGNOSIS — C4441 Basal cell carcinoma of skin of scalp and neck: Secondary | ICD-10-CM | POA: Diagnosis not present

## 2020-12-15 DIAGNOSIS — D485 Neoplasm of uncertain behavior of skin: Secondary | ICD-10-CM | POA: Diagnosis not present

## 2020-12-15 DIAGNOSIS — L578 Other skin changes due to chronic exposure to nonionizing radiation: Secondary | ICD-10-CM | POA: Diagnosis not present

## 2020-12-15 DIAGNOSIS — C44319 Basal cell carcinoma of skin of other parts of face: Secondary | ICD-10-CM | POA: Diagnosis not present

## 2020-12-15 DIAGNOSIS — C44212 Basal cell carcinoma of skin of right ear and external auricular canal: Secondary | ICD-10-CM | POA: Diagnosis not present

## 2020-12-29 DIAGNOSIS — C44622 Squamous cell carcinoma of skin of right upper limb, including shoulder: Secondary | ICD-10-CM | POA: Diagnosis not present

## 2020-12-29 DIAGNOSIS — L570 Actinic keratosis: Secondary | ICD-10-CM | POA: Diagnosis not present

## 2020-12-29 DIAGNOSIS — D485 Neoplasm of uncertain behavior of skin: Secondary | ICD-10-CM | POA: Diagnosis not present

## 2021-01-19 DIAGNOSIS — C4441 Basal cell carcinoma of skin of scalp and neck: Secondary | ICD-10-CM | POA: Diagnosis not present

## 2021-01-19 DIAGNOSIS — C4491 Basal cell carcinoma of skin, unspecified: Secondary | ICD-10-CM | POA: Diagnosis not present

## 2021-02-02 DIAGNOSIS — C4491 Basal cell carcinoma of skin, unspecified: Secondary | ICD-10-CM | POA: Diagnosis not present

## 2021-02-02 DIAGNOSIS — C4441 Basal cell carcinoma of skin of scalp and neck: Secondary | ICD-10-CM | POA: Diagnosis not present

## 2021-02-16 DIAGNOSIS — C4491 Basal cell carcinoma of skin, unspecified: Secondary | ICD-10-CM | POA: Diagnosis not present

## 2021-02-16 DIAGNOSIS — C44319 Basal cell carcinoma of skin of other parts of face: Secondary | ICD-10-CM | POA: Diagnosis not present

## 2021-02-28 ENCOUNTER — Other Ambulatory Visit: Payer: Self-pay | Admitting: Family Medicine

## 2021-02-28 DIAGNOSIS — E7849 Other hyperlipidemia: Secondary | ICD-10-CM

## 2021-02-28 DIAGNOSIS — E790 Hyperuricemia without signs of inflammatory arthritis and tophaceous disease: Secondary | ICD-10-CM

## 2021-03-01 NOTE — Telephone Encounter (Signed)
Requested medication (s) are due for refill today: yes to both meds  Requested medication (s) are on the active medication list: yes  Last refill:  both meds refilled same day 08/28/20 #90 1 RF  Future visit scheduled: no  Notes to clinic:  Called pt and LM on VM to call to schedule appt Overdue labs, overdue OV   Requested Prescriptions  Pending Prescriptions Disp Refills   atorvastatin (LIPITOR) 80 MG tablet [Pharmacy Med Name: ATORVASTATIN 80MG  TABLETS] 90 tablet 1    Sig: TAKE 1 TABLET BY MOUTH DAILY     Cardiovascular:  Antilipid - Statins Failed - 02/28/2021 10:23 AM      Failed - Total Cholesterol in normal range and within 360 days    Cholesterol, Total  Date Value Ref Range Status  02/22/2020 101 100 - 199 mg/dL Final          Failed - LDL in normal range and within 360 days    LDL Chol Calc (NIH)  Date Value Ref Range Status  02/22/2020 53 0 - 99 mg/dL Final          Failed - HDL in normal range and within 360 days    HDL  Date Value Ref Range Status  02/22/2020 26 (L) >39 mg/dL Final          Failed - Triglycerides in normal range and within 360 days    Triglycerides  Date Value Ref Range Status  02/22/2020 117 0 - 149 mg/dL Final          Passed - Patient is not pregnant      Passed - Valid encounter within last 12 months    Recent Outpatient Visits           7 months ago AKI (acute kidney injury) ([place])   [facility] Juline Patch, MD   1 year ago Familial multiple lipoprotein-type hyperlipidemia   [facility] Juline Patch, MD   1 year ago Hospital discharge follow-up   [facility] Juline Patch, MD   1 year ago Hyperuricemia   [facility] Juline Patch, MD   1 year ago Coronary artery abnormality   [facility] Juline Patch, MD               allopurinol (ZYLOPRIM) 100 MG tablet [Pharmacy Med Name: ALLOPURINOL 100MG  TABLETS] 90 tablet 1    Sig: TAKE 1 TABLET(100 MG) BY MOUTH  DAILY     Endocrinology:  Gout Agents Failed - 02/28/2021 10:23 AM      Failed - Uric Acid in normal range and within 360 days    No results found for: POCURA, LABURIC        Failed - Cr in normal range and within 360 days    Creatinine  Date Value Ref Range Status  10/15/2014 1.81 (H) mg/dL Final    Comment:    0.61-1.24 NOTE: New Reference Range  05/21/14    Creatinine, Ser  Date Value Ref Range Status  12/25/2019 1.60 (H) 0.76 - 1.27 mg/dL Final          Passed - Valid encounter within last 12 months    Recent Outpatient Visits           7 months ago AKI (acute kidney injury) [facility])   [facility] Juline Patch, MD   1 year ago Familial multiple lipoprotein-type hyperlipidemia   [facility] Juline Patch, MD  1 year ago Hospital discharge follow-up   [facility] Juline Patch, MD   1 year ago Hyperuricemia   [facility] Juline Patch, MD   1 year ago Coronary artery abnormality   [facility] Juline Patch, MD

## 2021-03-02 ENCOUNTER — Other Ambulatory Visit: Payer: Self-pay | Admitting: Family Medicine

## 2021-03-02 DIAGNOSIS — C4491 Basal cell carcinoma of skin, unspecified: Secondary | ICD-10-CM | POA: Diagnosis not present

## 2021-03-02 DIAGNOSIS — E790 Hyperuricemia without signs of inflammatory arthritis and tophaceous disease: Secondary | ICD-10-CM

## 2021-03-02 DIAGNOSIS — C44212 Basal cell carcinoma of skin of right ear and external auricular canal: Secondary | ICD-10-CM | POA: Diagnosis not present

## 2021-03-02 DIAGNOSIS — E7849 Other hyperlipidemia: Secondary | ICD-10-CM

## 2021-03-31 DIAGNOSIS — N1831 Chronic kidney disease, stage 3a: Secondary | ICD-10-CM | POA: Diagnosis not present

## 2021-03-31 DIAGNOSIS — M899 Disorder of bone, unspecified: Secondary | ICD-10-CM | POA: Diagnosis not present

## 2021-03-31 DIAGNOSIS — N183 Chronic kidney disease, stage 3 unspecified: Secondary | ICD-10-CM | POA: Diagnosis not present

## 2021-03-31 DIAGNOSIS — I10 Essential (primary) hypertension: Secondary | ICD-10-CM | POA: Diagnosis not present

## 2021-03-31 DIAGNOSIS — N189 Chronic kidney disease, unspecified: Secondary | ICD-10-CM | POA: Diagnosis not present

## 2021-03-31 DIAGNOSIS — M109 Gout, unspecified: Secondary | ICD-10-CM | POA: Diagnosis not present

## 2021-03-31 DIAGNOSIS — D631 Anemia in chronic kidney disease: Secondary | ICD-10-CM | POA: Diagnosis not present

## 2021-03-31 LAB — COMPREHENSIVE METABOLIC PANEL
Albumin: 3.6 (ref 3.5–5.0)
Calcium: 9.3 (ref 8.7–10.7)

## 2021-03-31 LAB — MICROALBUMIN, URINE: Microalb, Ur: 107.2

## 2021-03-31 LAB — BASIC METABOLIC PANEL
BUN: 22 — AB (ref 4–21)
CO2: 26 — AB (ref 13–22)
Chloride: 112 — AB (ref 99–108)
Creatinine: 1.6 — AB (ref 0.6–1.3)
Glucose: 68
Potassium: 4.3 (ref 3.4–5.3)
Sodium: 146 (ref 137–147)

## 2021-04-02 ENCOUNTER — Other Ambulatory Visit: Payer: Self-pay | Admitting: Family Medicine

## 2021-04-02 DIAGNOSIS — E790 Hyperuricemia without signs of inflammatory arthritis and tophaceous disease: Secondary | ICD-10-CM

## 2021-04-02 NOTE — Telephone Encounter (Signed)
Requested medication (s) are due for refill today: yes  Requested medication (s) are on the active medication list: yes  Last refill:  03/02/21  Future visit scheduled: no  Notes to clinic:  Has already had a curtesy refill, labs are missing and out of date, and there is no upcoming appointment scheduled. Requested Prescriptions  Pending Prescriptions Disp Refills   allopurinol (ZYLOPRIM) 100 MG tablet [Pharmacy Med Name: ALLOPURINOL 100MG  TABLETS] 90 tablet     Sig: TAKE 1 TABLET BY MOUTH DAILY**NEED OFFICE VISIT**     Endocrinology:  Gout Agents Failed - 04/02/2021  3:51 PM      Failed - Uric Acid in normal range and within 360 days    No results found for: POCURA, LABURIC        Failed - Cr in normal range and within 360 days    Creatinine  Date Value Ref Range Status  10/15/2014 1.81 (H) mg/dL Final    Comment:    0.61-1.24 NOTE: New Reference Range  05/21/14    Creatinine, Ser  Date Value Ref Range Status  12/25/2019 1.60 (H) 0.76 - 1.27 mg/dL Final          Passed - Valid encounter within last 12 months    Recent Outpatient Visits           8 months ago AKI (acute kidney injury) ([place])   [facility] Juline Patch, MD   1 year ago Familial multiple lipoprotein-type hyperlipidemia   [facility] Juline Patch, MD   1 year ago Hospital discharge follow-up   [facility] Juline Patch, MD   1 year ago Hyperuricemia   [facility] Juline Patch, MD   2 years ago Coronary artery abnormality   [facility] Juline Patch, MD

## 2021-04-03 NOTE — Telephone Encounter (Signed)
Patient will follow up with an appointment after his wellness visit  with [facility]

## 2021-04-06 ENCOUNTER — Ambulatory Visit (INDEPENDENT_AMBULATORY_CARE_PROVIDER_SITE_OTHER): Payer: Medicare Other

## 2021-04-06 DIAGNOSIS — Z0000 Encounter for general adult medical examination without abnormal findings: Secondary | ICD-10-CM

## 2021-04-06 DIAGNOSIS — Z1211 Encounter for screening for malignant neoplasm of colon: Secondary | ICD-10-CM

## 2021-04-06 NOTE — Patient Instructions (Signed)
Christopher [name] , Thank you for taking time to come for your Medicare Wellness Visit. I appreciate your ongoing commitment to your health goals. Please review the following plan we discussed and let me know if I can assist you in the future.   Screening recommendations/referrals: Colonoscopy: done 09/25/19. Referral sent to [facility] today for repeat screening colonoscopy. They will contact you for an appointment.  Recommended yearly ophthalmology/optometry visit for glaucoma screening and checkup Recommended yearly dental visit for hygiene and checkup  Vaccinations: Influenza vaccine: done; need record Pneumococcal vaccine: done; need record Tdap vaccine: done 02/18/16 Shingles vaccine: done 09/30/17 & 12/16/17   Covid-19: done 04/26/19, 05/17/19  & 11/18/19  Advanced directives: Advance directive discussed with you today. I have provided a copy for you to complete at home and have notarized. Once this is complete please bring a copy in to our office so we can scan it into your chart.   Conditions/risks identified: Recommend increasing physical activity as tolerated.   Next appointment: Follow up in one year for your annual wellness visit.   Preventive Care 74 Years and Older, Male Preventive care refers to lifestyle choices and visits with your health care provider that can promote health and wellness. What does preventive care include? A yearly physical exam. This is also called an annual well check. Dental exams once or twice a year. Routine eye exams. Ask your health care provider how often you should have your eyes checked. Personal lifestyle choices, including: Daily care of your teeth and gums. Regular physical activity. Eating a healthy diet. Avoiding tobacco and drug use. Limiting alcohol use. Practicing safe sex. Taking low doses of aspirin every day. Taking vitamin and mineral supplements as recommended by your health care provider. What happens during an annual well  check? The services and screenings done by your health care provider during your annual well check will depend on your age, overall health, lifestyle risk factors, and family history of disease. Counseling  Your health care provider may ask you questions about your: Alcohol use. Tobacco use. Drug use. Emotional well-being. Home and relationship well-being. Sexual activity. Eating habits. History of falls. Memory and ability to understand (cognition). Work and work Statistician. Screening  You may have the following tests or measurements: Height, weight, and BMI. Blood pressure. Lipid and cholesterol levels. These may be checked every 5 years, or more frequently if you are over 63 years old. Skin check. Lung cancer screening. You may have this screening every year starting at age 28 if you have a 30-pack-year history of smoking and currently smoke or have quit within the past 15 years. Fecal occult blood test (FOBT) of the stool. You may have this test every year starting at age 43. Flexible sigmoidoscopy or colonoscopy. You may have a sigmoidoscopy every 5 years or a colonoscopy every 10 years starting at age 45. Prostate cancer screening. Recommendations will vary depending on your family history and other risks. Hepatitis C blood test. Hepatitis B blood test. Sexually transmitted disease (STD) testing. Diabetes screening. This is done by checking your blood sugar (glucose) after you have not eaten for a while (fasting). You may have this done every 1-3 years. Abdominal aortic aneurysm (AAA) screening. You may need this if you are a current or former smoker. Osteoporosis. You may be screened starting at age 22 if you are at high risk. Talk with your health care provider about your test results, treatment options, and if necessary, the need for more tests. Vaccines  Your health care provider may recommend certain vaccines, such as: Influenza vaccine. This is recommended every  year. Tetanus, diphtheria, and acellular pertussis (Tdap, Td) vaccine. You may need a Td booster every 10 years. Zoster vaccine. You may need this after age 76. Pneumococcal 13-valent conjugate (PCV13) vaccine. One dose is recommended after age 74. Pneumococcal polysaccharide (PPSV23) vaccine. One dose is recommended after age 74. Talk to your health care provider about which screenings and vaccines you need and how often you need them. This information is not intended to replace advice given to you by your health care provider. Make sure you discuss any questions you have with your health care provider. Document Released: 03/28/2015 Document Revised: 11/19/2015 Document Reviewed: 12/31/2014 Elsevier Interactive Patient Education  2017 [place] Prevention in the Home Falls can cause injuries. They can happen to people of all ages. There are many things you can do to make your home safe and to help prevent falls. What can I do on the outside of my home? Regularly fix the edges of walkways and driveways and fix any cracks. Remove anything that might make you trip as you walk through a door, such as a raised step or threshold. Trim any bushes or trees on the path to your home. Use bright outdoor lighting. Clear any walking paths of anything that might make someone trip, such as rocks or tools. Regularly check to see if handrails are loose or broken. Make sure that both sides of any steps have handrails. Any raised decks and porches should have guardrails on the edges. Have any leaves, snow, or ice cleared regularly. Use sand or salt on walking paths during winter. Clean up any spills in your garage right away. This includes oil or grease spills. What can I do in the bathroom? Use night lights. Install grab bars by the toilet and in the tub and shower. Do not use towel bars as grab bars. Use non-skid mats or decals in the tub or shower. If you need to sit down in the shower, use a  plastic, non-slip stool. Keep the floor dry. Clean up any water that spills on the floor as soon as it happens. Remove soap buildup in the tub or shower regularly. Attach bath mats securely with double-sided non-slip rug tape. Do not have throw rugs and other things on the floor that can make you trip. What can I do in the bedroom? Use night lights. Make sure that you have a light by your bed that is easy to reach. Do not use any sheets or blankets that are too big for your bed. They should not hang down onto the floor. Have a firm chair that has side arms. You can use this for support while you get dressed. Do not have throw rugs and other things on the floor that can make you trip. What can I do in the kitchen? Clean up any spills right away. Avoid walking on wet floors. Keep items that you use a lot in easy-to-reach places. If you need to reach something above you, use a strong step stool that has a grab bar. Keep electrical cords out of the way. Do not use floor polish or wax that makes floors slippery. If you must use wax, use non-skid floor wax. Do not have throw rugs and other things on the floor that can make you trip. What can I do with my stairs? Do not leave any items on the stairs. Make sure that there are  handrails on both sides of the stairs and use them. Fix handrails that are broken or loose. Make sure that handrails are as long as the stairways. Check any carpeting to make sure that it is firmly attached to the stairs. Fix any carpet that is loose or worn. Avoid having throw rugs at the top or bottom of the stairs. If you do have throw rugs, attach them to the floor with carpet tape. Make sure that you have a light switch at the top of the stairs and the bottom of the stairs. If you do not have them, ask someone to add them for you. What else can I do to help prevent falls? Wear shoes that: Do not have high heels. Have rubber bottoms. Are comfortable and fit you  well. Are closed at the toe. Do not wear sandals. If you use a stepladder: Make sure that it is fully opened. Do not climb a closed stepladder. Make sure that both sides of the stepladder are locked into place. Ask someone to hold it for you, if possible. Clearly mark and make sure that you can see: Any grab bars or handrails. First and last steps. Where the edge of each step is. Use tools that help you move around (mobility aids) if they are needed. These include: Canes. Walkers. Scooters. Crutches. Turn on the lights when you go into a dark area. Replace any light bulbs as soon as they burn out. Set up your furniture so you have a clear path. Avoid moving your furniture around. If any of your floors are uneven, fix them. If there are any pets around you, be aware of where they are. Review your medicines with your doctor. Some medicines can make you feel dizzy. This can increase your chance of falling. Ask your doctor what other things that you can do to help prevent falls. This information is not intended to replace advice given to you by your health care provider. Make sure you discuss any questions you have with your health care provider. Document Released: 12/26/2008 Document Revised: 08/07/2015 Document Reviewed: 04/05/2014 Elsevier Interactive Patient Education  2017 [employer].

## 2021-04-06 NOTE — Progress Notes (Signed)
Subjective:   Christopher [name] is a 76 y.o. male who presents for Medicare Annual/Subsequent preventive examination.  Virtual Visit via Telephone Note  I connected with  Christopher [name] on 04/06/21 at  2:40 PM EST by telephone and verified that I am speaking with the correct person using two identifiers.  Location: Patient: home Provider: [facility] Persons participating in the virtual visit: [place]   I discussed the limitations, risks, security and privacy concerns of performing an evaluation and management service by telephone and the availability of in person appointments. The patient expressed understanding and agreed to proceed.  Interactive audio and video telecommunications were attempted between this nurse and patient, however failed, due to patient having technical difficulties OR patient did not have access to video capability.  We continued and completed visit with audio only.  Some vital signs may be absent or patient reported.   Clemetine Marker, LPN   Review of Systems     Cardiac Risk Factors include: advanced age (>70men, >28 women);dyslipidemia;male gender;hypertension     Objective:    There were no vitals filed for this visit. There is no height or weight on file to calculate BMI.  Advanced Directives 04/06/2021 04/02/2020 09/25/2019 04/02/2019 12/12/2017 06/19/2017 10/22/2014  Does Patient Have a Medical Advance Directive? Yes Yes Yes Yes Yes Yes Yes  Type of Paramedic of [place];Living will [place];Living will [place];Living will [place];Living will [place] will  Does patient want to make changes to medical advance directive? Yes (MAU/Ambulatory/Procedural Areas - Information given) - No - Patient declined - No - Patient declined - -  Copy of [place] in Chart? No - copy  requested No - copy requested No - copy requested No - copy requested No - copy requested - No - copy requested    Current Medications (verified) Outpatient Encounter Medications as of 04/06/2021  Medication Sig   allopurinol (ZYLOPRIM) 100 MG tablet TAKE 1 TABLET(100 MG) BY MOUTH DAILY   atorvastatin (LIPITOR) 80 MG tablet TAKE 1 TABLET BY MOUTH DAILY   dapagliflozin propanediol (FARXIGA) 10 MG TABS tablet Take 1 tablet by mouth daily. [facility]   diclofenac sodium (VOLTAREN) 1 % GEL Apply 4 g topically 4 (four) times daily.   ENTRESTO 97-103 MG Take 1 tablet by mouth 2 (two) times daily.   fluorouracil (EFUDEX) 5 % cream Apply topically daily.   metoprolol (TOPROL-XL) 200 MG 24 hr tablet Take 1 tablet by mouth daily. cardio   sodium bicarbonate 650 MG tablet Take 650 mg by mouth 2 (two) times daily.   spironolactone (ALDACTONE) 25 MG tablet Take 1 tablet by mouth daily. cardio   torsemide (DEMADEX) 20 MG tablet Take 20 mg by mouth as needed. cardio   XARELTO 20 MG TABS tablet Take 20 mg by mouth daily.   [DISCONTINUED] sertraline (ZOLOFT) 25 MG tablet TAKE 1 TABLET(25 MG) BY MOUTH DAILY   No facility-administered encounter medications on file as of 04/06/2021.    Allergies (verified) Patient has no known allergies.   History: Past Medical History:  Diagnosis Date   Arthritis    ankles, hands, left knee   CHF (congestive heart failure) (HCC)    Chronic kidney disease (CKD), stage [name] (moderate) (HCC)    Coronary artery disease    Gout    Hyperlipidemia    Hypertension    Myocardial infarction (  [place]) 2000   TIA (transient ischemic attack) 2003   Wears dentures    full upper   Past Surgical History:  Procedure Laterality Date   CARDIAC CATHETERIZATION  2000   stent    COLONOSCOPY  2011   COLONOSCOPY WITH PROPOFOL N/A 09/25/2019   Procedure: COLONOSCOPY WITH PROPOFOL;  Surgeon: Virgel Manifold, MD;  Location: [place];  Service: Endoscopy;  Laterality: N/A;   priority 4   JOINT REPLACEMENT Right    knee   KNEE ARTHROSCOPY Right 2016   POLYPECTOMY N/A 09/25/2019   Procedure: POLYPECTOMY;  Surgeon: Virgel Manifold, MD;  Location: [place];  Service: Endoscopy;  Laterality: N/A;   Family History  Problem Relation Age of Onset   Cancer Father    Heart disease Father    Diabetes Paternal Grandmother    Social History   Socioeconomic History   Marital status: Widowed    Spouse name: Not on file   Number of children: 2   Years of education: Not on file   Highest education level: Not on file  Occupational History   Not on file  Tobacco Use   Smoking status: Former    Types: Cigarettes    Quit date: 75    Years since quitting: 41.0   Smokeless tobacco: Never  Vaping Use   Vaping Use: Never used  Substance and Sexual Activity   Alcohol use: No    Alcohol/week: 0.0 standard drinks   Drug use: No   Sexual activity: Never  Other Topics Concern   Not on file  Social History Narrative   Pt lives with his son, daughter in Sports coach and 2 grandchildren   Social Determinants of Health   Financial Resource Strain: Low Risk    Difficulty of Paying Living Expenses: Not hard at all  Food Insecurity: No Food Insecurity   Worried About Charity fundraiser in the Last Year: Never true   Arboriculturist in the Last Year: Never true  Transportation Needs: No Transportation Needs   Lack of Transportation (Medical): No   Lack of Transportation (Non-Medical): No  Physical Activity: Insufficiently Active   Days of Exercise per Week: 4 days   Minutes of Exercise per Session: 20 min  Stress: No Stress Concern Present   Feeling of Stress : Not at all  Social Connections: Socially Isolated   Frequency of Communication with Friends and Family: More than three times a week   Frequency of Social Gatherings with Friends and Family: More than three times a week   Attends Religious Services: Never   Marine scientist or Organizations:  No   Attends Archivist Meetings: Never   Marital Status: Widowed    Tobacco Counseling Counseling given: Not Answered   Clinical Intake:  Pre-visit preparation completed: Yes  Pain : No/denies pain     Nutritional Risks: None Diabetes: No  How often do you need to have someone help you when you read instructions, pamphlets, or other written materials from your doctor or pharmacy?: 1 - Never    Interpreter Needed?: No  Information entered by :: Clemetine Marker LPN   Activities of Daily Living In your present state of health, do you have any difficulty performing the following activities: 04/06/2021  Hearing? N  Vision? N  Difficulty concentrating or making decisions? N  Walking or climbing stairs? N  Dressing or bathing? N  Doing errands, shopping? N  Preparing Food and eating ? N  Using the Toilet? N  In the past six months, have you accidently leaked urine? N  Do you have problems with loss of bowel control? N  Managing your Medications? N  Managing your Finances? N  Housekeeping or managing your Housekeeping? N  Some recent data might be hidden    Patient Care Team: Juline Patch, MD as PCP - General (Family Medicine) Rolene Course, MD as Referring Physician (Cardiology) Clarice Pole, MD as Referring Physician (Nephrology)  Indicate any recent Medical Services you may have received from other than [facility] providers in the past year (date may be approximate).     Assessment:   This is a routine wellness examination for [place].  Hearing/Vision screen Hearing Screening - Comments:: Pt denies hearing difficulty Vision Screening - Comments:: Past due for eye exam; plans to establish care with new provider  Dietary issues and exercise activities discussed: Current Exercise Habits: Home exercise routine, Type of exercise: walking, Time (Minutes): 20, Frequency (Times/Week): 4, Weekly Exercise (Minutes/Week): 80, Intensity: Mild, Exercise limited by:  orthopedic condition(s)   Goals Addressed   None    Depression Screen PHQ 2/9 Scores 04/06/2021 07/18/2020 04/02/2020 02/22/2020 12/25/2019 08/22/2019 04/02/2019  PHQ - 2 Score 0 0 0 0 0 0 0  PHQ- 9 Score - 0 - 0 0 0 -    Fall Risk Fall Risk  04/06/2021 04/02/2020 02/22/2020 12/25/2019 08/22/2019  Falls in the past year? 0 1 1 1  0  Number falls in past yr: 0 0 0 0 -  Injury with Fall? 0 1 1 1  -  Comment - - - fx vertebrae -  Risk for fall due to : No Fall Risks History of fall(s);Orthopedic patient - - -  Follow up Falls prevention discussed - Falls evaluation completed Falls evaluation completed Falls evaluation completed    [place]:  Any stairs in or around the home? Yes  If so, are there any without handrails? No  Home free of loose throw rugs in walkways, pet beds, electrical cords, etc? Yes  Adequate lighting in your home to reduce risk of falls? Yes   ASSISTIVE DEVICES UTILIZED TO PREVENT FALLS:  Life alert? No  Use of a cane, walker or w/c? No  Grab bars in the bathroom? Yes  Shower chair or bench in shower? Yes  Elevated toilet seat or a handicapped toilet? Yes   TIMED UP AND GO:  Was the test performed? No . Telephonic visit.   Cognitive Function: Normal cognitive status assessed by direct observation by this Nurse Health Advisor. No abnormalities found.          Immunizations Immunization History  Administered Date(s) Administered   Fluad Quad(high Dose 65+) 12/25/2019   Influenza, High Dose Seasonal PF 11/18/2016, 01/01/2019   Influenza,inj,Quad PF,6+ Mos 11/04/2017, 11/14/2018   Influenza-Unspecified 11/13/2016   PFIZER(Purple Top)SARS-COV-2 Vaccination 04/26/2019, 05/17/2019, 11/18/2019   Pneumococcal Conjugate-13 02/18/2016   Pneumococcal Polysaccharide-23 12/01/2012, 12/16/2016   Tdap 12/01/2012, 02/18/2016   Zoster Recombinat (Shingrix) 09/30/2017, 12/16/2017    TDAP status: Up to date  Flu Vaccine status: Up to  date per patient needs records  Pneumococcal vaccine status: Up to date  Covid-19 vaccine status: Completed vaccines  Qualifies for Shingles Vaccine? Yes   Zostavax completed No   Shingrix Completed?: Yes  Screening Tests Health Maintenance  Topic Date Due   COVID-19 Vaccine (4 - Booster for Pfizer series) 01/13/2020   COLON CANCER SCREENING ANNUAL FOBT  08/26/2020  INFLUENZA VACCINE  10/13/2020   TETANUS/TDAP  02/17/2026   COLONOSCOPY (Pts 45-3yrs Insurance coverage will need to be confirmed)  09/24/2029   Pneumonia Vaccine 21+ Years old  Completed   Hepatitis C Screening  Completed   Zoster Vaccines- Shingrix  Completed   HPV VACCINES  Aged Out    Health Maintenance  Health Maintenance Due  Topic Date Due   COVID-19 Vaccine (4 - Booster for Pfizer series) 01/13/2020   COLON CANCER SCREENING ANNUAL FOBT  08/26/2020   INFLUENZA VACCINE  10/13/2020    Colorectal cancer screening: Type of screening: Colonoscopy. Completed 09/25/19. Repeat every 6 months years. Referral sent to [facility] gastroenterology today for repeat screening colonoscopy.   Lung Cancer Screening: (Low Dose CT Chest recommended if Age 19-80 years, 30 pack-year currently smoking OR have quit w/in 15years.) does not qualify.   Additional Screening:  Hepatitis C Screening: does qualify; Completed 02/18/16  Vision Screening: Recommended annual ophthalmology exams for early detection of glaucoma and other disorders of the eye. Is the patient up to date with their annual eye exam?  No  Who is the provider or what is the name of the office in which the patient attends annual eye exams? Not established If pt is not established with a provider, would they like to be referred to a provider to establish care? No .   Dental Screening: Recommended annual dental exams for proper oral hygiene  Community Resource Referral / Chronic Care Management: CRR required this visit?  No   CCM required this visit?  No       Plan:     I have personally reviewed and noted the following in the patients chart:   Medical and social history Use of alcohol, tobacco or illicit drugs  Current medications and supplements including opioid prescriptions. Patient is not currently taking opioid prescriptions. Functional ability and status Nutritional status Physical activity Advanced directives List of other physicians Hospitalizations, surgeries, and ER visits in previous 12 months Vitals Screenings to include cognitive, depression, and falls Referrals and appointments  In addition, I have reviewed and discussed with patient certain preventive protocols, quality metrics, and best practice recommendations. A written personalized care plan for preventive services as well as general preventive health recommendations were provided to patient.     Clemetine Marker, LPN   [id number]   Nurse Notes: pt advised due for office visit with Dr. Ronnald Ramp.

## 2021-04-07 ENCOUNTER — Telehealth: Payer: Self-pay

## 2021-04-07 NOTE — Telephone Encounter (Signed)
CALLED PATIENT NO ANSWER LEFT VOICEMAIL FOR A CALL BACK ? ?

## 2021-04-08 ENCOUNTER — Telehealth: Payer: Self-pay

## 2021-04-08 NOTE — Telephone Encounter (Signed)
CALLED PATIENT NO ANSWER LEFT VOICEMAIL FOR A CALL BACK °Letter sent °

## 2021-05-14 ENCOUNTER — Ambulatory Visit: Payer: Medicare Other | Admitting: Family Medicine

## 2021-05-18 ENCOUNTER — Encounter: Payer: Self-pay | Admitting: Family Medicine

## 2021-05-18 ENCOUNTER — Other Ambulatory Visit: Payer: Self-pay

## 2021-05-18 ENCOUNTER — Ambulatory Visit (INDEPENDENT_AMBULATORY_CARE_PROVIDER_SITE_OTHER): Payer: Medicare Other | Admitting: Family Medicine

## 2021-05-18 VITALS — BP 110/74 | HR 72 | Ht 75.0 in | Wt 240.0 lb

## 2021-05-18 DIAGNOSIS — E7849 Other hyperlipidemia: Secondary | ICD-10-CM

## 2021-05-18 DIAGNOSIS — E790 Hyperuricemia without signs of inflammatory arthritis and tophaceous disease: Secondary | ICD-10-CM | POA: Diagnosis not present

## 2021-05-18 MED ORDER — ATORVASTATIN CALCIUM 80 MG PO TABS: 80.0000 mg | ORAL_TABLET | Freq: Every day | ORAL | 1 refills | Status: DC

## 2021-05-18 MED ORDER — ALLOPURINOL 100 MG PO TABS: ORAL_TABLET | ORAL | 1 refills | Status: DC

## 2021-05-18 NOTE — Progress Notes (Signed)
Date:  05/18/2021   Name:  Christopher [name]   DOB:  1945-05-29   MRN:  [id number]   Chief Complaint: Hyperlipidemia and Gout  Hyperlipidemia This is a chronic problem. The current episode started more than 1 year ago. The problem is controlled. Recent lipid tests were reviewed and are normal. He has no history of chronic renal disease, diabetes, hypothyroidism, liver disease, obesity or nephrotic syndrome. There are no known factors aggravating his hyperlipidemia. Pertinent negatives include no focal sensory loss, focal weakness, leg pain or shortness of breath. Current antihyperlipidemic treatment includes statins. The current treatment provides moderate improvement of lipids. There are no compliance problems.  Risk factors for coronary artery disease include dyslipidemia.  Other Chronicity: for hyperuric acid. The current episode started more than 1 year ago. The problem has been gradually improving. Pertinent negatives include no arthralgias, coughing or joint swelling. Exacerbated by: diet /stable. Treatments tried: allopurinol.   Lab Results  Component Value Date   NA 146 03/31/2021   K 4.3 03/31/2021   CO2 26 (A) 03/31/2021   GLUCOSE 84 12/25/2019   BUN 22 (A) 03/31/2021   CREATININE 1.6 (A) 03/31/2021   CALCIUM 9.3 03/31/2021   GFRNONAA 42 (L) 12/25/2019   Lab Results  Component Value Date   CHOL 101 02/22/2020   HDL 26 (L) 02/22/2020   LDLCALC 53 02/22/2020   TRIG 117 02/22/2020   CHOLHDL 5.0 01/24/2018   Lab Results  Component Value Date   TSH 3.423 06/12/2014   Lab Results  Component Value Date   HGBA1C 4.8 07/08/2020   Lab Results  Component Value Date   WBC 10.5 12/25/2019   HGB 12.8 (L) 12/25/2019   HCT 38.4 12/25/2019   MCV 90 12/25/2019   PLT 244 12/25/2019   Lab Results  Component Value Date   ALT 18 12/16/2016   AST 18 12/16/2016   ALKPHOS 96 12/16/2016   BILITOT 0.5 12/16/2016   No results found for: 25OHVITD2, 25OHVITD3, VD25OH   Review  of Systems  HENT:  Negative for drooling, ear discharge and ear pain.   Respiratory:  Negative for cough, shortness of breath and wheezing.   Cardiovascular:  Negative for palpitations and leg swelling.  Gastrointestinal:  Negative for blood in stool, constipation and diarrhea.  Endocrine: Negative for polydipsia.  Genitourinary:  Negative for dysuria, frequency, hematuria and urgency.  Musculoskeletal:  Negative for arthralgias, back pain and joint swelling.  Allergic/Immunologic: Negative for environmental allergies.  Neurological:  Negative for dizziness and focal weakness.  Hematological:  Does not bruise/bleed easily.  Psychiatric/Behavioral:  Negative for suicidal ideas. The patient is not nervous/anxious.    Patient Active Problem List   Diagnosis Date Noted   Severe pulmonary hypertension ([place]) 98/92/1194   Metabolic acidosis 17/40/8144   Hyponatremia 07/07/2020   Hyperkalemia 11/14/2019   Family history of colon cancer    Polyp of colon    Ischemic cardiomyopathy 12/19/2018   Acute renal failure superimposed on stage 3 chronic kidney disease ([place]) 10/14/2018   Atrial flutter ([place]) 81/85/6314   Chronic systolic heart failure ([place]) 10/14/2018   History of TIA (transient ischemic attack) 10/14/2018   Volume overload 10/14/2018   Cerebral vascular disease 09/08/2018   TIA (transient ischemic attack) 12/12/2017   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   Encounter for general adult medical examination without abnormal findings 10/22/2014   Disorder of kidney and ureter 97/04/6376   Renal colic 58/85/0277   Coronary artery abnormality 10/22/2014  Creatinine elevation 10/22/2014   Essential (primary) hypertension 10/22/2014   H/O hypercholesterolemia 90/24/0973   Dysmetabolic syndrome 53/29/9242   H/O acute myocardial infarction 10/22/2014   Adiposity 10/22/2014   Impaired renal function 10/22/2014   Need for vaccination 10/22/2014   Status post total  right knee replacement 06/20/2014   S/P coronary artery stent placement 04/09/2014   Primary osteoarthritis of right knee 03/26/2014   Chronic kidney disease, stage [name] (moderate) ([place]) 01/25/2011    No Known Allergies  Past Surgical History:  Procedure Laterality Date   CARDIAC CATHETERIZATION  2000   stent    COLONOSCOPY  2011   COLONOSCOPY WITH PROPOFOL N/A 09/25/2019   Procedure: COLONOSCOPY WITH PROPOFOL;  Surgeon: Virgel Manifold, MD;  Location: [place];  Service: Endoscopy;  Laterality: N/A;  priority 4   JOINT REPLACEMENT Right    knee   KNEE ARTHROSCOPY Right 2016   POLYPECTOMY N/A 09/25/2019   Procedure: POLYPECTOMY;  Surgeon: Virgel Manifold, MD;  Location: [place];  Service: Endoscopy;  Laterality: N/A;    Social History   Tobacco Use   Smoking status: Former    Types: Cigarettes    Quit date: 1982    Years since quitting: 41.2   Smokeless tobacco: Never  Vaping Use   Vaping Use: Never used  Substance Use Topics   Alcohol use: No    Alcohol/week: 0.0 standard drinks   Drug use: No     Medication list has been reviewed and updated.  Current Meds  Medication Sig   allopurinol (ZYLOPRIM) 100 MG tablet TAKE 1 TABLET(100 MG) BY MOUTH DAILY   atorvastatin (LIPITOR) 80 MG tablet TAKE 1 TABLET BY MOUTH DAILY   dapagliflozin propanediol (FARXIGA) 10 MG TABS tablet Take 1 tablet by mouth daily. [facility]   diclofenac sodium (VOLTAREN) 1 % GEL Apply 4 g topically 4 (four) times daily.   ENTRESTO 97-103 MG Take 1 tablet by mouth 2 (two) times daily.   metoprolol (TOPROL-XL) 200 MG 24 hr tablet Take 1 tablet by mouth daily. cardio   sodium bicarbonate 650 MG tablet Take 650 mg by mouth 2 (two) times daily.   spironolactone (ALDACTONE) 25 MG tablet Take 1 tablet by mouth daily. cardio   XARELTO 20 MG TABS tablet Take 20 mg by mouth daily.   [DISCONTINUED] fluorouracil (EFUDEX) 5 % cream Apply topically daily.   [DISCONTINUED] torsemide  (DEMADEX) 20 MG tablet Take 20 mg by mouth as needed. cardio    PHQ 2/9 Scores 05/18/2021 04/06/2021 07/18/2020 04/02/2020  PHQ - 2 Score 0 0 0 0  PHQ- 9 Score 0 - 0 -    GAD 7 : Generalized Anxiety Score 05/18/2021 07/18/2020 02/22/2020 12/25/2019  Nervous, Anxious, on Edge 0 0 0 0  Control/stop worrying 0 0 0 0  Worry too much - different things 0 0 0 0  Trouble relaxing 0 0 0 0  Restless 0 0 0 0  Easily annoyed or irritable 0 0 0 0  Afraid - awful might happen 0 0 0 0  Total GAD 7 Score 0 0 0 0  Anxiety Difficulty Not difficult at all - - -    BP Readings from Last 3 Encounters:  05/18/21 110/74  07/18/20 112/70  02/22/20 110/62    Physical Exam Vitals and nursing note reviewed.  HENT:     Head: Normocephalic.     Right Ear: Tympanic membrane, ear canal and external ear normal.     Left Ear: Tympanic  membrane, ear canal and external ear normal.     Nose: Nose normal. No congestion or rhinorrhea.  Eyes:     General: No scleral icterus.       Right eye: No discharge.        Left eye: No discharge.     Conjunctiva/sclera: Conjunctivae normal.     Pupils: Pupils are equal, round, and reactive to light.  Neck:     Thyroid: No thyromegaly.     Vascular: No JVD.     Trachea: No tracheal deviation.  Cardiovascular:     Rate and Rhythm: Normal rate and regular rhythm.     Heart sounds: Normal heart sounds, S1 normal and S2 normal. No murmur heard. No systolic murmur is present.  No diastolic murmur is present.    No friction rub. No gallop. No S3 or S4 sounds.  Pulmonary:     Effort: No respiratory distress.     Breath sounds: Normal breath sounds. No wheezing, rhonchi or rales.  Abdominal:     General: Bowel sounds are normal.     Palpations: Abdomen is soft. There is no mass.     Tenderness: There is no abdominal tenderness. There is no guarding or rebound.  Musculoskeletal:        General: Normal range of motion.     Cervical back: Normal range of motion and neck supple.   Lymphadenopathy:     Cervical: No cervical adenopathy.  Skin:    General: Skin is warm.  Neurological:     Mental Status: He is alert.     Deep Tendon Reflexes: Reflexes are normal and symmetric.    Wt Readings from Last 3 Encounters:  05/18/21 240 lb (108.9 kg)  07/18/20 238 lb (108 kg)  02/22/20 256 lb (116.1 kg)    BP 110/74    Pulse 72    Ht '6\' 3"'$  (1.905 m)    Wt 240 lb (108.9 kg)    BMI 30.00 kg/m   Assessment and Plan:  1. Familial multiple lipoprotein-type hyperlipidemia Chronic.  Controlled.  Stable.  Continue atorvastatin 80 mg once a day.  Will check lipid panel for current lipid/LDL status. - allopurinol (ZYLOPRIM) 100 MG tablet; Take 1 tablet by mouth daily  Dispense: 90 tablet; Refill: 1 - atorvastatin (LIPITOR) 80 MG tablet; Take 1 tablet (80 mg total) by mouth daily.  Dispense: 90 tablet; Refill: 1 - Lipid Panel With LDL/HDL Ratio  2. Hyperuricemia Chronic.  Controlled.  Stable.  Continue with allopurinol 100 mg once a day.  And check current uric acid level. - allopurinol (ZYLOPRIM) 100 MG tablet; Take 1 tablet by mouth daily  Dispense: 90 tablet; Refill: 1 - Hepatic function panel - Uric acid

## 2021-05-19 LAB — LIPID PANEL WITH LDL/HDL RATIO
Cholesterol, Total: 94 mg/dL — ABNORMAL LOW (ref 100–199)
HDL: 27 mg/dL — ABNORMAL LOW (ref >39)
LDL Chol Calc (NIH): 47 mg/dL (ref 0–99)
LDL/HDL Ratio: 1.7 ratio (ref 0.0–3.6)
Triglycerides: 108 mg/dL (ref 0–149)
VLDL Cholesterol Cal: 20 mg/dL (ref 5–40)

## 2021-05-19 LAB — HEPATIC FUNCTION PANEL
ALT: 37 IU/L (ref 0–44)
AST: 31 IU/L (ref 0–40)
Albumin: 3.8 g/dL (ref 3.7–4.7)
Alkaline Phosphatase: 176 IU/L — ABNORMAL HIGH (ref 44–121)
Bilirubin Total: 0.3 mg/dL (ref 0.0–1.2)
Bilirubin, Direct: <0.1 mg/dL (ref 0.00–0.40)
Total Protein: 6.6 g/dL (ref 6.0–8.5)

## 2021-05-19 LAB — URIC ACID: Uric Acid: 5.7 mg/dL (ref 3.8–8.4)

## 2021-06-22 DIAGNOSIS — I10 Essential (primary) hypertension: Secondary | ICD-10-CM | POA: Diagnosis not present

## 2021-06-22 DIAGNOSIS — E782 Mixed hyperlipidemia: Secondary | ICD-10-CM | POA: Diagnosis not present

## 2021-06-22 DIAGNOSIS — I2510 Atherosclerotic heart disease of native coronary artery without angina pectoris: Secondary | ICD-10-CM | POA: Diagnosis not present

## 2021-06-22 DIAGNOSIS — I483 Typical atrial flutter: Secondary | ICD-10-CM | POA: Diagnosis not present

## 2021-06-22 DIAGNOSIS — I5022 Chronic systolic (congestive) heart failure: Secondary | ICD-10-CM | POA: Diagnosis not present

## 2021-06-22 DIAGNOSIS — Z8673 Personal history of transient ischemic attack (TIA), and cerebral infarction without residual deficits: Secondary | ICD-10-CM | POA: Diagnosis not present

## 2021-06-22 DIAGNOSIS — N1831 Chronic kidney disease, stage 3a: Secondary | ICD-10-CM | POA: Diagnosis not present

## 2021-07-12 DIAGNOSIS — R509 Fever, unspecified: Secondary | ICD-10-CM | POA: Diagnosis not present

## 2021-07-12 DIAGNOSIS — R197 Diarrhea, unspecified: Secondary | ICD-10-CM | POA: Diagnosis not present

## 2021-07-12 DIAGNOSIS — E785 Hyperlipidemia, unspecified: Secondary | ICD-10-CM | POA: Diagnosis not present

## 2021-07-12 DIAGNOSIS — E860 Dehydration: Secondary | ICD-10-CM | POA: Diagnosis not present

## 2021-07-12 DIAGNOSIS — Z7984 Long term (current) use of oral hypoglycemic drugs: Secondary | ICD-10-CM | POA: Diagnosis not present

## 2021-07-12 DIAGNOSIS — E871 Hypo-osmolality and hyponatremia: Secondary | ICD-10-CM | POA: Diagnosis not present

## 2021-07-12 DIAGNOSIS — E872 Acidosis, unspecified: Secondary | ICD-10-CM | POA: Diagnosis not present

## 2021-07-12 DIAGNOSIS — Z791 Long term (current) use of non-steroidal anti-inflammatories (NSAID): Secondary | ICD-10-CM | POA: Diagnosis not present

## 2021-07-12 DIAGNOSIS — E8729 Other acidosis: Secondary | ICD-10-CM | POA: Diagnosis not present

## 2021-07-12 DIAGNOSIS — R5381 Other malaise: Secondary | ICD-10-CM | POA: Diagnosis not present

## 2021-07-12 DIAGNOSIS — N1831 Chronic kidney disease, stage 3a: Secondary | ICD-10-CM | POA: Diagnosis not present

## 2021-07-12 DIAGNOSIS — N189 Chronic kidney disease, unspecified: Secondary | ICD-10-CM | POA: Diagnosis not present

## 2021-07-12 DIAGNOSIS — N179 Acute kidney failure, unspecified: Secondary | ICD-10-CM | POA: Diagnosis not present

## 2021-07-12 DIAGNOSIS — E8721 Acute metabolic acidosis: Secondary | ICD-10-CM | POA: Diagnosis not present

## 2021-07-12 DIAGNOSIS — D6959 Other secondary thrombocytopenia: Secondary | ICD-10-CM | POA: Diagnosis not present

## 2021-07-12 DIAGNOSIS — E873 Alkalosis: Secondary | ICD-10-CM | POA: Diagnosis not present

## 2021-07-12 DIAGNOSIS — E1122 Type 2 diabetes mellitus with diabetic chronic kidney disease: Secondary | ICD-10-CM | POA: Diagnosis not present

## 2021-07-12 DIAGNOSIS — E861 Hypovolemia: Secondary | ICD-10-CM | POA: Diagnosis not present

## 2021-07-12 DIAGNOSIS — I2510 Atherosclerotic heart disease of native coronary artery without angina pectoris: Secondary | ICD-10-CM | POA: Diagnosis not present

## 2021-07-12 DIAGNOSIS — R571 Hypovolemic shock: Secondary | ICD-10-CM | POA: Diagnosis not present

## 2021-07-12 DIAGNOSIS — I272 Pulmonary hypertension, unspecified: Secondary | ICD-10-CM | POA: Diagnosis not present

## 2021-07-12 DIAGNOSIS — I4892 Unspecified atrial flutter: Secondary | ICD-10-CM | POA: Diagnosis not present

## 2021-07-12 DIAGNOSIS — N178 Other acute kidney failure: Secondary | ICD-10-CM | POA: Diagnosis not present

## 2021-07-12 DIAGNOSIS — N183 Chronic kidney disease, stage 3 unspecified: Secondary | ICD-10-CM | POA: Diagnosis not present

## 2021-07-12 DIAGNOSIS — I130 Hypertensive heart and chronic kidney disease with heart failure and stage 1 through stage 4 chronic kidney disease, or unspecified chronic kidney disease: Secondary | ICD-10-CM | POA: Diagnosis not present

## 2021-07-12 DIAGNOSIS — I252 Old myocardial infarction: Secondary | ICD-10-CM | POA: Diagnosis not present

## 2021-07-12 DIAGNOSIS — I5020 Unspecified systolic (congestive) heart failure: Secondary | ICD-10-CM | POA: Diagnosis not present

## 2021-07-12 DIAGNOSIS — Z87891 Personal history of nicotine dependence: Secondary | ICD-10-CM | POA: Diagnosis not present

## 2021-07-12 DIAGNOSIS — D696 Thrombocytopenia, unspecified: Secondary | ICD-10-CM | POA: Diagnosis not present

## 2021-07-12 DIAGNOSIS — M109 Gout, unspecified: Secondary | ICD-10-CM | POA: Diagnosis not present

## 2021-07-12 DIAGNOSIS — E875 Hyperkalemia: Secondary | ICD-10-CM | POA: Diagnosis not present

## 2021-07-12 DIAGNOSIS — Z8673 Personal history of transient ischemic attack (TIA), and cerebral infarction without residual deficits: Secondary | ICD-10-CM | POA: Diagnosis not present

## 2021-07-12 DIAGNOSIS — I5022 Chronic systolic (congestive) heart failure: Secondary | ICD-10-CM | POA: Diagnosis not present

## 2021-07-12 DIAGNOSIS — I959 Hypotension, unspecified: Secondary | ICD-10-CM | POA: Diagnosis not present

## 2021-07-13 DIAGNOSIS — I959 Hypotension, unspecified: Secondary | ICD-10-CM | POA: Diagnosis not present

## 2021-07-13 DIAGNOSIS — N183 Chronic kidney disease, stage 3 unspecified: Secondary | ICD-10-CM | POA: Diagnosis not present

## 2021-07-13 DIAGNOSIS — N179 Acute kidney failure, unspecified: Secondary | ICD-10-CM | POA: Diagnosis not present

## 2021-07-21 ENCOUNTER — Telehealth: Payer: Self-pay

## 2021-07-21 NOTE — Telephone Encounter (Signed)
Transition Care Management Unsuccessful Follow-up Telephone Call ? ?Date of discharge and from where:  07/20/21 [facility] ? ?Attempts:  1st Attempt ? ?Reason for unsuccessful TCM follow-up call:  Left voice message ? ? ? ?

## 2021-07-21 NOTE — Telephone Encounter (Signed)
Copied from [place]. Topic: General - Other ?>> Jul 21, 2021 12:25 PM Oneta Rack wrote: ?Reason for CRM: Caller would like to know if PCP will be over seeing patient care if skilled nursing, PT or OT is needed. Caller would like a follow up call ?

## 2021-07-21 NOTE — Telephone Encounter (Signed)
Transition Care Management Follow-up Telephone Call ?Date of discharge and from where: 07/20/21 [facility] ?How have you been since you were released from the hospital? Pt states he is doing well ?Any questions or concerns? No ? ?Items Reviewed: ?Did the pt receive and understand the discharge instructions provided? Yes  ?Medications obtained and verified? Yes  ?Other? No  ?Any new allergies since your discharge? No  ?Dietary orders reviewed? Yes ?Do you have support at home? Yes  ? ?Home Care and Equipment/Supplies: ?Were home health services ordered? no ? ?Were any new equipment or medical supplies ordered?  No ? ?Functional Questionnaire: (I = Independent and D = Dependent) ?ADLs: I ? ?Bathing/Dressing- I ? ?Meal Prep- I ? ?Eating- I ? ?Maintaining continence- I ? ?Transferring/Ambulation- I ? ?Managing Meds- I ? ?Follow up appointments reviewed: ? ?PCP Hospital f/u appt confirmed? Yes  Scheduled to see Dr. Ronnald Ramp on 07/27/21 @ 1:20. ?[facility] f/u appt confirmed? Yes  Scheduled to see nephrology on 08/04/21. ?Are transportation arrangements needed? No  ?If their condition worsens, is the pt aware to call PCP or go to the Emergency Dept.? Yes ?Was the patient provided with contact information for the PCP's office or ED? Yes ?Was to pt encouraged to call back with questions or concerns? Yes  ?

## 2021-07-21 NOTE — Telephone Encounter (Signed)
Called Pam with needing to get [facility] to follow the home health orders for pt ?

## 2021-07-22 DIAGNOSIS — M1030 Gout due to renal impairment, unspecified site: Secondary | ICD-10-CM | POA: Diagnosis not present

## 2021-07-22 DIAGNOSIS — D696 Thrombocytopenia, unspecified: Secondary | ICD-10-CM | POA: Diagnosis not present

## 2021-07-22 DIAGNOSIS — Z8673 Personal history of transient ischemic attack (TIA), and cerebral infarction without residual deficits: Secondary | ICD-10-CM | POA: Diagnosis not present

## 2021-07-22 DIAGNOSIS — Z7982 Long term (current) use of aspirin: Secondary | ICD-10-CM | POA: Diagnosis not present

## 2021-07-22 DIAGNOSIS — I130 Hypertensive heart and chronic kidney disease with heart failure and stage 1 through stage 4 chronic kidney disease, or unspecified chronic kidney disease: Secondary | ICD-10-CM | POA: Diagnosis not present

## 2021-07-22 DIAGNOSIS — Z9181 History of falling: Secondary | ICD-10-CM | POA: Diagnosis not present

## 2021-07-22 DIAGNOSIS — I4892 Unspecified atrial flutter: Secondary | ICD-10-CM | POA: Diagnosis not present

## 2021-07-22 DIAGNOSIS — Z96651 Presence of right artificial knee joint: Secondary | ICD-10-CM | POA: Diagnosis not present

## 2021-07-22 DIAGNOSIS — N1831 Chronic kidney disease, stage 3a: Secondary | ICD-10-CM | POA: Diagnosis not present

## 2021-07-22 DIAGNOSIS — I272 Pulmonary hypertension, unspecified: Secondary | ICD-10-CM | POA: Diagnosis not present

## 2021-07-22 DIAGNOSIS — Z981 Arthrodesis status: Secondary | ICD-10-CM | POA: Diagnosis not present

## 2021-07-22 DIAGNOSIS — I2510 Atherosclerotic heart disease of native coronary artery without angina pectoris: Secondary | ICD-10-CM | POA: Diagnosis not present

## 2021-07-22 DIAGNOSIS — E872 Acidosis, unspecified: Secondary | ICD-10-CM | POA: Diagnosis not present

## 2021-07-22 DIAGNOSIS — I5022 Chronic systolic (congestive) heart failure: Secondary | ICD-10-CM | POA: Diagnosis not present

## 2021-07-22 DIAGNOSIS — I255 Ischemic cardiomyopathy: Secondary | ICD-10-CM | POA: Diagnosis not present

## 2021-07-22 DIAGNOSIS — Z87891 Personal history of nicotine dependence: Secondary | ICD-10-CM | POA: Diagnosis not present

## 2021-07-22 DIAGNOSIS — E1122 Type 2 diabetes mellitus with diabetic chronic kidney disease: Secondary | ICD-10-CM | POA: Diagnosis not present

## 2021-07-22 DIAGNOSIS — Z955 Presence of coronary angioplasty implant and graft: Secondary | ICD-10-CM | POA: Diagnosis not present

## 2021-07-22 DIAGNOSIS — R571 Hypovolemic shock: Secondary | ICD-10-CM | POA: Diagnosis not present

## 2021-07-22 DIAGNOSIS — N179 Acute kidney failure, unspecified: Secondary | ICD-10-CM | POA: Diagnosis not present

## 2021-07-22 DIAGNOSIS — E782 Mixed hyperlipidemia: Secondary | ICD-10-CM | POA: Diagnosis not present

## 2021-07-22 DIAGNOSIS — Z7901 Long term (current) use of anticoagulants: Secondary | ICD-10-CM | POA: Diagnosis not present

## 2021-07-24 DIAGNOSIS — Z8673 Personal history of transient ischemic attack (TIA), and cerebral infarction without residual deficits: Secondary | ICD-10-CM | POA: Diagnosis not present

## 2021-07-24 DIAGNOSIS — E782 Mixed hyperlipidemia: Secondary | ICD-10-CM | POA: Diagnosis not present

## 2021-07-24 DIAGNOSIS — Z7982 Long term (current) use of aspirin: Secondary | ICD-10-CM | POA: Diagnosis not present

## 2021-07-24 DIAGNOSIS — I272 Pulmonary hypertension, unspecified: Secondary | ICD-10-CM | POA: Diagnosis not present

## 2021-07-24 DIAGNOSIS — D696 Thrombocytopenia, unspecified: Secondary | ICD-10-CM | POA: Diagnosis not present

## 2021-07-24 DIAGNOSIS — N179 Acute kidney failure, unspecified: Secondary | ICD-10-CM | POA: Diagnosis not present

## 2021-07-24 DIAGNOSIS — Z7901 Long term (current) use of anticoagulants: Secondary | ICD-10-CM | POA: Diagnosis not present

## 2021-07-24 DIAGNOSIS — Z981 Arthrodesis status: Secondary | ICD-10-CM | POA: Diagnosis not present

## 2021-07-24 DIAGNOSIS — I4892 Unspecified atrial flutter: Secondary | ICD-10-CM | POA: Diagnosis not present

## 2021-07-24 DIAGNOSIS — N1831 Chronic kidney disease, stage 3a: Secondary | ICD-10-CM | POA: Diagnosis not present

## 2021-07-24 DIAGNOSIS — Z9181 History of falling: Secondary | ICD-10-CM | POA: Diagnosis not present

## 2021-07-24 DIAGNOSIS — I255 Ischemic cardiomyopathy: Secondary | ICD-10-CM | POA: Diagnosis not present

## 2021-07-24 DIAGNOSIS — E1122 Type 2 diabetes mellitus with diabetic chronic kidney disease: Secondary | ICD-10-CM | POA: Diagnosis not present

## 2021-07-24 DIAGNOSIS — E872 Acidosis, unspecified: Secondary | ICD-10-CM | POA: Diagnosis not present

## 2021-07-24 DIAGNOSIS — Z955 Presence of coronary angioplasty implant and graft: Secondary | ICD-10-CM | POA: Diagnosis not present

## 2021-07-24 DIAGNOSIS — I130 Hypertensive heart and chronic kidney disease with heart failure and stage 1 through stage 4 chronic kidney disease, or unspecified chronic kidney disease: Secondary | ICD-10-CM | POA: Diagnosis not present

## 2021-07-24 DIAGNOSIS — I2510 Atherosclerotic heart disease of native coronary artery without angina pectoris: Secondary | ICD-10-CM | POA: Diagnosis not present

## 2021-07-24 DIAGNOSIS — M1030 Gout due to renal impairment, unspecified site: Secondary | ICD-10-CM | POA: Diagnosis not present

## 2021-07-24 DIAGNOSIS — I5022 Chronic systolic (congestive) heart failure: Secondary | ICD-10-CM | POA: Diagnosis not present

## 2021-07-24 DIAGNOSIS — Z87891 Personal history of nicotine dependence: Secondary | ICD-10-CM | POA: Diagnosis not present

## 2021-07-24 DIAGNOSIS — Z96651 Presence of right artificial knee joint: Secondary | ICD-10-CM | POA: Diagnosis not present

## 2021-07-27 ENCOUNTER — Other Ambulatory Visit
Admission: RE | Admit: 2021-07-27 | Discharge: 2021-07-27 | Disposition: A | Discharge status: Home / Self Care | Payer: Medicare Other | Attending: Family Medicine | Admitting: Family Medicine

## 2021-07-27 ENCOUNTER — Encounter: Payer: Self-pay | Admitting: Family Medicine

## 2021-07-27 ENCOUNTER — Other Ambulatory Visit: Payer: Self-pay | Admitting: Family Medicine

## 2021-07-27 ENCOUNTER — Ambulatory Visit (INDEPENDENT_AMBULATORY_CARE_PROVIDER_SITE_OTHER): Payer: Medicare Other | Admitting: Family Medicine

## 2021-07-27 VITALS — BP 108/80 | HR 72 | Ht 75.0 in | Wt 239.0 lb

## 2021-07-27 DIAGNOSIS — I5022 Chronic systolic (congestive) heart failure: Secondary | ICD-10-CM

## 2021-07-27 DIAGNOSIS — E860 Dehydration: Secondary | ICD-10-CM | POA: Diagnosis not present

## 2021-07-27 DIAGNOSIS — N179 Acute kidney failure, unspecified: Secondary | ICD-10-CM | POA: Diagnosis not present

## 2021-07-27 DIAGNOSIS — E861 Hypovolemia: Secondary | ICD-10-CM | POA: Diagnosis not present

## 2021-07-27 DIAGNOSIS — I5020 Unspecified systolic (congestive) heart failure: Secondary | ICD-10-CM | POA: Insufficient documentation

## 2021-07-27 DIAGNOSIS — D696 Thrombocytopenia, unspecified: Secondary | ICD-10-CM | POA: Diagnosis not present

## 2021-07-27 DIAGNOSIS — I9589 Other hypotension: Secondary | ICD-10-CM | POA: Diagnosis not present

## 2021-07-27 LAB — RENAL FUNCTION PANEL
Albumin: 3.1 g/dL — ABNORMAL LOW (ref 3.5–5.0)
Anion gap: 8 (ref 5–15)
BUN: 26 mg/dL — ABNORMAL HIGH (ref 8–23)
CO2: 19 mmol/L — ABNORMAL LOW (ref 22–32)
Calcium: 8.8 mg/dL — ABNORMAL LOW (ref 8.9–10.3)
Chloride: 111 mmol/L (ref 98–111)
Creatinine, Ser: 1.51 mg/dL — ABNORMAL HIGH (ref 0.61–1.24)
GFR, Estimated: 48 mL/min — ABNORMAL LOW (ref >60)
Glucose, Bld: 87 mg/dL (ref 70–99)
Phosphorus: 2.8 mg/dL (ref 2.5–4.6)
Potassium: 4.5 mmol/L (ref 3.5–5.1)
Sodium: 138 mmol/L (ref 135–145)

## 2021-07-27 NOTE — Progress Notes (Signed)
? ? ?Date:  07/27/2021  ? ?Name:  Christopher [name]   DOB:  Aug 14, 1945   MRN:  [id number] ? ? ?Chief Complaint: Follow-up ? ?Patient is a 76 year old male who presents for a followup hypotension and aki exam. The patient reports the following problems: dehydration. Health maintenance has been reviewed up to date. ?  ? ?Hypertension ?This is a chronic problem. The current episode started 1 to 4 weeks ago. The problem has been gradually improving since onset. The problem is controlled. Pertinent negatives include no chest pain, headaches, neck pain, orthopnea, palpitations, peripheral edema, PND or shortness of breath. There are no associated agents to hypertension. Treatments tried: entresto/metoprolol. The current treatment provides mild improvement. There are no compliance problems.   ? ?Lab Results  ?Component Value Date  ? NA 146 03/31/2021  ? K 4.3 03/31/2021  ? CO2 26 (A) 03/31/2021  ? GLUCOSE 84 12/25/2019  ? BUN 22 (A) 03/31/2021  ? CREATININE 1.6 (A) 03/31/2021  ? CALCIUM 9.3 03/31/2021  ? GFRNONAA 42 (L) 12/25/2019  ? ?Lab Results  ?Component Value Date  ? CHOL 94 (L) 05/18/2021  ? HDL 27 (L) 05/18/2021  ? LDLCALC 47 05/18/2021  ? TRIG 108 05/18/2021  ? CHOLHDL 5.0 01/24/2018  ? ?Lab Results  ?Component Value Date  ? TSH 3.423 06/12/2014  ? ?Lab Results  ?Component Value Date  ? HGBA1C 4.8 07/08/2020  ? ?Lab Results  ?Component Value Date  ? WBC 10.5 12/25/2019  ? HGB 12.8 (L) 12/25/2019  ? HCT 38.4 12/25/2019  ? MCV 90 12/25/2019  ? PLT 244 12/25/2019  ? ?Lab Results  ?Component Value Date  ? ALT 37 05/18/2021  ? AST 31 05/18/2021  ? ALKPHOS 176 (H) 05/18/2021  ? BILITOT 0.3 05/18/2021  ? ?No results found for: 25OHVITD2, [place], VD25OH  ? ?Review of Systems  ?Constitutional:  Negative for chills and fever.  ?HENT:  Negative for drooling, ear discharge, ear pain and sore throat.   ?Respiratory:  Negative for cough, shortness of breath and wheezing.   ?Cardiovascular:  Negative for chest pain,  palpitations, orthopnea, leg swelling and PND.  ?Gastrointestinal:  Negative for abdominal pain, blood in stool, constipation, diarrhea and nausea.  ?Endocrine: Negative for polydipsia.  ?Genitourinary:  Negative for dysuria, frequency, hematuria and urgency.  ?Musculoskeletal:  Negative for back pain, myalgias and neck pain.  ?Skin:  Negative for rash.  ?Allergic/Immunologic: Negative for environmental allergies.  ?Neurological:  Negative for dizziness and headaches.  ?Hematological:  Does not bruise/bleed easily.  ?Psychiatric/Behavioral:  Negative for suicidal ideas. The patient is not nervous/anxious.   ? ?Patient Active Problem List  ? Diagnosis Date Noted  ? Severe pulmonary hypertension ([place]) 07/07/2020  ? Metabolic acidosis 01/06/8526  ? Hyponatremia 07/07/2020  ? Hyperkalemia 11/14/2019  ? Family history of colon cancer   ? Polyp of colon   ? Ischemic cardiomyopathy 12/19/2018  ? Acute renal failure superimposed on stage 3 chronic kidney disease ([place]) 10/14/2018  ? Atrial flutter ([place]) 10/14/2018  ? Chronic systolic heart failure ([place]) 10/14/2018  ? History of TIA (transient ischemic attack) 10/14/2018  ? Volume overload 10/14/2018  ? Cerebral vascular disease 09/08/2018  ? TIA (transient ischemic attack) 12/12/2017  ? 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 Encounter for general adult medical examination without abnormal findings 10/22/2014  ? Disorder of kidney and ureter 10/22/2014  ? Renal colic 78/24/2353  ? Coronary artery abnormality 10/22/2014  ? Creatinine elevation 10/22/2014  ? Essential (primary)  hypertension 10/22/2014  ? H/O hypercholesterolemia 10/22/2014  ? Dysmetabolic syndrome 02/77/4128  ? H/O acute myocardial infarction 10/22/2014  ? Adiposity 10/22/2014  ? Impaired renal function 10/22/2014  ? Need for vaccination 10/22/2014  ? Status post total right knee replacement 06/20/2014  ? S/P coronary artery stent placement 04/09/2014  ? Primary osteoarthritis of right  knee 03/26/2014  ? Chronic kidney disease, stage [name] (moderate) ([place]) 01/25/2011  ? ? ?No Known Allergies ? ?Past Surgical History:  ?Procedure Laterality Date  ? CARDIAC CATHETERIZATION  2000  ? stent   ? COLONOSCOPY  2011  ? COLONOSCOPY WITH PROPOFOL N/A 09/25/2019  ? Procedure: COLONOSCOPY WITH PROPOFOL;  Surgeon: Virgel Manifold, MD;  Location: [place];  Service: Endoscopy;  Laterality: N/A;  priority 4  ? JOINT REPLACEMENT Right   ? knee  ? KNEE ARTHROSCOPY Right 2016  ? POLYPECTOMY N/A 09/25/2019  ? Procedure: POLYPECTOMY;  Surgeon: Virgel Manifold, MD;  Location: [place];  Service: Endoscopy;  Laterality: N/A;  ? ? ?Social History  ? ?Tobacco Use  ? Smoking status: Former  ?  Types: Cigarettes  ?  Quit date: 3  ?  Years since quitting: 41.3  ? Smokeless tobacco: Never  ?Vaping Use  ? Vaping Use: Never used  ?Substance Use Topics  ? Alcohol use: No  ?  Alcohol/week: 0.0 standard drinks  ? Drug use: No  ? ? ? ?Medication list has been reviewed and updated. ? ?Current Meds  ?Medication Sig  ? allopurinol (ZYLOPRIM) 100 MG tablet Take 1 tablet by mouth daily  ? atorvastatin (LIPITOR) 80 MG tablet Take 1 tablet (80 mg total) by mouth daily.  ? dapagliflozin propanediol (FARXIGA) 10 MG TABS tablet Take 1 tablet by mouth daily. [facility]  ? diclofenac sodium (VOLTAREN) 1 % GEL Apply 4 g topically 4 (four) times daily.  ? ENTRESTO 97-103 MG Take 0.5 tablets by mouth 2 (two) times daily.  ? metoprolol succinate (TOPROL-XL) 100 MG 24 hr tablet Take 100 mg by mouth daily.  ? XARELTO 20 MG TABS tablet Take 20 mg by mouth daily.  ? ? ? ?  07/27/2021  ?  1:25 PM 05/18/2021  ?  2:26 PM 07/18/2020  ?  1:32 PM 02/22/2020  ?  9:40 AM  ?GAD 7 : Generalized Anxiety Score  ?Nervous, Anxious, on Edge 0 0 0 0  ?Control/stop worrying 0 0 0 0  ?Worry too much - different things 0 0 0 0  ?Trouble relaxing 0 0 0 0  ?Restless 0 0 0 0  ?Easily annoyed or irritable 0 0 0 0  ?Afraid - awful might happen 0 0 0 0   ?Total GAD 7 Score 0 0 0 0  ?Anxiety Difficulty Not difficult at all Not difficult at all    ? ? ? ?  07/27/2021  ?  1:25 PM  ?Depression screen PHQ 2/9  ?Decreased Interest 0  ?Down, Depressed, Hopeless 0  ?PHQ - 2 Score 0  ?Altered sleeping 0  ?Tired, decreased energy 0  ?Change in appetite 0  ?Feeling bad or failure about yourself  0  ?Trouble concentrating 0  ?Moving slowly or fidgety/restless 0  ?Suicidal thoughts 0  ?PHQ-9 Score 0  ?Difficult doing work/chores Not difficult at all  ? ? ?BP Readings from Last 3 Encounters:  ?07/27/21 108/80  ?05/18/21 110/74  ?07/18/20 112/70  ? ? ?Physical Exam ?Vitals and nursing note reviewed.  ?HENT:  ?   Head: Normocephalic.  ?  Right Ear: Tympanic membrane, ear canal and external ear normal. There is no impacted cerumen.  ?   Left Ear: Tympanic membrane, ear canal and external ear normal. There is no impacted cerumen.  ?   Nose: Nose normal. No congestion or rhinorrhea.  ?   Mouth/Throat:  ?   Mouth: Mucous membranes are moist.  ?Eyes:  ?   General: No scleral icterus.    ?   Right eye: No discharge.     ?   Left eye: No discharge.  ?   Conjunctiva/sclera: Conjunctivae normal.  ?   Pupils: Pupils are equal, round, and reactive to light.  ?Neck:  ?   Thyroid: No thyromegaly.  ?   Vascular: No JVD.  ?   Trachea: No tracheal deviation.  ?Cardiovascular:  ?   Rate and Rhythm: Normal rate and regular rhythm.  ?   Heart sounds: Normal heart sounds. No murmur heard. ?  No friction rub. No gallop.  ?Pulmonary:  ?   Effort: No respiratory distress.  ?   Breath sounds: Normal breath sounds. No decreased breath sounds, wheezing, rhonchi or rales.  ?Abdominal:  ?   General: Bowel sounds are normal.  ?   Palpations: Abdomen is soft. There is no mass.  ?   Tenderness: There is no abdominal tenderness. There is no guarding or rebound.  ?Musculoskeletal:     ?   General: No tenderness. Normal range of motion.  ?   Cervical back: Normal range of motion and neck supple.  ?Lymphadenopathy:   ?   Cervical: No cervical adenopathy.  ?Skin: ?   General: Skin is warm.  ?   Findings: No rash.  ?Neurological:  ?   Mental Status: He is alert and oriented to person, place, and time.  ?   Cranial Nerves: No cr

## 2021-07-28 ENCOUNTER — Other Ambulatory Visit: Payer: Self-pay | Admitting: Family Medicine

## 2021-07-29 DIAGNOSIS — D696 Thrombocytopenia, unspecified: Secondary | ICD-10-CM | POA: Diagnosis not present

## 2021-07-29 DIAGNOSIS — E1122 Type 2 diabetes mellitus with diabetic chronic kidney disease: Secondary | ICD-10-CM | POA: Diagnosis not present

## 2021-07-29 DIAGNOSIS — M1030 Gout due to renal impairment, unspecified site: Secondary | ICD-10-CM | POA: Diagnosis not present

## 2021-07-29 DIAGNOSIS — I272 Pulmonary hypertension, unspecified: Secondary | ICD-10-CM | POA: Diagnosis not present

## 2021-07-29 DIAGNOSIS — Z7982 Long term (current) use of aspirin: Secondary | ICD-10-CM | POA: Diagnosis not present

## 2021-07-29 DIAGNOSIS — I4892 Unspecified atrial flutter: Secondary | ICD-10-CM | POA: Diagnosis not present

## 2021-07-29 DIAGNOSIS — Z96651 Presence of right artificial knee joint: Secondary | ICD-10-CM | POA: Diagnosis not present

## 2021-07-29 DIAGNOSIS — E872 Acidosis, unspecified: Secondary | ICD-10-CM | POA: Diagnosis not present

## 2021-07-29 DIAGNOSIS — N179 Acute kidney failure, unspecified: Secondary | ICD-10-CM | POA: Diagnosis not present

## 2021-07-29 DIAGNOSIS — I5022 Chronic systolic (congestive) heart failure: Secondary | ICD-10-CM | POA: Diagnosis not present

## 2021-07-29 DIAGNOSIS — N1831 Chronic kidney disease, stage 3a: Secondary | ICD-10-CM | POA: Diagnosis not present

## 2021-07-29 DIAGNOSIS — Z9181 History of falling: Secondary | ICD-10-CM | POA: Diagnosis not present

## 2021-07-29 DIAGNOSIS — Z981 Arthrodesis status: Secondary | ICD-10-CM | POA: Diagnosis not present

## 2021-07-29 DIAGNOSIS — Z955 Presence of coronary angioplasty implant and graft: Secondary | ICD-10-CM | POA: Diagnosis not present

## 2021-07-29 DIAGNOSIS — I2510 Atherosclerotic heart disease of native coronary artery without angina pectoris: Secondary | ICD-10-CM | POA: Diagnosis not present

## 2021-07-29 DIAGNOSIS — Z8673 Personal history of transient ischemic attack (TIA), and cerebral infarction without residual deficits: Secondary | ICD-10-CM | POA: Diagnosis not present

## 2021-07-29 DIAGNOSIS — E782 Mixed hyperlipidemia: Secondary | ICD-10-CM | POA: Diagnosis not present

## 2021-07-29 DIAGNOSIS — Z7901 Long term (current) use of anticoagulants: Secondary | ICD-10-CM | POA: Diagnosis not present

## 2021-07-29 DIAGNOSIS — I255 Ischemic cardiomyopathy: Secondary | ICD-10-CM | POA: Diagnosis not present

## 2021-07-29 DIAGNOSIS — Z87891 Personal history of nicotine dependence: Secondary | ICD-10-CM | POA: Diagnosis not present

## 2021-07-29 DIAGNOSIS — I130 Hypertensive heart and chronic kidney disease with heart failure and stage 1 through stage 4 chronic kidney disease, or unspecified chronic kidney disease: Secondary | ICD-10-CM | POA: Diagnosis not present

## 2021-07-31 ENCOUNTER — Ambulatory Visit: Payer: Medicare Other | Admitting: Family Medicine

## 2021-08-03 DIAGNOSIS — M1030 Gout due to renal impairment, unspecified site: Secondary | ICD-10-CM | POA: Diagnosis not present

## 2021-08-03 DIAGNOSIS — N179 Acute kidney failure, unspecified: Secondary | ICD-10-CM | POA: Diagnosis not present

## 2021-08-03 DIAGNOSIS — I272 Pulmonary hypertension, unspecified: Secondary | ICD-10-CM | POA: Diagnosis not present

## 2021-08-03 DIAGNOSIS — E1122 Type 2 diabetes mellitus with diabetic chronic kidney disease: Secondary | ICD-10-CM | POA: Diagnosis not present

## 2021-08-03 DIAGNOSIS — I255 Ischemic cardiomyopathy: Secondary | ICD-10-CM | POA: Diagnosis not present

## 2021-08-03 DIAGNOSIS — I5022 Chronic systolic (congestive) heart failure: Secondary | ICD-10-CM | POA: Diagnosis not present

## 2021-08-03 DIAGNOSIS — N1831 Chronic kidney disease, stage 3a: Secondary | ICD-10-CM | POA: Diagnosis not present

## 2021-08-03 DIAGNOSIS — E872 Acidosis, unspecified: Secondary | ICD-10-CM | POA: Diagnosis not present

## 2021-08-03 DIAGNOSIS — I130 Hypertensive heart and chronic kidney disease with heart failure and stage 1 through stage 4 chronic kidney disease, or unspecified chronic kidney disease: Secondary | ICD-10-CM | POA: Diagnosis not present

## 2021-08-03 DIAGNOSIS — I4892 Unspecified atrial flutter: Secondary | ICD-10-CM | POA: Diagnosis not present

## 2021-08-03 DIAGNOSIS — I2510 Atherosclerotic heart disease of native coronary artery without angina pectoris: Secondary | ICD-10-CM | POA: Diagnosis not present

## 2021-08-03 DIAGNOSIS — D696 Thrombocytopenia, unspecified: Secondary | ICD-10-CM | POA: Diagnosis not present

## 2021-08-04 DIAGNOSIS — Z7901 Long term (current) use of anticoagulants: Secondary | ICD-10-CM | POA: Diagnosis not present

## 2021-08-04 DIAGNOSIS — Z87891 Personal history of nicotine dependence: Secondary | ICD-10-CM | POA: Diagnosis not present

## 2021-08-04 DIAGNOSIS — Z981 Arthrodesis status: Secondary | ICD-10-CM | POA: Diagnosis not present

## 2021-08-04 DIAGNOSIS — Z9181 History of falling: Secondary | ICD-10-CM | POA: Diagnosis not present

## 2021-08-04 DIAGNOSIS — M1030 Gout due to renal impairment, unspecified site: Secondary | ICD-10-CM | POA: Diagnosis not present

## 2021-08-04 DIAGNOSIS — E1122 Type 2 diabetes mellitus with diabetic chronic kidney disease: Secondary | ICD-10-CM | POA: Diagnosis not present

## 2021-08-04 DIAGNOSIS — I4892 Unspecified atrial flutter: Secondary | ICD-10-CM | POA: Diagnosis not present

## 2021-08-04 DIAGNOSIS — I2510 Atherosclerotic heart disease of native coronary artery without angina pectoris: Secondary | ICD-10-CM | POA: Diagnosis not present

## 2021-08-04 DIAGNOSIS — I272 Pulmonary hypertension, unspecified: Secondary | ICD-10-CM | POA: Diagnosis not present

## 2021-08-04 DIAGNOSIS — I255 Ischemic cardiomyopathy: Secondary | ICD-10-CM | POA: Diagnosis not present

## 2021-08-04 DIAGNOSIS — Z8673 Personal history of transient ischemic attack (TIA), and cerebral infarction without residual deficits: Secondary | ICD-10-CM | POA: Diagnosis not present

## 2021-08-04 DIAGNOSIS — N179 Acute kidney failure, unspecified: Secondary | ICD-10-CM | POA: Diagnosis not present

## 2021-08-04 DIAGNOSIS — N1831 Chronic kidney disease, stage 3a: Secondary | ICD-10-CM | POA: Diagnosis not present

## 2021-08-04 DIAGNOSIS — I130 Hypertensive heart and chronic kidney disease with heart failure and stage 1 through stage 4 chronic kidney disease, or unspecified chronic kidney disease: Secondary | ICD-10-CM | POA: Diagnosis not present

## 2021-08-04 DIAGNOSIS — I5022 Chronic systolic (congestive) heart failure: Secondary | ICD-10-CM | POA: Diagnosis not present

## 2021-08-04 DIAGNOSIS — E872 Acidosis, unspecified: Secondary | ICD-10-CM | POA: Diagnosis not present

## 2021-08-04 DIAGNOSIS — Z96651 Presence of right artificial knee joint: Secondary | ICD-10-CM | POA: Diagnosis not present

## 2021-08-04 DIAGNOSIS — Z7982 Long term (current) use of aspirin: Secondary | ICD-10-CM | POA: Diagnosis not present

## 2021-08-04 DIAGNOSIS — E782 Mixed hyperlipidemia: Secondary | ICD-10-CM | POA: Diagnosis not present

## 2021-08-04 DIAGNOSIS — Z955 Presence of coronary angioplasty implant and graft: Secondary | ICD-10-CM | POA: Diagnosis not present

## 2021-08-04 DIAGNOSIS — D696 Thrombocytopenia, unspecified: Secondary | ICD-10-CM | POA: Diagnosis not present

## 2021-08-06 DIAGNOSIS — N183 Chronic kidney disease, stage 3 unspecified: Secondary | ICD-10-CM | POA: Diagnosis not present

## 2021-08-06 DIAGNOSIS — I10 Essential (primary) hypertension: Secondary | ICD-10-CM | POA: Diagnosis not present

## 2021-08-06 DIAGNOSIS — D631 Anemia in chronic kidney disease: Secondary | ICD-10-CM | POA: Diagnosis not present

## 2021-08-06 DIAGNOSIS — N1831 Chronic kidney disease, stage 3a: Secondary | ICD-10-CM | POA: Diagnosis not present

## 2021-08-11 DIAGNOSIS — I2510 Atherosclerotic heart disease of native coronary artery without angina pectoris: Secondary | ICD-10-CM | POA: Diagnosis not present

## 2021-08-11 DIAGNOSIS — Z7901 Long term (current) use of anticoagulants: Secondary | ICD-10-CM | POA: Diagnosis not present

## 2021-08-11 DIAGNOSIS — I5022 Chronic systolic (congestive) heart failure: Secondary | ICD-10-CM | POA: Diagnosis not present

## 2021-08-11 DIAGNOSIS — Z955 Presence of coronary angioplasty implant and graft: Secondary | ICD-10-CM | POA: Diagnosis not present

## 2021-08-11 DIAGNOSIS — Z9181 History of falling: Secondary | ICD-10-CM | POA: Diagnosis not present

## 2021-08-11 DIAGNOSIS — I130 Hypertensive heart and chronic kidney disease with heart failure and stage 1 through stage 4 chronic kidney disease, or unspecified chronic kidney disease: Secondary | ICD-10-CM | POA: Diagnosis not present

## 2021-08-11 DIAGNOSIS — E1122 Type 2 diabetes mellitus with diabetic chronic kidney disease: Secondary | ICD-10-CM | POA: Diagnosis not present

## 2021-08-11 DIAGNOSIS — Z87891 Personal history of nicotine dependence: Secondary | ICD-10-CM | POA: Diagnosis not present

## 2021-08-11 DIAGNOSIS — I255 Ischemic cardiomyopathy: Secondary | ICD-10-CM | POA: Diagnosis not present

## 2021-08-11 DIAGNOSIS — I272 Pulmonary hypertension, unspecified: Secondary | ICD-10-CM | POA: Diagnosis not present

## 2021-08-11 DIAGNOSIS — E782 Mixed hyperlipidemia: Secondary | ICD-10-CM | POA: Diagnosis not present

## 2021-08-11 DIAGNOSIS — D696 Thrombocytopenia, unspecified: Secondary | ICD-10-CM | POA: Diagnosis not present

## 2021-08-11 DIAGNOSIS — N179 Acute kidney failure, unspecified: Secondary | ICD-10-CM | POA: Diagnosis not present

## 2021-08-11 DIAGNOSIS — N1831 Chronic kidney disease, stage 3a: Secondary | ICD-10-CM | POA: Diagnosis not present

## 2021-08-11 DIAGNOSIS — E872 Acidosis, unspecified: Secondary | ICD-10-CM | POA: Diagnosis not present

## 2021-08-11 DIAGNOSIS — Z96651 Presence of right artificial knee joint: Secondary | ICD-10-CM | POA: Diagnosis not present

## 2021-08-11 DIAGNOSIS — Z981 Arthrodesis status: Secondary | ICD-10-CM | POA: Diagnosis not present

## 2021-08-11 DIAGNOSIS — I4892 Unspecified atrial flutter: Secondary | ICD-10-CM | POA: Diagnosis not present

## 2021-08-11 DIAGNOSIS — Z8673 Personal history of transient ischemic attack (TIA), and cerebral infarction without residual deficits: Secondary | ICD-10-CM | POA: Diagnosis not present

## 2021-08-11 DIAGNOSIS — M1030 Gout due to renal impairment, unspecified site: Secondary | ICD-10-CM | POA: Diagnosis not present

## 2021-08-11 DIAGNOSIS — Z7982 Long term (current) use of aspirin: Secondary | ICD-10-CM | POA: Diagnosis not present

## 2021-08-12 DIAGNOSIS — N1831 Chronic kidney disease, stage 3a: Secondary | ICD-10-CM | POA: Diagnosis not present

## 2021-08-12 DIAGNOSIS — I10 Essential (primary) hypertension: Secondary | ICD-10-CM | POA: Diagnosis not present

## 2021-08-12 DIAGNOSIS — I483 Typical atrial flutter: Secondary | ICD-10-CM | POA: Diagnosis not present

## 2021-08-12 DIAGNOSIS — E782 Mixed hyperlipidemia: Secondary | ICD-10-CM | POA: Diagnosis not present

## 2021-08-12 DIAGNOSIS — I2510 Atherosclerotic heart disease of native coronary artery without angina pectoris: Secondary | ICD-10-CM | POA: Diagnosis not present

## 2021-08-12 DIAGNOSIS — I5022 Chronic systolic (congestive) heart failure: Secondary | ICD-10-CM | POA: Diagnosis not present

## 2021-10-12 DIAGNOSIS — I2510 Atherosclerotic heart disease of native coronary artery without angina pectoris: Secondary | ICD-10-CM | POA: Diagnosis not present

## 2021-10-12 DIAGNOSIS — E782 Mixed hyperlipidemia: Secondary | ICD-10-CM | POA: Diagnosis not present

## 2021-10-12 DIAGNOSIS — I5022 Chronic systolic (congestive) heart failure: Secondary | ICD-10-CM | POA: Diagnosis not present

## 2021-10-12 DIAGNOSIS — I10 Essential (primary) hypertension: Secondary | ICD-10-CM | POA: Diagnosis not present

## 2021-10-12 DIAGNOSIS — I483 Typical atrial flutter: Secondary | ICD-10-CM | POA: Diagnosis not present

## 2021-10-12 DIAGNOSIS — N1831 Chronic kidney disease, stage 3a: Secondary | ICD-10-CM | POA: Diagnosis not present

## 2021-11-05 DIAGNOSIS — I10 Essential (primary) hypertension: Secondary | ICD-10-CM | POA: Diagnosis not present

## 2021-11-05 DIAGNOSIS — N183 Chronic kidney disease, stage 3 unspecified: Secondary | ICD-10-CM | POA: Diagnosis not present

## 2021-11-13 ENCOUNTER — Other Ambulatory Visit: Payer: Self-pay | Admitting: Family Medicine

## 2021-11-13 DIAGNOSIS — E7849 Other hyperlipidemia: Secondary | ICD-10-CM

## 2021-11-16 ENCOUNTER — Other Ambulatory Visit: Payer: Self-pay | Admitting: Family Medicine

## 2021-11-16 DIAGNOSIS — E7849 Other hyperlipidemia: Secondary | ICD-10-CM

## 2021-11-17 ENCOUNTER — Other Ambulatory Visit (HOSPITAL_COMMUNITY): Payer: Self-pay

## 2021-11-17 MED ORDER — ATORVASTATIN CALCIUM 80 MG PO TABS
80.0000 mg | ORAL_TABLET | Freq: Every day | ORAL | 0 refills | Status: DC
  Filled 2021-11-17: qty 90, 90d supply, fill #0

## 2021-11-19 ENCOUNTER — Other Ambulatory Visit (HOSPITAL_COMMUNITY): Payer: Self-pay

## 2021-11-19 ENCOUNTER — Ambulatory Visit: Payer: Medicare Other | Admitting: Family Medicine

## 2021-11-20 ENCOUNTER — Other Ambulatory Visit (HOSPITAL_COMMUNITY): Payer: Self-pay

## 2022-02-16 DIAGNOSIS — L249 Irritant contact dermatitis, unspecified cause: Secondary | ICD-10-CM | POA: Diagnosis not present

## 2022-02-16 DIAGNOSIS — D485 Neoplasm of uncertain behavior of skin: Secondary | ICD-10-CM | POA: Diagnosis not present

## 2022-02-16 DIAGNOSIS — C4442 Squamous cell carcinoma of skin of scalp and neck: Secondary | ICD-10-CM | POA: Diagnosis not present

## 2022-03-29 ENCOUNTER — Telehealth: Payer: Self-pay | Admitting: Family Medicine

## 2022-03-29 NOTE — Telephone Encounter (Signed)
Copied from [place] [id number]. Topic: Medicare AWV >> Mar 29, 2022 11:23 AM Devoria Glassing wrote: Reason for CRM: Left message tor patient to schedule Medicare Annual Wellness Visit (AWV) with [facility] Health Advisor.  Appointment can be an offiice/telephone or virtual visit;  Please call [phone] ask for [facility].

## 2022-04-08 DIAGNOSIS — C44329 Squamous cell carcinoma of skin of other parts of face: Secondary | ICD-10-CM | POA: Diagnosis not present

## 2022-04-09 ENCOUNTER — Telehealth: Payer: Self-pay | Admitting: Family Medicine

## 2022-04-09 NOTE — Telephone Encounter (Signed)
Copied from [place] [id number]. Topic: Medicare AWV >> Apr 09, 2022 10:46 AM Devoria Glassing wrote: Reason for CRM: Left message tor patient to schedule Medicare Annual Wellness Visit (AWV) with [facility] Health Advisor.  Appointment can be an offiice/telephone or virtual visit;  Please call [phone] ask for [facility].

## 2022-04-20 DIAGNOSIS — C44320 Squamous cell carcinoma of skin of unspecified parts of face: Secondary | ICD-10-CM | POA: Diagnosis not present

## 2022-04-20 DIAGNOSIS — C44329 Squamous cell carcinoma of skin of other parts of face: Secondary | ICD-10-CM | POA: Diagnosis not present

## 2022-04-21 ENCOUNTER — Ambulatory Visit (INDEPENDENT_AMBULATORY_CARE_PROVIDER_SITE_OTHER): Payer: Medicare HMO

## 2022-04-21 VITALS — Ht 75.0 in | Wt 239.0 lb

## 2022-04-21 DIAGNOSIS — Z0000 Encounter for general adult medical examination without abnormal findings: Secondary | ICD-10-CM

## 2022-04-21 NOTE — Progress Notes (Signed)
Virtual Visit via Telephone Note  I connected with  Christopher Christopher [name] on 04/21/22 at  9:00 AM EST by telephone and verified that I am speaking with the correct person using two identifiers.  Location: Patient: home Provider: [facility] Persons participating in the virtual visit: [place]   I discussed the limitations, risks, security and privacy concerns of performing an evaluation and management service by telephone and the availability of in person appointments. The patient expressed understanding and agreed to proceed.  Interactive audio and video telecommunications were attempted between this nurse and patient, however failed, due to patient having technical difficulties OR patient did not have access to video capability.  We continued and completed visit with audio only.  Some vital signs may be absent or patient reported.   Christopher David, LPN  Subjective:   LUX MEADERS Christopher [name] is a 77 y.o. male who presents for Medicare Annual/Subsequent preventive examination.  Review of Systems     Cardiac Risk Factors include: advanced age (>25mn, >>49women);dyslipidemia;hypertension     Objective:    There were no vitals filed for this visit. There is no height or weight on file to calculate BMI.     04/21/2022    9:04 AM 04/06/2021    2:58 PM 04/02/2020    2:33 PM 09/25/2019   10:33 AM 04/02/2019    3:35 PM 12/12/2017   12:30 PM 06/19/2017   11:53 AM  Advanced Directives  Does Patient Have a Medical Advance Directive? No Yes Yes Yes Yes Yes Yes  Type of ASocial research officer, governmentLiving will HPat[place]Living will HCrooksLiving will HSeventh MountainLiving will [place] Does patient want to make changes to medical advance directive?  Yes (MAU/Ambulatory/Procedural Areas - Information given)  No - Patient declined  No - Patient declined    Copy of [place]in Chart?  No - copy requested No - copy requested No - copy requested No - copy requested No - copy requested   Would patient like information on creating a medical advance directive? No - Patient declined          Current Medications (verified) Outpatient Encounter Medications as of 04/21/2022  Medication Sig   allopurinol (ZYLOPRIM) 100 MG tablet Take 1 tablet by mouth daily   atorvastatin (LIPITOR) 80 MG tablet Take 1 tablet by mouth daily.   dapagliflozin propanediol (FARXIGA) 10 MG TABS tablet Take 1 tablet by mouth daily. [facility]   diclofenac sodium (VOLTAREN) 1 % GEL Apply 4 g topically 4 (four) times daily.   ENTRESTO 97-103 MG Take 0.5 tablets by mouth 2 (two) times daily.   metoprolol succinate (TOPROL-XL) 100 MG 24 hr tablet Take 100 mg by mouth daily.   sodium bicarbonate 650 MG tablet Take 650 mg by mouth 2 (two) times daily.   spironolactone (ALDACTONE) 25 MG tablet Take 1 tablet by mouth daily. cardio   XARELTO 20 MG TABS tablet Take 20 mg by mouth daily.   No facility-administered encounter medications on file as of 04/21/2022.    Allergies (verified) Patient has no known allergies.   History: Past Medical History:  Diagnosis Date   Arthritis    ankles, hands, left knee   CHF (congestive heart failure) (HCC)    Chronic kidney disease (CKD), stage Christopher [name] (moderate) (HCC)    Coronary artery disease    Gout  Hyperlipidemia    Hypertension    Myocardial infarction [facility]) 2000   TIA (transient ischemic attack) 2003   Wears dentures    full upper   Past Surgical History:  Procedure Laterality Date   CARDIAC CATHETERIZATION  2000   stent    COLONOSCOPY  2011   COLONOSCOPY WITH PROPOFOL N/A 09/25/2019   Procedure: COLONOSCOPY WITH PROPOFOL;  Surgeon: Virgel Manifold, MD;  Location: [employer];  Service: Endoscopy;  Laterality: N/A;  priority 4   JOINT REPLACEMENT Right    knee   KNEE ARTHROSCOPY Right 2016    POLYPECTOMY N/A 09/25/2019   Procedure: POLYPECTOMY;  Surgeon: Virgel Manifold, MD;  Location: [place];  Service: Endoscopy;  Laterality: N/A;   Family History  Problem Relation Age of Onset   Cancer Father    Heart disease Father    Diabetes Paternal Grandmother    Social History   Socioeconomic History   Marital status: Widowed    Spouse name: Not on file   Number of children: 2   Years of education: Not on file   Highest education level: Not on file  Occupational History   Not on file  Tobacco Use   Smoking status: Former    Types: Cigarettes    Quit date: 38    Years since quitting: 42.1   Smokeless tobacco: Never  Vaping Use   Vaping Use: Never used  Substance and Sexual Activity   Alcohol use: No    Alcohol/week: 0.0 standard drinks of alcohol   Drug use: No   Sexual activity: Never  Other Topics Concern   Not on file  Social History Narrative   Pt lives with his son, daughter in law and 2 grandchildren   Social Determinants of Health   Financial Resource Strain: Low Risk  (04/21/2022)   Overall Financial Resource Strain (CARDIA)    Difficulty of Paying Living Expenses: Not hard at all  Food Insecurity: No [place] (04/21/2022)   Hunger Vital Sign    Worried About Running Out of Food in the Last Year: Never true    [place] in the Last Year: Never true  Transportation Needs: No Transportation Needs (04/21/2022)   PRAPARE - Hydrologist (Medical): No    Lack of Transportation (Non-Medical): No  Physical Activity: Insufficiently Active (04/21/2022)   Exercise Vital Sign    Days of Exercise per Week: 2 days    Minutes of Exercise per Session: 20 min  Stress: No Stress Concern Present (04/21/2022)   [place]    Feeling of Stress : Only a little  Social Connections: Socially Isolated (04/21/2022)   Social Connection and Isolation Panel [NHANES]     Frequency of Communication with Friends and Family: More than three times a week    Frequency of Social Gatherings with Friends and Family: More than three times a week    Attends Religious Services: Never    Marine scientist or Organizations: No    Attends Archivist Meetings: Never    Marital Status: Widowed    Tobacco Counseling Counseling given: Not Answered   Clinical Intake:  Pre-visit preparation completed: Yes  Pain : No/denies pain     Nutritional Risks: None Diabetes: No  How often do you need to have someone help you when you read instructions, pamphlets, or other written materials from your doctor or pharmacy?: 1 -  Never  Diabetic?no  Interpreter Needed?: No  Information entered by :: Kirke Shaggy, LPN   Activities of Daily Living    04/21/2022    9:05 AM  In your present state of health, do you have any difficulty performing the following activities:  Hearing? 0  Vision? 0  Difficulty concentrating or making decisions? 0  Walking or climbing stairs? 0  Dressing or bathing? 0  Doing errands, shopping? 0  Preparing Food and eating ? N  Using the Toilet? N  In the past six months, have you accidently leaked urine? N  Do you have problems with loss of bowel control? N  Managing your Medications? N  Managing your Finances? N  Housekeeping or managing your Housekeeping? N    Patient Care Team: Juline Patch, MD as PCP - General (Family Medicine) Rolene Course, MD as Referring Physician (Cardiology) Clarice Pole, MD as Referring Physician (Nephrology)  Indicate any recent Medical Services you may have received from other than [facility] providers in the past year (date may be approximate).     Assessment:   This is a routine wellness examination for [place].  Hearing/Vision screen Hearing Screening - Comments:: No aids Vision Screening - Comments:: Wears glasses- [facility]  Dietary issues and exercise activities  discussed: Current Exercise Habits: Home exercise routine, Type of exercise: walking, Time (Minutes): 20, Frequency (Times/Week): 2, Weekly Exercise (Minutes/Week): 40, Intensity: Mild   Goals Addressed             This Visit's Progress    DIET - EAT MORE FRUITS AND VEGETABLES         Depression Screen    04/21/2022    9:03 AM 07/27/2021    1:25 PM 05/18/2021    2:26 PM 04/06/2021    2:58 PM 07/18/2020    1:31 PM 04/02/2020    2:31 PM 02/22/2020    9:34 AM  PHQ 2/9 Scores  PHQ - 2 Score 0 0 0 0 0 0 0  PHQ- 9 Score 0 0 0  0  0    Fall Risk    04/21/2022    9:05 AM 07/27/2021    1:26 PM 04/06/2021    3:03 PM 04/02/2020    2:34 PM 02/22/2020    9:34 AM  Fall Risk   Falls in the past year? 0 0 0 1 1  Number falls in past yr: 0 0 0 0 0  Injury with Fall? 0 0 0 1 1  Risk for fall due to : No Fall Risks No Fall Risks No Fall Risks History of fall(s);Orthopedic patient   Follow up Falls prevention discussed;Falls evaluation completed Falls evaluation completed Falls prevention discussed  Falls evaluation completed    FALL RISK PREVENTION PERTAINING TO THE HOME:  Any stairs in or around the home? Yes  If so, are there any without handrails? No  Home free of loose throw rugs in walkways, pet beds, electrical cords, etc? Yes  Adequate lighting in your home to reduce risk of falls? Yes   ASSISTIVE DEVICES UTILIZED TO PREVENT FALLS:  Life alert? No  Use of a cane, walker or w/c? Yes  Grab bars in the bathroom? Yes  Shower chair or bench in shower? Yes  Elevated toilet seat or a handicapped toilet? Yes    Cognitive Function:        04/21/2022    9:06 AM  6CIT Screen  What Year? 0 points  What month? 0 points  What time? 0  points  Count back from 20 0 points  Months in reverse 0 points  Repeat phrase 2 points  Total Score 2 points    Immunizations Immunization History  Administered Date(s) Administered   Fluad Quad(high Dose 65+) 12/25/2019   Influenza, High Dose  Seasonal PF 11/18/2016, 01/01/2019   Influenza,inj,Quad PF,6+ Mos 11/04/2017, 11/14/2018   Influenza-Unspecified 11/13/2016   PFIZER(Purple Top)SARS-COV-2 Vaccination 04/26/2019, 05/17/2019, 11/18/2019   Pneumococcal Conjugate-13 02/18/2016   Pneumococcal Polysaccharide-23 12/01/2012, 12/16/2016   Tdap 12/01/2012, 02/18/2016   Zoster Recombinat (Shingrix) 09/30/2017, 12/16/2017    TDAP status: Up to date  Flu Vaccine status: Up to date  Pneumococcal vaccine status: Up to date  Covid-19 vaccine status: Completed vaccines  Qualifies for Shingles Vaccine? Yes   Zostavax completed No   Shingrix Completed?: Yes  Screening Tests Health Maintenance  Topic Date Due   COLON CANCER SCREENING ANNUAL FOBT  08/26/2020   INFLUENZA VACCINE  10/13/2021   COVID-19 Vaccine (4 - 2023-24 season) 11/13/2021   Medicare Annual Wellness (AWV)  04/22/2023   DTaP/Tdap/Td (3 - Td or Tdap) 02/17/2026   COLONOSCOPY (Pts 45-55yr Insurance coverage will need to be confirmed)  09/24/2029   Pneumonia Vaccine 75 Years old  Completed   Hepatitis C Screening  Completed   Zoster Vaccines- Shingrix  Completed   HPV VACCINES  Aged Out    Health Maintenance  Health Maintenance Due  Topic Date Due   COLON CANCER SCREENING ANNUAL FOBT  08/26/2020   INFLUENZA VACCINE  10/13/2021   COVID-19 Vaccine (4 - 2023-24 season) 11/13/2021    Colorectal cancer screening: Type of screening: Colonoscopy. Completed 09/25/19. Repeat every 10 years  Lung Cancer Screening: (Low Dose CT Chest recommended if Age 75-80years, 30 pack-year currently smoking OR have quit w/in 15years.) does not qualify.    Additional Screening:  Hepatitis C Screening: does qualify; Completed 02/18/16  Vision Screening: Recommended annual ophthalmology exams for early detection of glaucoma and other disorders of the eye. Is the patient up to date with their annual eye exam?  Yes  Who is the provider or what is the name of the office in which  the patient attends annual eye exams? [facility] If pt is not established with a provider, would they like to be referred to a provider to establish care? No .   Dental Screening: Recommended annual dental exams for proper oral hygiene  Community Resource Referral / Chronic Care Management: CRR required this visit?  No   CCM required this visit?  No      Plan:     I have personally reviewed and noted the following in the patient's chart:   Medical and social history Use of alcohol, tobacco or illicit drugs  Current medications and supplements including opioid prescriptions. Patient is not currently taking opioid prescriptions. Functional ability and status Nutritional status Physical activity Advanced directives List of other physicians Hospitalizations, surgeries, and ER visits in previous 12 months Vitals Screenings to include cognitive, depression, and falls Referrals and appointments  In addition, I have reviewed and discussed with patient certain preventive protocols, quality metrics, and best practice recommendations. A written personalized care plan for preventive services as well as general preventive health recommendations were provided to patient.     LDionisio David LPN   [birth year]  Nurse Notes: none

## 2022-04-21 NOTE — Patient Instructions (Signed)
Christopher [name] , Thank you for taking time to come for your Medicare Wellness Visit. I appreciate your ongoing commitment to your health goals. Please review the following plan we discussed and let me know if I can assist you in the future.   These are the goals we discussed:  Goals      DIET - EAT MORE FRUITS AND VEGETABLES     Increase physical activity     Pt would like to increase physical activity to 150 minutes per week        This is a list of the screening recommended for you and due dates:  Health Maintenance  Topic Date Due   Stool Blood Test  08/26/2020   Flu Shot  10/13/2021   COVID-19 Vaccine (4 - 2023-24 season) 11/13/2021   Medicare Annual Wellness Visit  04/22/2023   DTaP/Tdap/Td vaccine (3 - Td or Tdap) 02/17/2026   Colon Cancer Screening  09/24/2029   Pneumonia Vaccine  Completed   Hepatitis C Screening: USPSTF Recommendation to screen - Ages 51-79 yo.  Completed   Zoster (Shingles) Vaccine  Completed   HPV Vaccine  Aged Out    Advanced directives: no  Conditions/risks identified: none  Next appointment: Follow up in one year for your annual wellness visit 04/27/23 @ 9:00 am by phone   Preventive Care 65 Years and Older, Male Preventive care refers to lifestyle choices and visits with your health care provider that can promote health and wellness. What does preventive care include? A yearly physical exam. This is also called an annual well check. Dental exams once or twice a year. Routine eye exams. Ask your health care provider how often you should have your eyes checked. Personal lifestyle choices, including: Daily care of your teeth and gums. Regular physical activity. Eating a healthy diet. Avoiding tobacco and drug use. Limiting alcohol use. Practicing safe sex. Taking low-dose aspirin every day. Taking vitamin and mineral supplements as recommended by your health care provider. What happens during an annual well check? The services and  screenings done by your health care provider during your annual well check will depend on your age, overall health, lifestyle risk factors, and family history of disease. Counseling  Your health care provider may ask you questions about your: Alcohol use. Tobacco use. Drug use. Emotional well-being. Home and relationship well-being. Sexual activity. Eating habits. History of falls. Memory and ability to understand (cognition). Work and work Statistician. Reproductive health. Screening  You may have the following tests or measurements: Height, weight, and BMI. Blood pressure. Lipid and cholesterol levels. These may be checked every 5 years, or more frequently if you are over 50 years old. Skin check. Lung cancer screening. You may have this screening every year starting at age 97 if you have a 30-pack-year history of smoking and currently smoke or have quit within the past 15 years. Fecal occult blood test (FOBT) of the stool. You may have this test every year starting at age 40. Flexible sigmoidoscopy or colonoscopy. You may have a sigmoidoscopy every 5 years or a colonoscopy every 10 years starting at age 90. Hepatitis C blood test. Hepatitis B blood test. Sexually transmitted disease (STD) testing. Diabetes screening. This is done by checking your blood sugar (glucose) after you have not eaten for a while (fasting). You may have this done every 1-3 years. Bone density scan. This is done to screen for osteoporosis. You may have this done starting at age 47. Mammogram. This may be done every  1-2 years. Talk to your health care provider about how often you should have regular mammograms. Talk with your health care provider about your test results, treatment options, and if necessary, the need for more tests. Vaccines  Your health care provider may recommend certain vaccines, such as: Influenza vaccine. This is recommended every year. Tetanus, diphtheria, and acellular pertussis (Tdap,  Td) vaccine. You may need a Td booster every 10 years. Zoster vaccine. You may need this after age 90. Pneumococcal 13-valent conjugate (PCV13) vaccine. One dose is recommended after age 47. Pneumococcal polysaccharide (PPSV23) vaccine. One dose is recommended after age 23. Talk to your health care provider about which screenings and vaccines you need and how often you need them. This information is not intended to replace advice given to you by your health care provider. Make sure you discuss any questions you have with your health care provider. Document Released: 03/28/2015 Document Revised: 11/19/2015 Document Reviewed: 12/31/2014 Elsevier Interactive Patient Education  2017 [place] Prevention in the Home Falls can cause injuries. They can happen to people of all ages. There are many things you can do to make your home safe and to help prevent falls. What can I do on the outside of my home? Regularly fix the edges of walkways and driveways and fix any cracks. Remove anything that might make you trip as you walk through a door, such as a raised step or threshold. Trim any bushes or trees on the path to your home. Use bright outdoor lighting. Clear any walking paths of anything that might make someone trip, such as rocks or tools. Regularly check to see if handrails are loose or broken. Make sure that both sides of any steps have handrails. Any raised decks and porches should have guardrails on the edges. Have any leaves, snow, or ice cleared regularly. Use sand or salt on walking paths during winter. Clean up any spills in your garage right away. This includes oil or grease spills. What can I do in the bathroom? Use night lights. Install grab bars by the toilet and in the tub and shower. Do not use towel bars as grab bars. Use non-skid mats or decals in the tub or shower. If you need to sit down in the shower, use a plastic, non-slip stool. Keep the floor dry. Clean up any  water that spills on the floor as soon as it happens. Remove soap buildup in the tub or shower regularly. Attach bath mats securely with double-sided non-slip rug tape. Do not have throw rugs and other things on the floor that can make you trip. What can I do in the bedroom? Use night lights. Make sure that you have a light by your bed that is easy to reach. Do not use any sheets or blankets that are too big for your bed. They should not hang down onto the floor. Have a firm chair that has side arms. You can use this for support while you get dressed. Do not have throw rugs and other things on the floor that can make you trip. What can I do in the kitchen? Clean up any spills right away. Avoid walking on wet floors. Keep items that you use a lot in easy-to-reach places. If you need to reach something above you, use a strong step stool that has a grab bar. Keep electrical cords out of the way. Do not use floor polish or wax that makes floors slippery. If you must use wax, use non-skid  floor wax. Do not have throw rugs and other things on the floor that can make you trip. What can I do with my stairs? Do not leave any items on the stairs. Make sure that there are handrails on both sides of the stairs and use them. Fix handrails that are broken or loose. Make sure that handrails are as long as the stairways. Check any carpeting to make sure that it is firmly attached to the stairs. Fix any carpet that is loose or worn. Avoid having throw rugs at the top or bottom of the stairs. If you do have throw rugs, attach them to the floor with carpet tape. Make sure that you have a light switch at the top of the stairs and the bottom of the stairs. If you do not have them, ask someone to add them for you. What else can I do to help prevent falls? Wear shoes that: Do not have high heels. Have rubber bottoms. Are comfortable and fit you well. Are closed at the toe. Do not wear sandals. If you use a  stepladder: Make sure that it is fully opened. Do not climb a closed stepladder. Make sure that both sides of the stepladder are locked into place. Ask someone to hold it for you, if possible. Clearly mark and make sure that you can see: Any grab bars or handrails. First and last steps. Where the edge of each step is. Use tools that help you move around (mobility aids) if they are needed. These include: Canes. Walkers. Scooters. Crutches. Turn on the lights when you go into a dark area. Replace any light bulbs as soon as they burn out. Set up your furniture so you have a clear path. Avoid moving your furniture around. If any of your floors are uneven, fix them. If there are any pets around you, be aware of where they are. Review your medicines with your doctor. Some medicines can make you feel dizzy. This can increase your chance of falling. Ask your doctor what other things that you can do to help prevent falls. This information is not intended to replace advice given to you by your health care provider. Make sure you discuss any questions you have with your health care provider. Document Released: 12/26/2008 Document Revised: 08/07/2015 Document Reviewed: 04/05/2014 Elsevier Interactive Patient Education  2017 [employer].

## 2022-05-10 DIAGNOSIS — C44329 Squamous cell carcinoma of skin of other parts of face: Secondary | ICD-10-CM | POA: Diagnosis not present

## 2022-06-10 DIAGNOSIS — N1831 Chronic kidney disease, stage 3a: Secondary | ICD-10-CM | POA: Diagnosis not present

## 2022-06-10 DIAGNOSIS — I10 Essential (primary) hypertension: Secondary | ICD-10-CM | POA: Diagnosis not present

## 2022-06-10 DIAGNOSIS — E559 Vitamin D deficiency, unspecified: Secondary | ICD-10-CM | POA: Diagnosis not present

## 2022-08-30 DIAGNOSIS — E860 Dehydration: Secondary | ICD-10-CM | POA: Diagnosis not present

## 2022-08-30 DIAGNOSIS — R55 Syncope and collapse: Secondary | ICD-10-CM | POA: Diagnosis not present

## 2022-08-30 DIAGNOSIS — N183 Chronic kidney disease, stage 3 unspecified: Secondary | ICD-10-CM | POA: Diagnosis not present

## 2022-08-30 DIAGNOSIS — I959 Hypotension, unspecified: Secondary | ICD-10-CM | POA: Diagnosis not present

## 2022-08-30 DIAGNOSIS — A419 Sepsis, unspecified organism: Secondary | ICD-10-CM | POA: Diagnosis not present

## 2022-08-30 DIAGNOSIS — J189 Pneumonia, unspecified organism: Secondary | ICD-10-CM | POA: Diagnosis not present

## 2022-08-30 DIAGNOSIS — E8721 Acute metabolic acidosis: Secondary | ICD-10-CM | POA: Diagnosis not present

## 2022-08-30 DIAGNOSIS — R000 Tachycardia, unspecified: Secondary | ICD-10-CM | POA: Diagnosis not present

## 2022-08-30 DIAGNOSIS — I5022 Chronic systolic (congestive) heart failure: Secondary | ICD-10-CM | POA: Diagnosis not present

## 2022-08-30 DIAGNOSIS — I130 Hypertensive heart and chronic kidney disease with heart failure and stage 1 through stage 4 chronic kidney disease, or unspecified chronic kidney disease: Secondary | ICD-10-CM | POA: Diagnosis not present

## 2022-08-30 DIAGNOSIS — R0981 Nasal congestion: Secondary | ICD-10-CM | POA: Diagnosis not present

## 2022-08-30 DIAGNOSIS — R918 Other nonspecific abnormal finding of lung field: Secondary | ICD-10-CM | POA: Diagnosis not present

## 2022-08-30 DIAGNOSIS — R079 Chest pain, unspecified: Secondary | ICD-10-CM | POA: Diagnosis not present

## 2022-08-30 DIAGNOSIS — I4892 Unspecified atrial flutter: Secondary | ICD-10-CM | POA: Diagnosis not present

## 2022-08-30 DIAGNOSIS — Z87891 Personal history of nicotine dependence: Secondary | ICD-10-CM | POA: Diagnosis not present

## 2022-08-30 DIAGNOSIS — R059 Cough, unspecified: Secondary | ICD-10-CM | POA: Diagnosis not present

## 2022-08-30 DIAGNOSIS — D696 Thrombocytopenia, unspecified: Secondary | ICD-10-CM | POA: Diagnosis not present

## 2022-08-30 DIAGNOSIS — R509 Fever, unspecified: Secondary | ICD-10-CM | POA: Diagnosis not present

## 2022-08-30 DIAGNOSIS — I5020 Unspecified systolic (congestive) heart failure: Secondary | ICD-10-CM | POA: Diagnosis not present

## 2022-08-30 DIAGNOSIS — N179 Acute kidney failure, unspecified: Secondary | ICD-10-CM | POA: Diagnosis not present

## 2022-08-30 DIAGNOSIS — E872 Acidosis, unspecified: Secondary | ICD-10-CM | POA: Diagnosis not present

## 2022-08-30 DIAGNOSIS — D72829 Elevated white blood cell count, unspecified: Secondary | ICD-10-CM | POA: Diagnosis not present

## 2022-08-30 DIAGNOSIS — J984 Other disorders of lung: Secondary | ICD-10-CM | POA: Diagnosis not present

## 2022-08-30 DIAGNOSIS — R0989 Other specified symptoms and signs involving the circulatory and respiratory systems: Secondary | ICD-10-CM | POA: Diagnosis not present

## 2022-08-30 DIAGNOSIS — I4891 Unspecified atrial fibrillation: Secondary | ICD-10-CM | POA: Diagnosis not present

## 2022-08-31 DIAGNOSIS — A419 Sepsis, unspecified organism: Secondary | ICD-10-CM | POA: Diagnosis not present

## 2022-08-31 DIAGNOSIS — N183 Chronic kidney disease, stage 3 unspecified: Secondary | ICD-10-CM | POA: Diagnosis not present

## 2022-08-31 DIAGNOSIS — E8721 Acute metabolic acidosis: Secondary | ICD-10-CM | POA: Diagnosis not present

## 2022-08-31 DIAGNOSIS — J189 Pneumonia, unspecified organism: Secondary | ICD-10-CM | POA: Diagnosis not present

## 2022-08-31 DIAGNOSIS — N179 Acute kidney failure, unspecified: Secondary | ICD-10-CM | POA: Diagnosis not present

## 2022-08-31 DIAGNOSIS — R000 Tachycardia, unspecified: Secondary | ICD-10-CM | POA: Diagnosis not present

## 2022-09-01 DIAGNOSIS — J189 Pneumonia, unspecified organism: Secondary | ICD-10-CM | POA: Diagnosis not present

## 2022-09-01 DIAGNOSIS — A419 Sepsis, unspecified organism: Secondary | ICD-10-CM | POA: Diagnosis not present

## 2022-09-01 DIAGNOSIS — N179 Acute kidney failure, unspecified: Secondary | ICD-10-CM | POA: Diagnosis not present

## 2022-09-01 DIAGNOSIS — N183 Chronic kidney disease, stage 3 unspecified: Secondary | ICD-10-CM | POA: Diagnosis not present

## 2022-09-02 DIAGNOSIS — A419 Sepsis, unspecified organism: Secondary | ICD-10-CM | POA: Diagnosis not present

## 2022-09-02 DIAGNOSIS — N183 Chronic kidney disease, stage 3 unspecified: Secondary | ICD-10-CM | POA: Diagnosis not present

## 2022-09-02 DIAGNOSIS — N179 Acute kidney failure, unspecified: Secondary | ICD-10-CM | POA: Diagnosis not present

## 2022-09-02 DIAGNOSIS — J189 Pneumonia, unspecified organism: Secondary | ICD-10-CM | POA: Diagnosis not present

## 2022-09-02 DIAGNOSIS — I5020 Unspecified systolic (congestive) heart failure: Secondary | ICD-10-CM | POA: Diagnosis not present

## 2022-09-03 DIAGNOSIS — N183 Chronic kidney disease, stage 3 unspecified: Secondary | ICD-10-CM | POA: Diagnosis not present

## 2022-09-03 DIAGNOSIS — J189 Pneumonia, unspecified organism: Secondary | ICD-10-CM | POA: Diagnosis not present

## 2022-09-03 DIAGNOSIS — A419 Sepsis, unspecified organism: Secondary | ICD-10-CM | POA: Diagnosis not present

## 2022-09-03 DIAGNOSIS — I5022 Chronic systolic (congestive) heart failure: Secondary | ICD-10-CM | POA: Diagnosis not present

## 2022-09-10 ENCOUNTER — Ambulatory Visit (INDEPENDENT_AMBULATORY_CARE_PROVIDER_SITE_OTHER): Payer: Medicare HMO | Admitting: Family Medicine

## 2022-09-10 ENCOUNTER — Encounter: Payer: Self-pay | Admitting: Family Medicine

## 2022-09-10 VITALS — BP 110/70 | HR 66 | Ht 75.0 in | Wt 252.0 lb

## 2022-09-10 DIAGNOSIS — N179 Acute kidney failure, unspecified: Secondary | ICD-10-CM | POA: Diagnosis not present

## 2022-09-10 DIAGNOSIS — I483 Typical atrial flutter: Secondary | ICD-10-CM | POA: Diagnosis not present

## 2022-09-10 DIAGNOSIS — J189 Pneumonia, unspecified organism: Secondary | ICD-10-CM

## 2022-09-10 DIAGNOSIS — Z1211 Encounter for screening for malignant neoplasm of colon: Secondary | ICD-10-CM

## 2022-09-10 NOTE — Progress Notes (Unsigned)
Date:  09/10/2022   Name:  Christopher [name]   DOB:  27-Jan-1946   MRN:  [id number]   Chief Complaint: Hospitalization Follow-up (Admitted on 6/17 and d/c on 6/21. TOC on 6/24 by [facility] nurse)  Follow up Hospitalization  Patient was admitted to [facility] on 6/18 and discharged on 6/21. He was treated for pneumonia. Treatment for this included antibiotics. Telephone follow up was done on 6/24 He reports excellent compliance with treatment. He reports this condition is improved.  ----------------------------------------------------------------------------------------- -     Pneumonia There is no chest tightness, cough, sputum production or wheezing. This is a new problem. The current episode started 1 to 4 weeks ago. The problem has been gradually improving. Associated symptoms include malaise/fatigue. Pertinent negatives include no appetite change, chest pain, fever, orthopnea, PND, postnasal drip, rhinorrhea, sneezing or weight loss. His symptoms are aggravated by nothing. He reports moderate improvement on treatment. There are no known risk factors for lung disease.    Lab Results  Component Value Date   NA 138 07/27/2021   K 4.5 07/27/2021   CO2 19 (L) 07/27/2021   GLUCOSE 87 07/27/2021   BUN 26 (H) 07/27/2021   CREATININE 1.51 (H) 07/27/2021   CALCIUM 8.8 (L) 07/27/2021   GFRNONAA 48 (L) 07/27/2021   Lab Results  Component Value Date   CHOL 94 (L) 05/18/2021   HDL 27 (L) 05/18/2021   LDLCALC 47 05/18/2021   TRIG 108 05/18/2021   CHOLHDL 5.0 01/24/2018   Lab Results  Component Value Date   TSH 3.423 06/12/2014   Lab Results  Component Value Date   HGBA1C 4.8 07/08/2020   Lab Results  Component Value Date   WBC 10.5 12/25/2019   HGB 12.8 (L) 12/25/2019   HCT 38.4 12/25/2019   MCV 90 12/25/2019   PLT 244 12/25/2019   Lab Results  Component Value Date   ALT 37 05/18/2021   AST 31 05/18/2021   ALKPHOS 176 (H) 05/18/2021   BILITOT 0.3 05/18/2021    No results found for: "25OHVITD2", "25OHVITD3", "VD25OH"   Review of Systems  Constitutional:  Positive for malaise/fatigue. Negative for appetite change, fever and weight loss.  HENT:  Negative for postnasal drip, rhinorrhea and sneezing.   Respiratory:  Negative for cough, sputum production and wheezing.   Cardiovascular:  Negative for chest pain, palpitations and PND.  Gastrointestinal:  Negative for abdominal distention, abdominal pain, blood in stool and constipation.  Endocrine: Negative for polydipsia and polyuria.  Genitourinary:  Negative for difficulty urinating.    Patient Active Problem List   Diagnosis Date Noted   Severe pulmonary hypertension (HCC) 07/07/2020   Metabolic acidosis 07/07/2020   Hyponatremia 07/07/2020   Hyperkalemia 11/14/2019   Family history of colon cancer    Polyp of colon    Ischemic cardiomyopathy 12/19/2018   Acute renal failure superimposed on stage 3 chronic kidney disease (HCC) 10/14/2018   Atrial flutter (HCC) 10/14/2018   Chronic systolic heart failure (HCC) 10/14/2018   History of TIA (transient ischemic attack) 10/14/2018   Volume overload 10/14/2018   Cerebral vascular disease 09/08/2018   TIA (transient ischemic attack) 12/12/2017   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   Encounter for general adult medical examination without abnormal findings 10/22/2014   Disorder of kidney and ureter 10/22/2014   Renal colic 10/22/2014   Coronary artery abnormality 10/22/2014   Creatinine elevation 10/22/2014   Essential (primary) hypertension 10/22/2014   H/O hypercholesterolemia 10/22/2014   Dysmetabolic syndrome  10/22/2014   H/O acute myocardial infarction 10/22/2014   Adiposity 10/22/2014   Impaired renal function 10/22/2014   Need for vaccination 10/22/2014   Status post total right knee replacement 06/20/2014   S/P coronary artery stent placement 04/09/2014   Primary osteoarthritis of right knee 03/26/2014    Chronic kidney disease, stage [name] (moderate) (HCC) 01/25/2011    No Known Allergies  Past Surgical History:  Procedure Laterality Date   CARDIAC CATHETERIZATION  2000   stent    COLONOSCOPY  2011   COLONOSCOPY WITH PROPOFOL N/A 09/25/2019   Procedure: COLONOSCOPY WITH PROPOFOL;  Surgeon: Christopher Spillers, MD;  Location: [facility];  Service: Endoscopy;  Laterality: N/A;  priority 4   JOINT REPLACEMENT Right    knee   KNEE ARTHROSCOPY Right 2016   POLYPECTOMY N/A 09/25/2019   Procedure: POLYPECTOMY;  Surgeon: Christopher Spillers, MD;  Location: [facility];  Service: Endoscopy;  Laterality: N/A;    Social History   Tobacco Use   Smoking status: Former    Types: Cigarettes    Quit date: 1982    Years since quitting: 42.5   Smokeless tobacco: Never  Vaping Use   Vaping Use: Never used  Substance Use Topics   Alcohol use: No    Alcohol/week: 0.0 standard drinks of alcohol   Drug use: No     Medication list has been reviewed and updated.  Current Meds  Medication Sig   allopurinol (ZYLOPRIM) 100 MG tablet Take 1 tablet by mouth daily   atorvastatin (LIPITOR) 80 MG tablet Take 1 tablet by mouth daily.   dapagliflozin propanediol (FARXIGA) 10 MG TABS tablet Take 1 tablet by mouth daily. [facility]   diclofenac sodium (VOLTAREN) 1 % GEL Apply 4 g topically 4 (four) times daily.   ENTRESTO 97-103 MG Take 1 tablet by mouth 2 (two) times daily.   metoprolol succinate (TOPROL-XL) 100 MG 24 hr tablet Take 100 mg by mouth daily.   spironolactone (ALDACTONE) 25 MG tablet Take 1 tablet by mouth daily. cardio   XARELTO 20 MG TABS tablet Take 20 mg by mouth daily.       09/10/2022    2:00 PM 07/27/2021    1:25 PM 05/18/2021    2:26 PM 07/18/2020    1:32 PM  GAD 7 : Generalized Anxiety Score  Nervous, Anxious, on Edge 0 0 0 0  Control/stop worrying 0 0 0 0  Worry too much - different things 0 0 0 0  Trouble relaxing 0 0 0 0  Restless 0 0 0 0  Easily annoyed or  irritable 0 0 0 0  Afraid - awful might happen 0 0 0 0  Total GAD 7 Score 0 0 0 0  Anxiety Difficulty Not difficult at all Not difficult at all Not difficult at all        09/10/2022    2:00 PM 04/21/2022    9:03 AM 07/27/2021    1:25 PM  Depression screen PHQ 2/9  Decreased Interest 0 0 0  Down, Depressed, Hopeless 0 0 0  PHQ - 2 Score 0 0 0  Altered sleeping 0 0 0  Tired, decreased energy 0 0 0  Change in appetite 0 0 0  Feeling bad or failure about yourself  0 0 0  Trouble concentrating 0 0 0  Moving slowly or fidgety/restless 0 0 0  Suicidal thoughts 0 0 0  PHQ-9 Score 0 0 0  Difficult doing work/chores Not difficult at  all Not difficult at all Not difficult at all    BP Readings from Last 3 Encounters:  09/10/22 110/70  07/27/21 108/80  05/18/21 110/74    Physical Exam Vitals and nursing note reviewed.  HENT:     Head: Normocephalic.     Right Ear: Tympanic membrane and external ear normal.     Left Ear: Tympanic membrane and external ear normal.     Nose: Nose normal. No congestion or rhinorrhea.     Mouth/Throat:     Mouth: Mucous membranes are moist.     Pharynx: No oropharyngeal exudate.  Eyes:     General: No scleral icterus.       Right eye: No discharge.        Left eye: No discharge.     Conjunctiva/sclera: Conjunctivae normal.     Pupils: Pupils are equal, round, and reactive to light.  Neck:     Thyroid: No thyromegaly.     Vascular: No JVD.     Trachea: No tracheal deviation.  Cardiovascular:     Rate and Rhythm: Normal rate and regular rhythm.     Heart sounds: Normal heart sounds. No murmur heard.    No friction rub. No gallop.  Pulmonary:     Effort: No respiratory distress.     Breath sounds: Normal breath sounds. No wheezing, rhonchi or rales.  Abdominal:     General: Bowel sounds are normal.     Palpations: Abdomen is soft. There is no mass.     Tenderness: There is no abdominal tenderness. There is no guarding or rebound.   Musculoskeletal:        General: No tenderness. Normal range of motion.     Cervical back: Normal range of motion and neck supple.  Lymphadenopathy:     Cervical: No cervical adenopathy.  Skin:    General: Skin is warm.     Findings: No rash.  Neurological:     Mental Status: He is alert and oriented to person, place, and time.     Cranial Nerves: No cranial nerve deficit.     Deep Tendon Reflexes: Reflexes are normal and symmetric.     Wt Readings from Last 3 Encounters:  09/10/22 252 lb (114.3 kg)  04/21/22 239 lb (108.4 kg)  07/27/21 239 lb (108.4 kg)    BP 110/70   Pulse 66   Ht 6\' 3"  (1.905 m)   Wt 252 lb (114.3 kg)   SpO2 97%   BMI 31.50 kg/m  [facility] [place] [facility]                                   Transitional Care Clinic   [facility] Discharge Acute Issues Care Follow Up                                                                        Patient Demographics  Christopher [name], is a 77 y.o. male  DOB May 01, 1945  MRN [id number].  Primary MD  Duanne Limerick, MD   Reason for TCC follow Up -recheck  patient status post pneumonia with outpatient antibiotic therapy.   Past Medical History:  Diagnosis Date   Arthritis    ankles, hands, left knee   CHF (congestive heart failure) (HCC)    Chronic kidney disease (CKD), stage [name] (moderate) (HCC)    Coronary artery disease    Gout    Hyperlipidemia    Hypertension    Myocardial infarction (HCC) 2000   TIA (transient ischemic attack) 2003   Wears dentures    full upper    Past Surgical History:  Procedure Laterality Date   CARDIAC CATHETERIZATION  2000   stent    COLONOSCOPY  2011   COLONOSCOPY WITH PROPOFOL N/A 09/25/2019   Procedure: COLONOSCOPY WITH PROPOFOL;  Surgeon: Christopher Spillers, MD;  Location: [facility];  Service: Endoscopy;  Laterality: N/A;  priority 4   JOINT REPLACEMENT Right    knee   KNEE  ARTHROSCOPY Right 2016   POLYPECTOMY N/A 09/25/2019   Procedure: POLYPECTOMY;  Surgeon: Christopher Spillers, MD;  Location: [facility];  Service: Endoscopy;  Laterality: N/A;   Recent HPI and Hospital course: Patient was recently hospitalized and in a relatively short.  Discharge for home hospitalization care.  Patient had visits by doctors and nurses on 3 times a day scheduled.  This is follow-up from the care that was rendered. Post Hospital acute care issue to be followed in clinic I assume that it is for me to recheck patient since he has been at home and to make sure that his lungs are clear which they are and that he is no longer demonstrating symptoms of sepsis such as fever and chills.        Subjective:   Trixie Deis today has, No headache, No chest pain, No abdominal pain - No Nausea, No new weakness tingling or numbness, No Cough - SOB, fever, chills, diaphoresis, dizziness, and dyspnea on exertion.      Objective:   Vitals:   09/10/22 1353  BP: 110/70  Pulse: 66  SpO2: 97%  Weight: 252 lb (114.3 kg)  Height: 6\' 3"  (1.905 m)    Wt Readings from Last 3 Encounters:  09/10/22 252 lb (114.3 kg)  04/21/22 239 lb (108.4 kg)  07/27/21 239 lb (108.4 kg)    Allergies as of 09/10/2022   No Known Allergies      Medication List        Accurate as of September 10, 2022  2:30 PM. If you have any questions, ask your nurse or doctor.          allopurinol 100 MG tablet Commonly known as: ZYLOPRIM Take 1 tablet by mouth daily   atorvastatin 80 MG tablet Commonly known as: LIPITOR Take 1 tablet by mouth daily.   dapagliflozin propanediol 10 MG Tabs tablet Commonly known as: FARXIGA Take 1 tablet by mouth daily. [facility]   diclofenac sodium 1 % Gel Commonly known as: VOLTAREN Apply 4 g topically 4 (four) times daily.   Entresto 97-103 MG Generic drug: sacubitril-valsartan Take 1 tablet by mouth 2 (two) times daily.   metoprolol succinate 100 MG 24 hr  tablet Commonly known as: TOPROL-XL Take 100 mg by mouth daily.   sodium bicarbonate 650 MG tablet Take 650 mg by mouth 2 (two) times daily.   spironolactone 25 MG tablet Commonly known as: ALDACTONE Take 1 tablet by mouth daily. cardio   Xarelto 20 MG Tabs tablet Generic drug: rivaroxaban Take 20 mg by mouth daily.  Physical Exam: Constitutional: Patient appears well-developed and well-nourished. Not in obvious distress. HENT: Normocephalic, atraumatic, External right and left ear normal. Oropharynx is clear and moist.  Eyes: Conjunctivae and EOM are normal. PERRLA, no scleral icterus. Neck: Normal ROM. Neck supple. No JVD. No tracheal deviation. No thyromegaly. CVS: RRR, S1/S2 +, no murmurs, no gallops, no carotid bruit.  Pulmonary: Effort and breath sounds normal, no stridor, rhonchi, wheezes, rales.  Abdominal: Soft. BS +, no distension, tenderness, rebound or guarding.  Musculoskeletal: Normal range of motion. No edema and no tenderness.  Lymphadenopathy: No lymphadenopathy noted, cervical, inguinal or axillary Neuro: Alert. Normal reflexes, muscle tone coordination. No cranial nerve deficit. Skin: Skin is warm and dry. No rash noted. Not diaphoretic. No erythema. No pallor. Psychiatric: Normal mood and affect. Behavior, judgment, thought content normal.   Data Review   Micro Results No results found for this or any previous visit (from the past 240 hour(s)).   CBC No results for input(s): "WBC", "HGB", "HCT", "PLT", "MCV", "MCH", "MCHC", "RDW", "LYMPHSABS", "MONOABS", "EOSABS", "BASOSABS", "BANDABS" in the last 168 hours.  Invalid input(s): "NEUTRABS", "BANDSABD"  Chemistries  No results for input(s): "NA", "K", "CL", "CO2", "GLUCOSE", "BUN", "CREATININE", "CALCIUM", "MG", "AST", "ALT", "ALKPHOS", "BILITOT" in the last 168 hours.  Invalid input(s):  "GFRCGP" ------------------------------------------------------------------------------------------------------------------ CrCl cannot be calculated (Patient's most recent lab result is older than the maximum 21 days allowed.). ------------------------------------------------------------------------------------------------------------------ No results for input(s): "HGBA1C" in the last 72 hours. ------------------------------------------------------------------------------------------------------------------ No results for input(s): "CHOL", "HDL", "LDLCALC", "TRIG", "CHOLHDL", "LDLDIRECT" in the last 72 hours. ------------------------------------------------------------------------------------------------------------------ No results for input(s): "TSH", "T4TOTAL", "T3FREE", "THYROIDAB" in the last 72 hours.  Invalid input(s): "FREET3" ------------------------------------------------------------------------------------------------------------------ No results for input(s): "VITAMINB12", "FOLATE", "FERRITIN", "TIBC", "IRON", "RETICCTPCT" in the last 72 hours.  Coagulation profile No results for input(s): "INR", "PROTIME" in the last 168 hours.  No results for input(s): "DDIMER" in the last 72 hours.  Cardiac Enzymes No results for input(s): "CKMB", "TROPONINI", "MYOGLOBIN" in the last 168 hours.  Invalid input(s): "CK" ------------------------------------------------------------------------------------------------------------------ Time spent in minutes: 40 minutes      Elizabeth Sauer M.D on 09/10/2022 at 2:30 PM   **Disclaimer: This note may have been dictated with voice recognition software. Similar sounding words can inadvertently be transcribed and this note may contain transcription errors which may not have been corrected upon publication of note.**   Assessment and Plan:  1. Pneumonia of left lower lobe due to infectious organism Status post admission for pneumonia and left  lower lobe.  Patient has been doing well on outpatient antibiotics rendered by hospitalization team.  We will check CBC for leukocytosis. - CBC with Differential/Platelet  2. AKI (acute kidney injury) (HCC) New onset.  Resolving.  Patient has been receiving hydration at home via oral method.  We will check renal function panel for GFR and electrolytes. - Renal Function Panel  3. Typical atrial flutter (HCC) New onset.  Episodic.  Stable.  Patient developed atrial flutter with tach arrhythmia for which he was started on rate reduction therapy consisting of metoprolol 100 mg XL.  4. Colon cancer screening Discussed with patient and patient has agreed to do FIT testing for evaluation of possibility of colon cancer. - Fecal occult blood, imunochemical([facility])    Elizabeth Sauer, MD

## 2022-09-11 ENCOUNTER — Other Ambulatory Visit: Payer: Self-pay | Admitting: Family Medicine

## 2022-09-11 ENCOUNTER — Encounter: Payer: Self-pay | Admitting: Family Medicine

## 2022-09-11 LAB — CBC WITH DIFFERENTIAL/PLATELET
Basophils Absolute: 0.1 10*3/uL (ref 0.0–0.2)
Basos: 1 %
EOS (ABSOLUTE): 0.1 10*3/uL (ref 0.0–0.4)
Eos: 2 %
Hematocrit: 43.3 % (ref 37.5–51.0)
Hemoglobin: 13.8 g/dL (ref 13.0–17.7)
Immature Grans (Abs): 0 10*3/uL (ref 0.0–0.1)
Immature Granulocytes: 0 %
Lymphocytes Absolute: 2.7 10*3/uL (ref 0.7–3.1)
Lymphs: 30 %
MCH: 29.7 pg (ref 26.6–33.0)
MCHC: 31.9 g/dL (ref 31.5–35.7)
MCV: 93 fL (ref 79–97)
Monocytes Absolute: 0.7 10*3/uL (ref 0.1–0.9)
Monocytes: 8 %
Neutrophils Absolute: 5.6 10*3/uL (ref 1.4–7.0)
Neutrophils: 59 %
Platelets: 214 10*3/uL (ref 150–450)
RBC: 4.65 x10E6/uL (ref 4.14–5.80)
RDW: 13.2 % (ref 11.6–15.4)
WBC: 9.2 10*3/uL (ref 3.4–10.8)

## 2022-09-11 LAB — RENAL FUNCTION PANEL
Albumin: 4.1 g/dL (ref 3.8–4.8)
BUN/Creatinine Ratio: 16 (ref 10–24)
BUN: 23 mg/dL (ref 8–27)
CO2: 18 mmol/L — ABNORMAL LOW (ref 20–29)
Calcium: 9 mg/dL (ref 8.6–10.2)
Chloride: 108 mmol/L — ABNORMAL HIGH (ref 96–106)
Creatinine, Ser: 1.46 mg/dL — ABNORMAL HIGH (ref 0.76–1.27)
Glucose: 81 mg/dL (ref 70–99)
Phosphorus: 2.8 mg/dL (ref 2.8–4.1)
Potassium: 4.4 mmol/L (ref 3.5–5.2)
Sodium: 143 mmol/L (ref 134–144)
eGFR: 50 mL/min/{1.73_m2} — ABNORMAL LOW (ref >59)

## 2022-09-13 ENCOUNTER — Other Ambulatory Visit: Payer: Self-pay | Admitting: Family Medicine

## 2022-09-20 ENCOUNTER — Telehealth: Payer: Self-pay

## 2022-09-20 NOTE — Telephone Encounter (Signed)
Called and spoke to pt concerning turning the FIT test in

## 2022-09-24 ENCOUNTER — Ambulatory Visit (INDEPENDENT_AMBULATORY_CARE_PROVIDER_SITE_OTHER): Payer: Medicare HMO | Admitting: Family Medicine

## 2022-09-24 ENCOUNTER — Ambulatory Visit: Admission: RE | Admit: 2022-09-24 | Payer: Medicare HMO | Source: Ambulatory Visit

## 2022-09-24 ENCOUNTER — Encounter: Payer: Self-pay | Admitting: Family Medicine

## 2022-09-24 ENCOUNTER — Ambulatory Visit
Admission: RE | Admit: 2022-09-24 | Discharge: 2022-09-24 | Disposition: A | Discharge status: Home / Self Care | Payer: Medicare HMO | Attending: Family Medicine | Admitting: Family Medicine

## 2022-09-24 ENCOUNTER — Other Ambulatory Visit: Payer: Self-pay | Admitting: Family Medicine

## 2022-09-24 VITALS — BP 120/78 | HR 90 | Ht 75.0 in | Wt 254.0 lb

## 2022-09-24 DIAGNOSIS — I700 Atherosclerosis of aorta: Secondary | ICD-10-CM | POA: Diagnosis not present

## 2022-09-24 DIAGNOSIS — J189 Pneumonia, unspecified organism: Secondary | ICD-10-CM

## 2022-09-24 NOTE — Progress Notes (Signed)
Date:  09/24/2022   Name:  Christopher [name]   DOB:  12-18-1945   MRN:  [id number]   Chief Complaint: Pneumonia (Follow up pneumonia- "cleared up, getting a little bit of phlegm when I wake up")  Pneumonia He complains of sputum production. There is no chest tightness, difficulty breathing, hemoptysis, shortness of breath or wheezing. This is a recurrent problem. The current episode started more than 1 month ago. The problem has been gradually improving. Pertinent negatives include no chest pain, dyspnea on exertion, orthopnea, PND, sneezing or sore throat. Relieved by: hospitalization with antibiotics. He reports significant improvement on treatment.    Lab Results  Component Value Date   NA 143 09/10/2022   K 4.4 09/10/2022   CO2 18 (L) 09/10/2022   GLUCOSE 81 09/10/2022   BUN 23 09/10/2022   CREATININE 1.46 (H) 09/10/2022   CALCIUM 9.0 09/10/2022   EGFR 50 (L) 09/10/2022   GFRNONAA 48 (L) 07/27/2021   Lab Results  Component Value Date   CHOL 94 (L) 05/18/2021   HDL 27 (L) 05/18/2021   LDLCALC 47 05/18/2021   TRIG 108 05/18/2021   CHOLHDL 5.0 01/24/2018   Lab Results  Component Value Date   TSH 3.423 06/12/2014   Lab Results  Component Value Date   HGBA1C 4.8 07/08/2020   Lab Results  Component Value Date   WBC 9.2 09/10/2022   HGB 13.8 09/10/2022   HCT 43.3 09/10/2022   MCV 93 09/10/2022   PLT 214 09/10/2022   Lab Results  Component Value Date   ALT 37 05/18/2021   AST 31 05/18/2021   ALKPHOS 176 (H) 05/18/2021   BILITOT 0.3 05/18/2021   No results found for: "25OHVITD2", "25OHVITD3", "VD25OH"   Review of Systems  HENT:  Negative for sneezing and sore throat.   Respiratory:  Positive for sputum production. Negative for hemoptysis, choking, chest tightness, shortness of breath and wheezing.   Cardiovascular:  Negative for chest pain, dyspnea on exertion, palpitations, leg swelling and PND.  Gastrointestinal:  Negative for abdominal pain.  Endocrine:  Negative for polydipsia and polyuria.  Neurological:  Negative for dizziness.    Patient Active Problem List   Diagnosis Date Noted   Severe pulmonary hypertension (HCC) 07/07/2020   Metabolic acidosis 07/07/2020   Hyponatremia 07/07/2020   Hyperkalemia 11/14/2019   Family history of colon cancer    Polyp of colon    Ischemic cardiomyopathy 12/19/2018   Acute renal failure superimposed on stage 3 chronic kidney disease (HCC) 10/14/2018   Atrial flutter (HCC) 10/14/2018   Chronic systolic heart failure (HCC) 10/14/2018   History of TIA (transient ischemic attack) 10/14/2018   Volume overload 10/14/2018   Cerebral vascular disease 09/08/2018   TIA (transient ischemic attack) 12/12/2017   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   Encounter for general adult medical examination without abnormal findings 10/22/2014   Disorder of kidney and ureter 10/22/2014   Renal colic 10/22/2014   Coronary artery abnormality 10/22/2014   Creatinine elevation 10/22/2014   Essential (primary) hypertension 10/22/2014   H/O hypercholesterolemia 10/22/2014   Dysmetabolic syndrome 10/22/2014   H/O acute myocardial infarction 10/22/2014   Adiposity 10/22/2014   Impaired renal function 10/22/2014   Need for vaccination 10/22/2014   Status post total right knee replacement 06/20/2014   S/P coronary artery stent placement 04/09/2014   Primary osteoarthritis of right knee 03/26/2014   Chronic kidney disease, stage [name] (moderate) (HCC) 01/25/2011    No Known Allergies  Past  Surgical History:  Procedure Laterality Date   CARDIAC CATHETERIZATION  2000   stent    COLONOSCOPY  2011   COLONOSCOPY WITH PROPOFOL N/A 09/25/2019   Procedure: COLONOSCOPY WITH PROPOFOL;  Surgeon: Pasty Spillers, MD;  Location: [facility];  Service: Endoscopy;  Laterality: N/A;  priority 4   JOINT REPLACEMENT Right    knee   KNEE ARTHROSCOPY Right 2016   POLYPECTOMY N/A 09/25/2019    Procedure: POLYPECTOMY;  Surgeon: Pasty Spillers, MD;  Location: [facility];  Service: Endoscopy;  Laterality: N/A;    Social History   Tobacco Use   Smoking status: Former    Current packs/day: 0.00    Types: Cigarettes    Quit date: 1982    Years since quitting: 42.5   Smokeless tobacco: Never  Vaping Use   Vaping status: Never Used  Substance Use Topics   Alcohol use: No    Alcohol/week: 0.0 standard drinks of alcohol   Drug use: No     Medication list has been reviewed and updated.  No outpatient medications have been marked as taking for the 09/24/22 encounter (Office Visit) with Duanne Limerick, MD.       09/24/2022    1:57 PM 09/10/2022    2:00 PM 07/27/2021    1:25 PM 05/18/2021    2:26 PM  GAD 7 : Generalized Anxiety Score  Nervous, Anxious, on Edge 0 0 0 0  Control/stop worrying 0 0 0 0  Worry too much - different things 0 0 0 0  Trouble relaxing 0 0 0 0  Restless 0 0 0 0  Easily annoyed or irritable 0 0 0 0  Afraid - awful might happen 0 0 0 0  Total GAD 7 Score 0 0 0 0  Anxiety Difficulty Not difficult at all Not difficult at all Not difficult at all Not difficult at all       09/24/2022    1:57 PM 09/10/2022    2:00 PM 04/21/2022    9:03 AM  Depression screen PHQ 2/9  Decreased Interest 0 0 0  Down, Depressed, Hopeless 0 0 0  PHQ - 2 Score 0 0 0  Altered sleeping 0 0 0  Tired, decreased energy 0 0 0  Change in appetite 0 0 0  Feeling bad or failure about yourself  0 0 0  Trouble concentrating 0 0 0  Moving slowly or fidgety/restless 0 0 0  Suicidal thoughts 0 0 0  PHQ-9 Score 0 0 0  Difficult doing work/chores Not difficult at all Not difficult at all Not difficult at all    BP Readings from Last 3 Encounters:  09/24/22 120/78  09/10/22 110/70  07/27/21 108/80    Physical Exam Vitals and nursing note reviewed.  HENT:     Right Ear: Tympanic membrane and ear canal normal.     Left Ear: Tympanic membrane and ear canal normal.      Mouth/Throat:     Mouth: Mucous membranes are moist.  Eyes:     Pupils: Pupils are equal, round, and reactive to light.  Cardiovascular:     Rate and Rhythm: Regular rhythm. Tachycardia present.     Pulses: Normal pulses.     Heart sounds: No murmur heard.    No friction rub. No gallop.  Pulmonary:     Breath sounds: No decreased air movement. No decreased breath sounds, wheezing, rhonchi or rales.  Abdominal:     General: There is no  distension.     Palpations: There is no mass.     Tenderness: There is no guarding or rebound.  Musculoskeletal:        General: No tenderness. Normal range of motion.     Cervical back: Normal range of motion.  Skin:    General: Skin is warm.  Neurological:     General: No focal deficit present.     Mental Status: He is alert.     Wt Readings from Last 3 Encounters:  09/24/22 254 lb (115.2 kg)  09/10/22 252 lb (114.3 kg)  04/21/22 239 lb (108.4 kg)    BP 120/78   Pulse 90   Ht 6\' 3"  (1.905 m)   Wt 254 lb (115.2 kg)   SpO2 98%   BMI 31.75 kg/m   Assessment and Plan: 1. Pneumonia of left lower lobe due to infectious organism New onset.  Resolving.  Stable.  Review of leukocytosis is returned to normal and EKG review of GFR creatinine is at baseline.  Patient continues to improve both subjectively and objectively.  Breath sounds are heard throughout the lungs without rales rhonchi wheezes or rub.  Will check x-ray to see level of resolution and patient is encouraged to return to clinic on an as-needed basis as far as his pneumonia and we will see him next visit for maintenance medications. - DG Chest 2 View     Elizabeth Sauer, MD

## 2022-09-25 DIAGNOSIS — Z1211 Encounter for screening for malignant neoplasm of colon: Secondary | ICD-10-CM | POA: Diagnosis not present

## 2022-09-29 LAB — FECAL OCCULT BLOOD, IMMUNOCHEMICAL: Fecal Occult Bld: NEGATIVE

## 2022-12-06 DIAGNOSIS — N1831 Chronic kidney disease, stage 3a: Secondary | ICD-10-CM | POA: Diagnosis not present

## 2022-12-13 DIAGNOSIS — N183 Chronic kidney disease, stage 3 unspecified: Secondary | ICD-10-CM | POA: Diagnosis not present

## 2022-12-13 DIAGNOSIS — I10 Essential (primary) hypertension: Secondary | ICD-10-CM | POA: Diagnosis not present

## 2023-01-04 DIAGNOSIS — Z8673 Personal history of transient ischemic attack (TIA), and cerebral infarction without residual deficits: Secondary | ICD-10-CM | POA: Diagnosis not present

## 2023-01-04 DIAGNOSIS — I5022 Chronic systolic (congestive) heart failure: Secondary | ICD-10-CM | POA: Diagnosis not present

## 2023-01-04 DIAGNOSIS — I483 Typical atrial flutter: Secondary | ICD-10-CM | POA: Diagnosis not present

## 2023-01-04 DIAGNOSIS — E782 Mixed hyperlipidemia: Secondary | ICD-10-CM | POA: Diagnosis not present

## 2023-01-04 DIAGNOSIS — N1831 Chronic kidney disease, stage 3a: Secondary | ICD-10-CM | POA: Diagnosis not present

## 2023-01-04 DIAGNOSIS — I2510 Atherosclerotic heart disease of native coronary artery without angina pectoris: Secondary | ICD-10-CM | POA: Diagnosis not present

## 2023-01-04 DIAGNOSIS — I10 Essential (primary) hypertension: Secondary | ICD-10-CM | POA: Diagnosis not present

## 2023-02-01 DIAGNOSIS — R6521 Severe sepsis with septic shock: Secondary | ICD-10-CM | POA: Diagnosis not present

## 2023-02-01 DIAGNOSIS — I5022 Chronic systolic (congestive) heart failure: Secondary | ICD-10-CM | POA: Diagnosis not present

## 2023-02-01 DIAGNOSIS — J189 Pneumonia, unspecified organism: Secondary | ICD-10-CM | POA: Diagnosis not present

## 2023-02-01 DIAGNOSIS — R590 Localized enlarged lymph nodes: Secondary | ICD-10-CM | POA: Diagnosis not present

## 2023-02-01 DIAGNOSIS — E872 Acidosis, unspecified: Secondary | ICD-10-CM | POA: Diagnosis not present

## 2023-02-01 DIAGNOSIS — I509 Heart failure, unspecified: Secondary | ICD-10-CM | POA: Diagnosis not present

## 2023-02-01 DIAGNOSIS — I255 Ischemic cardiomyopathy: Secondary | ICD-10-CM | POA: Diagnosis not present

## 2023-02-01 DIAGNOSIS — Z792 Long term (current) use of antibiotics: Secondary | ICD-10-CM | POA: Diagnosis not present

## 2023-02-01 DIAGNOSIS — Z79899 Other long term (current) drug therapy: Secondary | ICD-10-CM | POA: Diagnosis not present

## 2023-02-01 DIAGNOSIS — R000 Tachycardia, unspecified: Secondary | ICD-10-CM | POA: Diagnosis not present

## 2023-02-01 DIAGNOSIS — R0902 Hypoxemia: Secondary | ICD-10-CM | POA: Diagnosis not present

## 2023-02-01 DIAGNOSIS — R911 Solitary pulmonary nodule: Secondary | ICD-10-CM | POA: Diagnosis not present

## 2023-02-01 DIAGNOSIS — J1289 Other viral pneumonia: Secondary | ICD-10-CM | POA: Diagnosis not present

## 2023-02-01 DIAGNOSIS — N189 Chronic kidney disease, unspecified: Secondary | ICD-10-CM | POA: Diagnosis not present

## 2023-02-01 DIAGNOSIS — R918 Other nonspecific abnormal finding of lung field: Secondary | ICD-10-CM | POA: Diagnosis not present

## 2023-02-01 DIAGNOSIS — I340 Nonrheumatic mitral (valve) insufficiency: Secondary | ICD-10-CM | POA: Diagnosis not present

## 2023-02-01 DIAGNOSIS — R0689 Other abnormalities of breathing: Secondary | ICD-10-CM | POA: Diagnosis not present

## 2023-02-01 DIAGNOSIS — I472 Ventricular tachycardia, unspecified: Secondary | ICD-10-CM | POA: Diagnosis not present

## 2023-02-01 DIAGNOSIS — R531 Weakness: Secondary | ICD-10-CM | POA: Diagnosis not present

## 2023-02-01 DIAGNOSIS — R7989 Other specified abnormal findings of blood chemistry: Secondary | ICD-10-CM | POA: Diagnosis not present

## 2023-02-01 DIAGNOSIS — R509 Fever, unspecified: Secondary | ICD-10-CM | POA: Diagnosis not present

## 2023-02-01 DIAGNOSIS — J811 Chronic pulmonary edema: Secondary | ICD-10-CM | POA: Diagnosis not present

## 2023-02-01 DIAGNOSIS — I2489 Other forms of acute ischemic heart disease: Secondary | ICD-10-CM | POA: Diagnosis not present

## 2023-02-01 DIAGNOSIS — D6869 Other thrombophilia: Secondary | ICD-10-CM | POA: Diagnosis not present

## 2023-02-01 DIAGNOSIS — I5020 Unspecified systolic (congestive) heart failure: Secondary | ICD-10-CM | POA: Diagnosis not present

## 2023-02-01 DIAGNOSIS — A4189 Other specified sepsis: Secondary | ICD-10-CM | POA: Diagnosis not present

## 2023-02-01 DIAGNOSIS — I4892 Unspecified atrial flutter: Secondary | ICD-10-CM | POA: Diagnosis not present

## 2023-02-01 DIAGNOSIS — A419 Sepsis, unspecified organism: Secondary | ICD-10-CM | POA: Diagnosis not present

## 2023-02-01 DIAGNOSIS — I959 Hypotension, unspecified: Secondary | ICD-10-CM | POA: Diagnosis not present

## 2023-02-01 DIAGNOSIS — Z87891 Personal history of nicotine dependence: Secondary | ICD-10-CM | POA: Diagnosis not present

## 2023-02-01 DIAGNOSIS — R051 Acute cough: Secondary | ICD-10-CM | POA: Diagnosis not present

## 2023-02-07 ENCOUNTER — Telehealth: Payer: Self-pay

## 2023-02-07 NOTE — Transitions of Care (Post Inpatient/ED Visit) (Signed)
   02/07/2023  Name: Christopher [name] MRN: [id number] DOB: September 14, 1945  Today's TOC FU Call Status: Today's TOC FU Call Status:: Unsuccessful Call (1st Attempt) Unsuccessful Call (1st Attempt) Date: 02/07/23  Attempted to reach the patient regarding the most recent Inpatient/ED visit.  Follow Up Plan: Additional outreach attempts will be made to reach the patient to complete the Transitions of Care (Post Inpatient/ED visit) call.    Susa Loffler , BSN, RN Care Management Coordinator [place]   [facility] [email or web address] Direct Dial: [phone]

## 2023-02-08 ENCOUNTER — Telehealth: Payer: Self-pay

## 2023-02-08 NOTE — Transitions of Care (Post Inpatient/ED Visit) (Signed)
   02/08/2023  Name: TYON LIBERT III MRN: [id number] DOB: 22-Sep-1945  Today's TOC FU Call Status: Today's TOC FU Call Status:: Unsuccessful Call (2nd Attempt) Unsuccessful Call (1st Attempt) Date: 02/07/23 Unsuccessful Call (2nd Attempt) Date: 02/08/23  Attempted to reach the patient regarding the most recent Inpatient/ED visit.  Follow Up Plan: Additional outreach attempts will be made to reach the patient to complete the Transitions of Care (Post Inpatient/ED visit) call.    Susa Loffler , BSN, RN Care Management Coordinator [place]   [facility] [email or web address] Direct Dial: [phone]

## 2023-02-09 ENCOUNTER — Telehealth: Payer: Self-pay

## 2023-02-09 NOTE — Transitions of Care (Post Inpatient/ED Visit) (Signed)
   02/09/2023  Name: Christopher [name] MRN: [id number] DOB: 1945-11-15  Today's TOC FU Call Status: Today's TOC FU Call Status:: Unsuccessful Call (3rd Attempt) Unsuccessful Call (1st Attempt) Date: 02/07/23 Unsuccessful Call (2nd Attempt) Date: 02/08/23 Unsuccessful Call (3rd Attempt) Date: 02/09/23  Attempted to reach the patient regarding the most recent Inpatient/ED visit.  Follow Up Plan: No further outreach attempts will be made at this time. We have been unable to contact the patient.   Susa Loffler , BSN, RN Care Management Coordinator [place]   [facility] [email or web address] Direct Dial: [phone]

## 2023-03-02 DIAGNOSIS — R509 Fever, unspecified: Secondary | ICD-10-CM | POA: Diagnosis not present

## 2023-03-03 DIAGNOSIS — I2510 Atherosclerotic heart disease of native coronary artery without angina pectoris: Secondary | ICD-10-CM | POA: Diagnosis not present

## 2023-03-03 DIAGNOSIS — R918 Other nonspecific abnormal finding of lung field: Secondary | ICD-10-CM | POA: Diagnosis not present

## 2023-03-03 DIAGNOSIS — I517 Cardiomegaly: Secondary | ICD-10-CM | POA: Diagnosis not present

## 2023-03-03 DIAGNOSIS — R590 Localized enlarged lymph nodes: Secondary | ICD-10-CM | POA: Diagnosis not present

## 2023-03-03 DIAGNOSIS — K8020 Calculus of gallbladder without cholecystitis without obstruction: Secondary | ICD-10-CM | POA: Diagnosis not present

## 2023-04-13 DIAGNOSIS — I130 Hypertensive heart and chronic kidney disease with heart failure and stage 1 through stage 4 chronic kidney disease, or unspecified chronic kidney disease: Secondary | ICD-10-CM | POA: Diagnosis not present

## 2023-04-13 DIAGNOSIS — E871 Hypo-osmolality and hyponatremia: Secondary | ICD-10-CM | POA: Diagnosis not present

## 2023-04-13 DIAGNOSIS — I272 Pulmonary hypertension, unspecified: Secondary | ICD-10-CM | POA: Diagnosis not present

## 2023-04-13 DIAGNOSIS — I2510 Atherosclerotic heart disease of native coronary artery without angina pectoris: Secondary | ICD-10-CM | POA: Diagnosis not present

## 2023-04-13 DIAGNOSIS — R000 Tachycardia, unspecified: Secondary | ICD-10-CM | POA: Diagnosis not present

## 2023-04-13 DIAGNOSIS — I5023 Acute on chronic systolic (congestive) heart failure: Secondary | ICD-10-CM | POA: Diagnosis not present

## 2023-04-13 DIAGNOSIS — I5022 Chronic systolic (congestive) heart failure: Secondary | ICD-10-CM | POA: Diagnosis not present

## 2023-04-13 DIAGNOSIS — E860 Dehydration: Secondary | ICD-10-CM | POA: Diagnosis not present

## 2023-04-13 DIAGNOSIS — N179 Acute kidney failure, unspecified: Secondary | ICD-10-CM | POA: Diagnosis not present

## 2023-04-13 DIAGNOSIS — R5381 Other malaise: Secondary | ICD-10-CM | POA: Diagnosis not present

## 2023-04-13 DIAGNOSIS — I255 Ischemic cardiomyopathy: Secondary | ICD-10-CM | POA: Diagnosis not present

## 2023-04-13 DIAGNOSIS — I959 Hypotension, unspecified: Secondary | ICD-10-CM | POA: Diagnosis not present

## 2023-04-13 DIAGNOSIS — R918 Other nonspecific abnormal finding of lung field: Secondary | ICD-10-CM | POA: Diagnosis not present

## 2023-04-13 DIAGNOSIS — I4891 Unspecified atrial fibrillation: Secondary | ICD-10-CM | POA: Diagnosis not present

## 2023-04-13 DIAGNOSIS — Z79899 Other long term (current) drug therapy: Secondary | ICD-10-CM | POA: Diagnosis not present

## 2023-04-13 DIAGNOSIS — A499 Bacterial infection, unspecified: Secondary | ICD-10-CM | POA: Diagnosis not present

## 2023-04-13 DIAGNOSIS — S52121A Displaced fracture of head of right radius, initial encounter for closed fracture: Secondary | ICD-10-CM | POA: Diagnosis not present

## 2023-04-13 DIAGNOSIS — M6281 Muscle weakness (generalized): Secondary | ICD-10-CM | POA: Diagnosis not present

## 2023-04-13 DIAGNOSIS — E861 Hypovolemia: Secondary | ICD-10-CM | POA: Diagnosis not present

## 2023-04-13 DIAGNOSIS — A4189 Other specified sepsis: Secondary | ICD-10-CM | POA: Diagnosis not present

## 2023-04-13 DIAGNOSIS — A419 Sepsis, unspecified organism: Secondary | ICD-10-CM | POA: Diagnosis not present

## 2023-04-13 DIAGNOSIS — M19021 Primary osteoarthritis, right elbow: Secondary | ICD-10-CM | POA: Diagnosis not present

## 2023-04-13 DIAGNOSIS — Z66 Do not resuscitate: Secondary | ICD-10-CM | POA: Diagnosis not present

## 2023-04-13 DIAGNOSIS — E8721 Acute metabolic acidosis: Secondary | ICD-10-CM | POA: Diagnosis not present

## 2023-04-13 DIAGNOSIS — N183 Chronic kidney disease, stage 3 unspecified: Secondary | ICD-10-CM | POA: Diagnosis not present

## 2023-04-13 DIAGNOSIS — J9811 Atelectasis: Secondary | ICD-10-CM | POA: Diagnosis not present

## 2023-04-13 DIAGNOSIS — D649 Anemia, unspecified: Secondary | ICD-10-CM | POA: Diagnosis not present

## 2023-04-13 DIAGNOSIS — I5020 Unspecified systolic (congestive) heart failure: Secondary | ICD-10-CM | POA: Diagnosis not present

## 2023-04-13 DIAGNOSIS — I482 Chronic atrial fibrillation, unspecified: Secondary | ICD-10-CM | POA: Diagnosis not present

## 2023-04-13 DIAGNOSIS — I340 Nonrheumatic mitral (valve) insufficiency: Secondary | ICD-10-CM | POA: Diagnosis not present

## 2023-04-13 DIAGNOSIS — J90 Pleural effusion, not elsewhere classified: Secondary | ICD-10-CM | POA: Diagnosis not present

## 2023-04-13 DIAGNOSIS — N189 Chronic kidney disease, unspecified: Secondary | ICD-10-CM | POA: Diagnosis not present

## 2023-04-13 DIAGNOSIS — I4892 Unspecified atrial flutter: Secondary | ICD-10-CM | POA: Diagnosis not present

## 2023-04-13 DIAGNOSIS — U071 COVID-19: Secondary | ICD-10-CM | POA: Diagnosis not present

## 2023-04-13 DIAGNOSIS — R590 Localized enlarged lymph nodes: Secondary | ICD-10-CM | POA: Diagnosis not present

## 2023-04-13 DIAGNOSIS — R279 Unspecified lack of coordination: Secondary | ICD-10-CM | POA: Diagnosis not present

## 2023-04-14 DIAGNOSIS — U071 COVID-19: Secondary | ICD-10-CM | POA: Diagnosis not present

## 2023-04-14 DIAGNOSIS — R918 Other nonspecific abnormal finding of lung field: Secondary | ICD-10-CM | POA: Diagnosis not present

## 2023-04-14 DIAGNOSIS — E861 Hypovolemia: Secondary | ICD-10-CM | POA: Diagnosis not present

## 2023-04-14 DIAGNOSIS — I4891 Unspecified atrial fibrillation: Secondary | ICD-10-CM | POA: Diagnosis not present

## 2023-04-14 DIAGNOSIS — A419 Sepsis, unspecified organism: Secondary | ICD-10-CM | POA: Diagnosis not present

## 2023-04-15 DIAGNOSIS — I340 Nonrheumatic mitral (valve) insufficiency: Secondary | ICD-10-CM | POA: Diagnosis not present

## 2023-04-15 DIAGNOSIS — R000 Tachycardia, unspecified: Secondary | ICD-10-CM | POA: Diagnosis not present

## 2023-04-15 DIAGNOSIS — I130 Hypertensive heart and chronic kidney disease with heart failure and stage 1 through stage 4 chronic kidney disease, or unspecified chronic kidney disease: Secondary | ICD-10-CM | POA: Diagnosis not present

## 2023-04-15 DIAGNOSIS — N179 Acute kidney failure, unspecified: Secondary | ICD-10-CM | POA: Diagnosis not present

## 2023-04-15 DIAGNOSIS — U071 COVID-19: Secondary | ICD-10-CM | POA: Diagnosis not present

## 2023-04-15 DIAGNOSIS — A419 Sepsis, unspecified organism: Secondary | ICD-10-CM | POA: Diagnosis not present

## 2023-04-15 DIAGNOSIS — N183 Chronic kidney disease, stage 3 unspecified: Secondary | ICD-10-CM | POA: Diagnosis not present

## 2023-04-15 DIAGNOSIS — I5022 Chronic systolic (congestive) heart failure: Secondary | ICD-10-CM | POA: Diagnosis not present

## 2023-04-16 DIAGNOSIS — U071 COVID-19: Secondary | ICD-10-CM | POA: Diagnosis not present

## 2023-04-16 DIAGNOSIS — A419 Sepsis, unspecified organism: Secondary | ICD-10-CM | POA: Diagnosis not present

## 2023-04-16 DIAGNOSIS — E861 Hypovolemia: Secondary | ICD-10-CM | POA: Diagnosis not present

## 2023-04-17 DIAGNOSIS — U071 COVID-19: Secondary | ICD-10-CM | POA: Diagnosis not present

## 2023-04-17 DIAGNOSIS — E8721 Acute metabolic acidosis: Secondary | ICD-10-CM | POA: Diagnosis not present

## 2023-04-17 DIAGNOSIS — E871 Hypo-osmolality and hyponatremia: Secondary | ICD-10-CM | POA: Diagnosis not present

## 2023-04-17 DIAGNOSIS — A419 Sepsis, unspecified organism: Secondary | ICD-10-CM | POA: Diagnosis not present

## 2023-04-17 DIAGNOSIS — N179 Acute kidney failure, unspecified: Secondary | ICD-10-CM | POA: Diagnosis not present

## 2023-04-18 DIAGNOSIS — I4891 Unspecified atrial fibrillation: Secondary | ICD-10-CM | POA: Diagnosis not present

## 2023-04-18 DIAGNOSIS — I959 Hypotension, unspecified: Secondary | ICD-10-CM | POA: Diagnosis not present

## 2023-04-18 DIAGNOSIS — A419 Sepsis, unspecified organism: Secondary | ICD-10-CM | POA: Diagnosis not present

## 2023-04-18 DIAGNOSIS — R000 Tachycardia, unspecified: Secondary | ICD-10-CM | POA: Diagnosis not present

## 2023-04-18 DIAGNOSIS — U071 COVID-19: Secondary | ICD-10-CM | POA: Diagnosis not present

## 2023-04-20 ENCOUNTER — Encounter: Payer: Self-pay | Admitting: Family Medicine

## 2023-04-20 LAB — CBC AND DIFFERENTIAL
HCT: 29 — AB (ref 41–53)
Hemoglobin: 9.5 — AB (ref 13.5–17.5)
Platelets: 147 10*3/uL — AB (ref 150–400)
WBC: 8.8

## 2023-04-20 LAB — CBC: RBC: 3.34 — AB (ref 3.87–5.11)

## 2023-04-20 LAB — BASIC METABOLIC PANEL
BUN: 31 — AB (ref 4–21)
CO2: 20 (ref 13–22)
Chloride: 107 (ref 99–108)
Creatinine: 1.3 (ref 0.6–1.3)
Glucose: 94
Potassium: 4 meq/L (ref 3.5–5.1)
Sodium: 142 (ref 137–147)

## 2023-04-20 LAB — COMPREHENSIVE METABOLIC PANEL: Calcium: 8.5 — AB (ref 8.7–10.7)

## 2023-04-21 DIAGNOSIS — R590 Localized enlarged lymph nodes: Secondary | ICD-10-CM | POA: Diagnosis not present

## 2023-04-21 DIAGNOSIS — R918 Other nonspecific abnormal finding of lung field: Secondary | ICD-10-CM | POA: Diagnosis not present

## 2023-04-21 DIAGNOSIS — J90 Pleural effusion, not elsewhere classified: Secondary | ICD-10-CM | POA: Diagnosis not present

## 2023-04-21 DIAGNOSIS — I2510 Atherosclerotic heart disease of native coronary artery without angina pectoris: Secondary | ICD-10-CM | POA: Diagnosis not present

## 2023-04-21 DIAGNOSIS — J9811 Atelectasis: Secondary | ICD-10-CM | POA: Diagnosis not present

## 2023-04-21 LAB — LAB REPORT - SCANNED: EGFR: 61

## 2023-04-22 DIAGNOSIS — A419 Sepsis, unspecified organism: Secondary | ICD-10-CM | POA: Diagnosis not present

## 2023-04-22 DIAGNOSIS — M1612 Unilateral primary osteoarthritis, left hip: Secondary | ICD-10-CM | POA: Diagnosis not present

## 2023-04-22 DIAGNOSIS — R279 Unspecified lack of coordination: Secondary | ICD-10-CM | POA: Diagnosis not present

## 2023-04-22 DIAGNOSIS — N179 Acute kidney failure, unspecified: Secondary | ICD-10-CM | POA: Diagnosis not present

## 2023-04-22 DIAGNOSIS — A499 Bacterial infection, unspecified: Secondary | ICD-10-CM | POA: Diagnosis not present

## 2023-04-22 DIAGNOSIS — I4891 Unspecified atrial fibrillation: Secondary | ICD-10-CM | POA: Diagnosis not present

## 2023-04-22 DIAGNOSIS — M1990 Unspecified osteoarthritis, unspecified site: Secondary | ICD-10-CM | POA: Diagnosis not present

## 2023-04-22 DIAGNOSIS — M1712 Unilateral primary osteoarthritis, left knee: Secondary | ICD-10-CM | POA: Diagnosis not present

## 2023-04-22 DIAGNOSIS — Z8616 Personal history of COVID-19: Secondary | ICD-10-CM | POA: Diagnosis not present

## 2023-04-22 DIAGNOSIS — M79662 Pain in left lower leg: Secondary | ICD-10-CM | POA: Diagnosis not present

## 2023-04-22 DIAGNOSIS — I5022 Chronic systolic (congestive) heart failure: Secondary | ICD-10-CM | POA: Diagnosis not present

## 2023-04-22 DIAGNOSIS — R5381 Other malaise: Secondary | ICD-10-CM | POA: Diagnosis not present

## 2023-04-22 DIAGNOSIS — M6281 Muscle weakness (generalized): Secondary | ICD-10-CM | POA: Diagnosis not present

## 2023-04-22 DIAGNOSIS — N183 Chronic kidney disease, stage 3 unspecified: Secondary | ICD-10-CM | POA: Diagnosis not present

## 2023-04-22 DIAGNOSIS — I480 Paroxysmal atrial fibrillation: Secondary | ICD-10-CM | POA: Diagnosis not present

## 2023-04-22 DIAGNOSIS — I10 Essential (primary) hypertension: Secondary | ICD-10-CM | POA: Diagnosis not present

## 2023-04-22 DIAGNOSIS — M1000 Idiopathic gout, unspecified site: Secondary | ICD-10-CM | POA: Diagnosis not present

## 2023-04-22 DIAGNOSIS — E871 Hypo-osmolality and hyponatremia: Secondary | ICD-10-CM | POA: Diagnosis not present

## 2023-04-22 DIAGNOSIS — I959 Hypotension, unspecified: Secondary | ICD-10-CM | POA: Diagnosis not present

## 2023-04-22 DIAGNOSIS — N178 Other acute kidney failure: Secondary | ICD-10-CM | POA: Diagnosis not present

## 2023-04-22 DIAGNOSIS — D381 Neoplasm of uncertain behavior of trachea, bronchus and lung: Secondary | ICD-10-CM | POA: Diagnosis not present

## 2023-04-22 DIAGNOSIS — M8589 Other specified disorders of bone density and structure, multiple sites: Secondary | ICD-10-CM | POA: Diagnosis not present

## 2023-04-22 DIAGNOSIS — U071 COVID-19: Secondary | ICD-10-CM | POA: Diagnosis not present

## 2023-04-22 DIAGNOSIS — I4892 Unspecified atrial flutter: Secondary | ICD-10-CM | POA: Diagnosis not present

## 2023-04-25 DIAGNOSIS — N183 Chronic kidney disease, stage 3 unspecified: Secondary | ICD-10-CM | POA: Diagnosis not present

## 2023-04-25 DIAGNOSIS — Z8616 Personal history of COVID-19: Secondary | ICD-10-CM | POA: Diagnosis not present

## 2023-04-25 DIAGNOSIS — I480 Paroxysmal atrial fibrillation: Secondary | ICD-10-CM | POA: Diagnosis not present

## 2023-04-28 DIAGNOSIS — U071 COVID-19: Secondary | ICD-10-CM | POA: Diagnosis not present

## 2023-04-28 DIAGNOSIS — I5022 Chronic systolic (congestive) heart failure: Secondary | ICD-10-CM | POA: Diagnosis not present

## 2023-04-28 DIAGNOSIS — N178 Other acute kidney failure: Secondary | ICD-10-CM | POA: Diagnosis not present

## 2023-04-28 DIAGNOSIS — D381 Neoplasm of uncertain behavior of trachea, bronchus and lung: Secondary | ICD-10-CM | POA: Diagnosis not present

## 2023-04-29 DIAGNOSIS — E871 Hypo-osmolality and hyponatremia: Secondary | ICD-10-CM | POA: Diagnosis not present

## 2023-05-03 DIAGNOSIS — N183 Chronic kidney disease, stage 3 unspecified: Secondary | ICD-10-CM | POA: Diagnosis not present

## 2023-05-03 DIAGNOSIS — E871 Hypo-osmolality and hyponatremia: Secondary | ICD-10-CM | POA: Diagnosis not present

## 2023-05-03 DIAGNOSIS — M79662 Pain in left lower leg: Secondary | ICD-10-CM | POA: Diagnosis not present

## 2023-05-04 DIAGNOSIS — M1990 Unspecified osteoarthritis, unspecified site: Secondary | ICD-10-CM | POA: Diagnosis not present

## 2023-05-05 DIAGNOSIS — I4892 Unspecified atrial flutter: Secondary | ICD-10-CM | POA: Diagnosis not present

## 2023-05-05 DIAGNOSIS — M1000 Idiopathic gout, unspecified site: Secondary | ICD-10-CM | POA: Diagnosis not present

## 2023-05-06 DIAGNOSIS — M1000 Idiopathic gout, unspecified site: Secondary | ICD-10-CM | POA: Diagnosis not present

## 2023-05-06 DIAGNOSIS — I4892 Unspecified atrial flutter: Secondary | ICD-10-CM | POA: Diagnosis not present

## 2023-05-06 DIAGNOSIS — I10 Essential (primary) hypertension: Secondary | ICD-10-CM | POA: Diagnosis not present

## 2023-05-07 DIAGNOSIS — Z9889 Other specified postprocedural states: Secondary | ICD-10-CM | POA: Diagnosis not present

## 2023-05-07 DIAGNOSIS — I5020 Unspecified systolic (congestive) heart failure: Secondary | ICD-10-CM | POA: Diagnosis not present

## 2023-05-07 DIAGNOSIS — G8918 Other acute postprocedural pain: Secondary | ICD-10-CM | POA: Diagnosis not present

## 2023-05-07 DIAGNOSIS — C7949 Secondary malignant neoplasm of other parts of nervous system: Secondary | ICD-10-CM | POA: Diagnosis not present

## 2023-05-07 DIAGNOSIS — M4802 Spinal stenosis, cervical region: Secondary | ICD-10-CM | POA: Diagnosis not present

## 2023-05-07 DIAGNOSIS — G893 Neoplasm related pain (acute) (chronic): Secondary | ICD-10-CM | POA: Diagnosis not present

## 2023-05-07 DIAGNOSIS — C781 Secondary malignant neoplasm of mediastinum: Secondary | ICD-10-CM | POA: Diagnosis not present

## 2023-05-07 DIAGNOSIS — I4892 Unspecified atrial flutter: Secondary | ICD-10-CM | POA: Diagnosis not present

## 2023-05-07 DIAGNOSIS — M84452A Pathological fracture, left femur, initial encounter for fracture: Secondary | ICD-10-CM | POA: Diagnosis not present

## 2023-05-07 DIAGNOSIS — I4891 Unspecified atrial fibrillation: Secondary | ICD-10-CM | POA: Diagnosis not present

## 2023-05-07 DIAGNOSIS — I959 Hypotension, unspecified: Secondary | ICD-10-CM | POA: Diagnosis not present

## 2023-05-07 DIAGNOSIS — Z515 Encounter for palliative care: Secondary | ICD-10-CM | POA: Diagnosis not present

## 2023-05-07 DIAGNOSIS — E861 Hypovolemia: Secondary | ICD-10-CM | POA: Diagnosis not present

## 2023-05-07 DIAGNOSIS — M79605 Pain in left leg: Secondary | ICD-10-CM | POA: Diagnosis not present

## 2023-05-07 DIAGNOSIS — R918 Other nonspecific abnormal finding of lung field: Secondary | ICD-10-CM | POA: Diagnosis not present

## 2023-05-07 DIAGNOSIS — M898X5 Other specified disorders of bone, thigh: Secondary | ICD-10-CM | POA: Diagnosis not present

## 2023-05-07 DIAGNOSIS — C801 Malignant (primary) neoplasm, unspecified: Secondary | ICD-10-CM | POA: Diagnosis not present

## 2023-05-07 DIAGNOSIS — C7951 Secondary malignant neoplasm of bone: Secondary | ICD-10-CM | POA: Diagnosis not present

## 2023-05-07 DIAGNOSIS — R1902 Left upper quadrant abdominal swelling, mass and lump: Secondary | ICD-10-CM | POA: Diagnosis not present

## 2023-05-07 DIAGNOSIS — J811 Chronic pulmonary edema: Secondary | ICD-10-CM | POA: Diagnosis not present

## 2023-05-07 DIAGNOSIS — I130 Hypertensive heart and chronic kidney disease with heart failure and stage 1 through stage 4 chronic kidney disease, or unspecified chronic kidney disease: Secondary | ICD-10-CM | POA: Diagnosis not present

## 2023-05-07 DIAGNOSIS — C419 Malignant neoplasm of bone and articular cartilage, unspecified: Secondary | ICD-10-CM | POA: Diagnosis not present

## 2023-05-07 DIAGNOSIS — M84452D Pathological fracture, left femur, subsequent encounter for fracture with routine healing: Secondary | ICD-10-CM | POA: Diagnosis not present

## 2023-05-07 DIAGNOSIS — K4090 Unilateral inguinal hernia, without obstruction or gangrene, not specified as recurrent: Secondary | ICD-10-CM | POA: Diagnosis not present

## 2023-05-07 DIAGNOSIS — J9601 Acute respiratory failure with hypoxia: Secondary | ICD-10-CM | POA: Diagnosis not present

## 2023-05-07 DIAGNOSIS — M79652 Pain in left thigh: Secondary | ICD-10-CM | POA: Diagnosis not present

## 2023-05-07 DIAGNOSIS — C3490 Malignant neoplasm of unspecified part of unspecified bronchus or lung: Secondary | ICD-10-CM | POA: Diagnosis not present

## 2023-05-07 DIAGNOSIS — M84552A Pathological fracture in neoplastic disease, left femur, initial encounter for fracture: Secondary | ICD-10-CM | POA: Diagnosis not present

## 2023-05-07 DIAGNOSIS — J90 Pleural effusion, not elsewhere classified: Secondary | ICD-10-CM | POA: Diagnosis not present

## 2023-05-07 DIAGNOSIS — R197 Diarrhea, unspecified: Secondary | ICD-10-CM | POA: Diagnosis not present

## 2023-05-07 DIAGNOSIS — M1612 Unilateral primary osteoarthritis, left hip: Secondary | ICD-10-CM | POA: Diagnosis not present

## 2023-05-07 DIAGNOSIS — Z0389 Encounter for observation for other suspected diseases and conditions ruled out: Secondary | ICD-10-CM | POA: Diagnosis not present

## 2023-05-07 DIAGNOSIS — C786 Secondary malignant neoplasm of retroperitoneum and peritoneum: Secondary | ICD-10-CM | POA: Diagnosis not present

## 2023-05-07 DIAGNOSIS — C7801 Secondary malignant neoplasm of right lung: Secondary | ICD-10-CM | POA: Diagnosis not present

## 2023-05-07 DIAGNOSIS — N189 Chronic kidney disease, unspecified: Secondary | ICD-10-CM | POA: Diagnosis not present

## 2023-05-07 DIAGNOSIS — R079 Chest pain, unspecified: Secondary | ICD-10-CM | POA: Diagnosis not present

## 2023-05-07 DIAGNOSIS — I5023 Acute on chronic systolic (congestive) heart failure: Secondary | ICD-10-CM | POA: Diagnosis not present

## 2023-05-07 DIAGNOSIS — S79002A Unspecified physeal fracture of upper end of left femur, initial encounter for closed fracture: Secondary | ICD-10-CM | POA: Diagnosis not present

## 2023-05-07 DIAGNOSIS — C7802 Secondary malignant neoplasm of left lung: Secondary | ICD-10-CM | POA: Diagnosis not present

## 2023-05-08 DIAGNOSIS — I5020 Unspecified systolic (congestive) heart failure: Secondary | ICD-10-CM | POA: Diagnosis not present

## 2023-05-08 DIAGNOSIS — M1612 Unilateral primary osteoarthritis, left hip: Secondary | ICD-10-CM | POA: Diagnosis not present

## 2023-05-08 DIAGNOSIS — M4802 Spinal stenosis, cervical region: Secondary | ICD-10-CM | POA: Diagnosis not present

## 2023-05-08 DIAGNOSIS — E861 Hypovolemia: Secondary | ICD-10-CM | POA: Diagnosis not present

## 2023-05-08 DIAGNOSIS — R197 Diarrhea, unspecified: Secondary | ICD-10-CM | POA: Diagnosis not present

## 2023-05-08 DIAGNOSIS — K4090 Unilateral inguinal hernia, without obstruction or gangrene, not specified as recurrent: Secondary | ICD-10-CM | POA: Diagnosis not present

## 2023-05-08 DIAGNOSIS — S79002A Unspecified physeal fracture of upper end of left femur, initial encounter for closed fracture: Secondary | ICD-10-CM | POA: Diagnosis not present

## 2023-05-08 DIAGNOSIS — M84452A Pathological fracture, left femur, initial encounter for fracture: Secondary | ICD-10-CM | POA: Diagnosis not present

## 2023-05-08 DIAGNOSIS — C801 Malignant (primary) neoplasm, unspecified: Secondary | ICD-10-CM | POA: Diagnosis not present

## 2023-05-08 DIAGNOSIS — C7951 Secondary malignant neoplasm of bone: Secondary | ICD-10-CM | POA: Diagnosis not present

## 2023-05-08 DIAGNOSIS — I4892 Unspecified atrial flutter: Secondary | ICD-10-CM | POA: Diagnosis not present

## 2023-05-08 DIAGNOSIS — I959 Hypotension, unspecified: Secondary | ICD-10-CM | POA: Diagnosis not present

## 2023-05-09 ENCOUNTER — Telehealth: Payer: Self-pay

## 2023-05-09 DIAGNOSIS — I5020 Unspecified systolic (congestive) heart failure: Secondary | ICD-10-CM | POA: Diagnosis not present

## 2023-05-09 DIAGNOSIS — I4892 Unspecified atrial flutter: Secondary | ICD-10-CM | POA: Diagnosis not present

## 2023-05-09 DIAGNOSIS — C3490 Malignant neoplasm of unspecified part of unspecified bronchus or lung: Secondary | ICD-10-CM | POA: Diagnosis not present

## 2023-05-09 DIAGNOSIS — M84452A Pathological fracture, left femur, initial encounter for fracture: Secondary | ICD-10-CM | POA: Diagnosis not present

## 2023-05-09 DIAGNOSIS — R197 Diarrhea, unspecified: Secondary | ICD-10-CM | POA: Diagnosis not present

## 2023-05-09 DIAGNOSIS — M84552A Pathological fracture in neoplastic disease, left femur, initial encounter for fracture: Secondary | ICD-10-CM | POA: Diagnosis not present

## 2023-05-09 DIAGNOSIS — E861 Hypovolemia: Secondary | ICD-10-CM | POA: Diagnosis not present

## 2023-05-09 NOTE — Transitions of Care (Post Inpatient/ED Visit) (Unsigned)
   05/09/2023  Name: Christopher [name] MRN: [id number] DOB: Jan 13, 1946  Today's TOC FU Call Status: Today's TOC FU Call Status:: Unsuccessful Call (1st Attempt) Unsuccessful Call (1st Attempt) Date: 05/09/23  Attempted to reach the patient regarding the most recent Inpatient/ED visit.  Follow Up Plan: Additional outreach attempts will be made to reach the patient to complete the Transitions of Care (Post Inpatient/ED visit) call.   Signature Karena Addison, LPN [facility] Nurse Health Advisor Direct Dial [phone]

## 2023-05-10 DIAGNOSIS — M84552A Pathological fracture in neoplastic disease, left femur, initial encounter for fracture: Secondary | ICD-10-CM | POA: Diagnosis not present

## 2023-05-10 DIAGNOSIS — J811 Chronic pulmonary edema: Secondary | ICD-10-CM | POA: Diagnosis not present

## 2023-05-10 DIAGNOSIS — C801 Malignant (primary) neoplasm, unspecified: Secondary | ICD-10-CM | POA: Diagnosis not present

## 2023-05-10 DIAGNOSIS — J90 Pleural effusion, not elsewhere classified: Secondary | ICD-10-CM | POA: Diagnosis not present

## 2023-05-10 DIAGNOSIS — R918 Other nonspecific abnormal finding of lung field: Secondary | ICD-10-CM | POA: Diagnosis not present

## 2023-05-10 DIAGNOSIS — R1902 Left upper quadrant abdominal swelling, mass and lump: Secondary | ICD-10-CM | POA: Diagnosis not present

## 2023-05-10 DIAGNOSIS — C7951 Secondary malignant neoplasm of bone: Secondary | ICD-10-CM | POA: Diagnosis not present

## 2023-05-10 DIAGNOSIS — Z0389 Encounter for observation for other suspected diseases and conditions ruled out: Secondary | ICD-10-CM | POA: Diagnosis not present

## 2023-05-10 NOTE — Transitions of Care (Post Inpatient/ED Visit) (Unsigned)
   05/10/2023  Name: Christopher [name] MRN: [id number] DOB: [birth year]  Today's TOC FU Call Status: Today's TOC FU Call Status:: Unsuccessful Call (2nd Attempt) Unsuccessful Call (1st Attempt) Date: 05/09/23 Unsuccessful Call (2nd Attempt) Date: 05/10/23  Attempted to reach the patient regarding the most recent Inpatient/ED visit.  Follow Up Plan: Additional outreach attempts will be made to reach the patient to complete the Transitions of Care (Post Inpatient/ED visit) call.   Signature Karena Addison, LPN [facility] Nurse Health Advisor Direct Dial [phone]

## 2023-05-11 DIAGNOSIS — Z515 Encounter for palliative care: Secondary | ICD-10-CM | POA: Diagnosis not present

## 2023-05-11 DIAGNOSIS — M84552A Pathological fracture in neoplastic disease, left femur, initial encounter for fracture: Secondary | ICD-10-CM | POA: Diagnosis not present

## 2023-05-11 DIAGNOSIS — R918 Other nonspecific abnormal finding of lung field: Secondary | ICD-10-CM | POA: Diagnosis not present

## 2023-05-11 DIAGNOSIS — J90 Pleural effusion, not elsewhere classified: Secondary | ICD-10-CM | POA: Diagnosis not present

## 2023-05-11 DIAGNOSIS — M898X5 Other specified disorders of bone, thigh: Secondary | ICD-10-CM | POA: Diagnosis not present

## 2023-05-11 DIAGNOSIS — C419 Malignant neoplasm of bone and articular cartilage, unspecified: Secondary | ICD-10-CM | POA: Diagnosis not present

## 2023-05-11 DIAGNOSIS — M84452A Pathological fracture, left femur, initial encounter for fracture: Secondary | ICD-10-CM | POA: Diagnosis not present

## 2023-05-11 DIAGNOSIS — G8918 Other acute postprocedural pain: Secondary | ICD-10-CM | POA: Diagnosis not present

## 2023-05-11 DIAGNOSIS — J811 Chronic pulmonary edema: Secondary | ICD-10-CM | POA: Diagnosis not present

## 2023-05-11 DIAGNOSIS — C801 Malignant (primary) neoplasm, unspecified: Secondary | ICD-10-CM | POA: Diagnosis not present

## 2023-05-11 DIAGNOSIS — C7951 Secondary malignant neoplasm of bone: Secondary | ICD-10-CM | POA: Diagnosis not present

## 2023-05-11 DIAGNOSIS — G893 Neoplasm related pain (acute) (chronic): Secondary | ICD-10-CM | POA: Diagnosis not present

## 2023-05-12 DIAGNOSIS — Z9889 Other specified postprocedural states: Secondary | ICD-10-CM | POA: Diagnosis not present

## 2023-05-12 DIAGNOSIS — G893 Neoplasm related pain (acute) (chronic): Secondary | ICD-10-CM | POA: Diagnosis not present

## 2023-05-12 DIAGNOSIS — M84452D Pathological fracture, left femur, subsequent encounter for fracture with routine healing: Secondary | ICD-10-CM | POA: Diagnosis not present

## 2023-05-12 DIAGNOSIS — R197 Diarrhea, unspecified: Secondary | ICD-10-CM | POA: Diagnosis not present

## 2023-05-12 DIAGNOSIS — I4892 Unspecified atrial flutter: Secondary | ICD-10-CM | POA: Diagnosis not present

## 2023-05-12 DIAGNOSIS — M84452A Pathological fracture, left femur, initial encounter for fracture: Secondary | ICD-10-CM | POA: Diagnosis not present

## 2023-05-12 DIAGNOSIS — E861 Hypovolemia: Secondary | ICD-10-CM | POA: Diagnosis not present

## 2023-05-12 DIAGNOSIS — I959 Hypotension, unspecified: Secondary | ICD-10-CM | POA: Diagnosis not present

## 2023-05-12 DIAGNOSIS — I5020 Unspecified systolic (congestive) heart failure: Secondary | ICD-10-CM | POA: Diagnosis not present

## 2023-05-12 NOTE — Transitions of Care (Post Inpatient/ED Visit) (Signed)
   05/12/2023  Name: SUJAY GRUNDMAN III MRN: [id number] DOB: 1945-12-09  Today's TOC FU Call Status: Today's TOC FU Call Status:: Unsuccessful Call (3rd Attempt) Unsuccessful Call (1st Attempt) Date: 05/09/23 Unsuccessful Call (2nd Attempt) Date: 05/10/23 Unsuccessful Call (3rd Attempt) Date: 05/12/23  Attempted to reach the patient regarding the most recent Inpatient/ED visit.  Follow Up Plan: No further outreach attempts will be made at this time. We have been unable to contact the patient.  Signature Karena Addison, LPN [facility] Nurse Health Advisor Direct Dial [phone]

## 2023-05-13 DIAGNOSIS — N189 Chronic kidney disease, unspecified: Secondary | ICD-10-CM | POA: Diagnosis not present

## 2023-05-13 DIAGNOSIS — E861 Hypovolemia: Secondary | ICD-10-CM | POA: Diagnosis not present

## 2023-05-13 DIAGNOSIS — I5020 Unspecified systolic (congestive) heart failure: Secondary | ICD-10-CM | POA: Diagnosis not present

## 2023-05-13 DIAGNOSIS — I4892 Unspecified atrial flutter: Secondary | ICD-10-CM | POA: Diagnosis not present

## 2023-05-13 DIAGNOSIS — R197 Diarrhea, unspecified: Secondary | ICD-10-CM | POA: Diagnosis not present

## 2023-05-13 DIAGNOSIS — C801 Malignant (primary) neoplasm, unspecified: Secondary | ICD-10-CM | POA: Diagnosis not present

## 2023-05-13 DIAGNOSIS — Z515 Encounter for palliative care: Secondary | ICD-10-CM | POA: Diagnosis not present

## 2023-05-13 DIAGNOSIS — M84552A Pathological fracture in neoplastic disease, left femur, initial encounter for fracture: Secondary | ICD-10-CM | POA: Diagnosis not present

## 2023-05-13 DIAGNOSIS — G893 Neoplasm related pain (acute) (chronic): Secondary | ICD-10-CM | POA: Diagnosis not present

## 2023-05-13 DIAGNOSIS — M84452A Pathological fracture, left femur, initial encounter for fracture: Secondary | ICD-10-CM | POA: Diagnosis not present

## 2023-06-01 ENCOUNTER — Telehealth: Payer: Self-pay | Admitting: Family Medicine

## 2023-06-01 NOTE — Telephone Encounter (Signed)
 Copied from CRM [id number]. Topic: Medicare AWV >> Jun 01, 2023 10:14 AM Payton Doughty wrote: Reason for CRM: Called LVM 06/01/2023 to schedule AWV. Please schedule office or virtual visits.  Verlee Rossetti; Care Guide Ambulatory Clinical Support [place] [facility] Direct Dial: [phone]

## 2023-06-14 DEATH — deceased

## 2023-06-24 ENCOUNTER — Telehealth: Payer: Self-pay | Admitting: Family Medicine

## 2023-06-24 NOTE — Telephone Encounter (Signed)
 Copied from CRM [id number]. Topic: General - Other >> Jun 24, 2023 11:16 AM Gery Pray wrote: Reason for CRM: Patient's daughter calling regarding paperwork for insurance company for Dr. Elizabeth Sauer to fill out due to patient's passing. Please contact [place] at [phone]

## 2023-06-24 NOTE — Telephone Encounter (Signed)
 Called pt left VM to call back. Dr. Yetta Barre can not fill out the paperwork. Dr. Yetta Barre was not his last primary care provider. Pt seen Christopher [name] was the last seen primary care. The forms need to be filled out by most recent primary care provider. Forms will be up front for pick up.  KP

## 2023-06-24 NOTE — Telephone Encounter (Signed)
 Pt's daughter called to report that he was seen by doctors in the hospital, she says "Dr. Yetta Barre is his only PCP"  Best contact: [phone]

## 2023-06-27 NOTE — Telephone Encounter (Signed)
 Dr.Jones spoke to daughter Luther Saltness and discussed that she only saw him for follow up from ED in July 2024 and treated him in past for cholesterol. The daughter will investigate other records to assist in findings to report on paperwork. The papers are with clinical staff at this time. We will hold them until she calls back and Dr Rochelle Chu has more information. Patient passed away May 31, 2023.

## 2023-07-07 ENCOUNTER — Telehealth: Payer: Self-pay

## 2023-07-07 NOTE — Telephone Encounter (Signed)
 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 Dr Rochelle Chu filled out form for life insurance for daughter stating we only treated him for pneumonia and cholesterol. We can not get involved in back and forth if this paper is denied since patient was treated by [facility] at time of death. Scanned copy to chart.
